# Patient Record
Sex: Male | Born: 1940 | Race: White | Hispanic: No | Marital: Married | Smoking: Former smoker
Health system: Southern US, Community
[De-identification: ages and names within clinical notes are randomized; demographics above are authoritative.]

## PROBLEM LIST (undated history)

## (undated) DIAGNOSIS — N289 Disorder of kidney and ureter, unspecified: Secondary | ICD-10-CM

## (undated) DIAGNOSIS — K644 Residual hemorrhoidal skin tags: Secondary | ICD-10-CM

## (undated) DIAGNOSIS — M109 Gout, unspecified: Secondary | ICD-10-CM

## (undated) DIAGNOSIS — L509 Urticaria, unspecified: Secondary | ICD-10-CM

## (undated) DIAGNOSIS — M25569 Pain in unspecified knee: Secondary | ICD-10-CM

## (undated) DIAGNOSIS — R0609 Other forms of dyspnea: Secondary | ICD-10-CM

## (undated) DIAGNOSIS — M1711 Unilateral primary osteoarthritis, right knee: Secondary | ICD-10-CM

## (undated) DIAGNOSIS — Z9981 Dependence on supplemental oxygen: Secondary | ICD-10-CM

## (undated) DIAGNOSIS — N183 Chronic kidney disease, stage 3 unspecified: Secondary | ICD-10-CM

## (undated) DIAGNOSIS — E119 Type 2 diabetes mellitus without complications: Secondary | ICD-10-CM

## (undated) DIAGNOSIS — N400 Enlarged prostate without lower urinary tract symptoms: Secondary | ICD-10-CM

## (undated) DIAGNOSIS — M79609 Pain in unspecified limb: Secondary | ICD-10-CM

## (undated) DIAGNOSIS — R7309 Other abnormal glucose: Secondary | ICD-10-CM

## (undated) DIAGNOSIS — E785 Hyperlipidemia, unspecified: Secondary | ICD-10-CM

## (undated) DIAGNOSIS — Z96652 Presence of left artificial knee joint: Secondary | ICD-10-CM

## (undated) DIAGNOSIS — H538 Other visual disturbances: Secondary | ICD-10-CM

## (undated) DIAGNOSIS — L2089 Other atopic dermatitis: Secondary | ICD-10-CM

## (undated) DIAGNOSIS — R21 Rash and other nonspecific skin eruption: Secondary | ICD-10-CM

## (undated) DIAGNOSIS — I10 Essential (primary) hypertension: Secondary | ICD-10-CM

## (undated) DIAGNOSIS — G4730 Sleep apnea, unspecified: Secondary | ICD-10-CM

## (undated) DIAGNOSIS — J9611 Chronic respiratory failure with hypoxia: Secondary | ICD-10-CM

## (undated) DIAGNOSIS — G4733 Obstructive sleep apnea (adult) (pediatric): Secondary | ICD-10-CM

## (undated) DIAGNOSIS — J849 Interstitial pulmonary disease, unspecified: Secondary | ICD-10-CM

## (undated) DIAGNOSIS — E669 Obesity, unspecified: Secondary | ICD-10-CM

## (undated) DIAGNOSIS — T7840XA Allergy, unspecified, initial encounter: Secondary | ICD-10-CM

## (undated) DIAGNOSIS — M1990 Unspecified osteoarthritis, unspecified site: Secondary | ICD-10-CM

## (undated) HISTORY — DX: Pain in unspecified knee: M25.569

## (undated) HISTORY — DX: Essential (primary) hypertension: I10

## (undated) HISTORY — DX: Obstructive sleep apnea (adult) (pediatric): G47.33

## (undated) HISTORY — DX: Type 2 diabetes mellitus without complications: E11.9

## (undated) HISTORY — DX: Other abnormal glucose: R73.09

## (undated) HISTORY — DX: Other atopic dermatitis: L20.89

## (undated) HISTORY — DX: Rash and other nonspecific skin eruption: R21

## (undated) HISTORY — DX: Benign prostatic hyperplasia without lower urinary tract symptoms: N40.0

## (undated) HISTORY — DX: Urticaria, unspecified: L50.9

## (undated) HISTORY — DX: Other visual disturbances: H53.8

## (undated) HISTORY — DX: Unspecified osteoarthritis, unspecified site: M19.90

## (undated) HISTORY — DX: Presence of left artificial knee joint: Z96.652

## (undated) HISTORY — DX: Residual hemorrhoidal skin tags: K64.4

## (undated) HISTORY — DX: Allergy, unspecified, initial encounter: T78.40XA

## (undated) HISTORY — DX: Sleep apnea, unspecified: G47.30

## (undated) HISTORY — DX: Chronic kidney disease, stage 3 unspecified: N18.30

## (undated) HISTORY — DX: Chronic respiratory failure with hypoxia: J96.11

## (undated) HISTORY — PX: CATARACT EXTRACTION: SUR2

## (undated) HISTORY — DX: Pain in unspecified limb: M79.609

## (undated) HISTORY — DX: Obesity, unspecified: E66.9

## (undated) HISTORY — DX: Interstitial pulmonary disease, unspecified: J84.9

## (undated) HISTORY — DX: Gout, unspecified: M10.9

## (undated) HISTORY — DX: Chronic kidney disease, stage 3 (moderate): N18.3

## (undated) HISTORY — DX: Hyperlipidemia, unspecified: E78.5

## (undated) HISTORY — DX: Other forms of dyspnea: R06.09

## (undated) HISTORY — DX: Unilateral primary osteoarthritis, right knee: M17.11

## (undated) HISTORY — DX: Disorder of kidney and ureter, unspecified: N28.9

## (undated) HISTORY — PX: JOINT REPLACEMENT: SHX530

---

## 2005-12-16 ENCOUNTER — Ambulatory Visit (HOSPITAL_BASED_OUTPATIENT_CLINIC_OR_DEPARTMENT_OTHER): Admission: RE | Admit: 2005-12-16 | Discharge: 2005-12-16 | Payer: Self-pay | Admitting: Emergency Medicine

## 2005-12-22 ENCOUNTER — Ambulatory Visit: Payer: Self-pay | Admitting: Internal Medicine

## 2007-06-21 ENCOUNTER — Emergency Department (HOSPITAL_COMMUNITY): Admission: EM | Admit: 2007-06-21 | Discharge: 2007-06-21 | Payer: Self-pay | Admitting: Emergency Medicine

## 2007-06-23 ENCOUNTER — Emergency Department (HOSPITAL_COMMUNITY): Admission: EM | Admit: 2007-06-23 | Discharge: 2007-06-23 | Payer: Self-pay | Admitting: Emergency Medicine

## 2008-06-03 ENCOUNTER — Encounter: Admission: RE | Admit: 2008-06-03 | Discharge: 2008-06-03 | Payer: Self-pay | Admitting: *Deleted

## 2008-07-14 ENCOUNTER — Encounter: Admission: RE | Admit: 2008-07-14 | Discharge: 2008-07-14 | Payer: Self-pay | Admitting: Internal Medicine

## 2008-09-10 ENCOUNTER — Encounter: Admission: RE | Admit: 2008-09-10 | Discharge: 2008-09-10 | Payer: Self-pay | Admitting: Internal Medicine

## 2010-09-15 NOTE — Procedures (Signed)
NAMEWEBB, WEED               ACCOUNT NO.:  [id number]   MEDICAL RECORD NO.:  [id number]          PATIENT TYPE:  OUT   LOCATION:  SLEEP CENTER                 FACILITY:  [facility]   PHYSICIAN:  Clinton D. Maple Hudson, MD, FCCP, FACPDATE OF BIRTH:  01-23-1940   DATE OF STUDY:  12/16/2005                              NOCTURNAL POLYSOMNOGRAM   REFERRING PHYSICIAN:  Dr. Leslee Home   INDICATION FOR STUDY:  Hypersomnia with sleep apnea.   EPWORTH SLEEPINESS SCORE:  13/24, BMI 38.  Weight 195 pounds.   HOME MEDICATION:  1. Detrol.  2. Syntest h.s.  3. Diclofenac.  4. Claritin.  5. Vitamins.  6. Caltrate.   SLEEP ARCHITECTURE:  Total sleep time 327 minutes with sleep efficiency 75%.  Stage I was 10%, stage II 68%, stages III and IV were absent.  REM 22% of  total sleep time.  Sleep latency 3 minutes, REM latency 71 minutes, awake  after sleep onset 93 minutes.  Arousal index increased at 45.8.  No bedtime  medication was reported.   RESPIRATORY DATA:  Split study protocol.  Apnea/hypopnea index (AHI, RDI)  103 obstructive events per hour indicating severe obstructive sleep apnea  before CPAP.  This included 219 obstructive apneas and 7 hypopneas before  CPAP.  Events were significantly more common while supine, but also present  in lateral sleep positions.  REM AHI 25.3.  CPAP was titrated to 14 CWP, AHI  4.1 per hour.  A medium full-face Mirage Quattro mask was used with a heated  humidifier.   OXYGEN DATA:  Moderately loud snoring with oxygen desaturation to a nadir of  59% before CPAP.  After CPAP control saturation held 95-96% on room air.   CARDIAC DATA:  Sinus bradycardia with heart rate 43-55 beats per minute.  No  significant ectopics.   MOVEMENTS/PARASOMNIA:  Occasional limb jerk with insignificant effect on  sleep.   IMPRESSION/RECOMMENDATIONS:  1. Severe obstructive sleep apnea/hypopnea syndrome, AHI 103 per hour with      events more common while supine but also noted  in lateral sleep      positions.  Moderately loud snoring with oxygen desaturation to a nadir      of 59%.  2. Successful CPAP titration to 14 CWP, AHI 4.1 per hour.  A medium full-      face Mirage Quattro mask was used with a heated humidifier.      Clinton D. Maple Hudson, MD, [facility], FACP  Diplomate, Biomedical engineer of Sleep Medicine  Electronically Signed     CDY/MEDQ  D:  12/22/2005 14:14:57  T:  12/23/2005 23:26:17  Job:  [id number]

## 2010-09-28 ENCOUNTER — Ambulatory Visit
Admission: RE | Admit: 2010-09-28 | Discharge: 2010-09-28 | Disposition: A | Discharge status: Home / Self Care | Payer: Medicare Other | Source: Ambulatory Visit | Attending: Internal Medicine | Admitting: Internal Medicine

## 2010-09-28 ENCOUNTER — Other Ambulatory Visit: Payer: Self-pay | Admitting: Internal Medicine

## 2010-09-28 DIAGNOSIS — M25562 Pain in left knee: Secondary | ICD-10-CM

## 2010-11-07 ENCOUNTER — Encounter: Payer: Medicare Other | Admitting: Oncology

## 2011-08-27 ENCOUNTER — Other Ambulatory Visit (HOSPITAL_BASED_OUTPATIENT_CLINIC_OR_DEPARTMENT_OTHER): Payer: Self-pay | Admitting: Internal Medicine

## 2011-08-27 DIAGNOSIS — N1330 Unspecified hydronephrosis: Secondary | ICD-10-CM

## 2011-08-29 ENCOUNTER — Ambulatory Visit
Admission: RE | Admit: 2011-08-29 | Discharge: 2011-08-29 | Disposition: A | Discharge status: Home / Self Care | Payer: Medicare Other | Source: Ambulatory Visit | Attending: Internal Medicine | Admitting: Internal Medicine

## 2011-08-29 DIAGNOSIS — N1330 Unspecified hydronephrosis: Secondary | ICD-10-CM

## 2012-10-02 ENCOUNTER — Ambulatory Visit (INDEPENDENT_AMBULATORY_CARE_PROVIDER_SITE_OTHER): Payer: Medicare Other | Admitting: Internal Medicine

## 2012-10-02 ENCOUNTER — Encounter: Payer: Self-pay | Admitting: Internal Medicine

## 2012-10-02 VITALS — BP 140/80 | HR 72 | Temp 98.2°F | Resp 18 | Ht 65.0 in | Wt 176.0 lb

## 2012-10-02 DIAGNOSIS — R21 Rash and other nonspecific skin eruption: Secondary | ICD-10-CM | POA: Insufficient documentation

## 2012-10-02 HISTORY — DX: Rash and other nonspecific skin eruption: R21

## 2012-10-02 MED ORDER — PREDNISONE (PAK) 10 MG PO TABS: 10.0000 mg | ORAL_TABLET | Freq: Every day | ORAL | Status: DC

## 2012-10-02 NOTE — Progress Notes (Signed)
Patient ID: Stanley Sullivan, male   DOB: 1940/09/05, 72 y.o.   MRN: [id number]  No Known Allergies  Chief Complaint  Patient presents with  . Acute Visit    rash all over for one week - since cutting grass    HPI: Patient is a 72 y.o.  seen in the office today for an acute visit due to a rash. No problems in [place] with dry dusty environment Several years ago (3-4)  had allergy testing when got rash all over with severe itching and swelling of eyes No findings  Put grass with hands into bag after mowing This time allergy medicine--altrec otc did not work this time  Review of Systems:  Review of Systems  Constitutional: Negative for fever, chills and malaise/fatigue.  HENT: Negative for sore throat.        Hoarse, had swelling of tongue  Eyes: Negative for blurred vision.       Puffiness of eyes  Respiratory: Negative for cough, shortness of breath and wheezing.   Cardiovascular: Negative for chest pain and palpitations.  Gastrointestinal: Negative for heartburn.  Skin: Positive for itching and rash.  Neurological: Negative for loss of consciousness and headaches.  Endo/Heme/Allergies: Positive for environmental allergies.     Past Medical History  Diagnosis Date  . Osteoarthrosis, unspecified whether generalized or localized, unspecified site   . Pain in joint, lower leg   . Chronic kidney disease, stage III (moderate)   . Other specified visual disturbances   . Unspecified disorder of kidney and ureter   . Other abnormal glucose   . Obesity, unspecified   . Urticaria, unspecified   . Allergy, unspecified not elsewhere classified   . Pain in limb   . Gout, unspecified   . Other and unspecified hyperlipidemia   . External hemorrhoids without mention of complication   . Organic sleep apnea, unspecified   . Unspecified essential hypertension   . Hypertrophy of prostate without urinary obstruction and other lower urinary tract symptoms (LUTS)   . Other atopic dermatitis  and related conditions    No past surgical history on file. Social History:   reports that he has never smoked. He does not have any smokeless tobacco history on file. His alcohol and drug histories are not on file.  History reviewed. No pertinent family history.  Medications: Patient's Medications  New Prescriptions   No medications on file  Previous Medications   ACETAMINOPHEN (TYLENOL) 500 MG TABLET    Take 500 mg by mouth every 6 (six) hours as needed for pain.   ALLOPURINOL (ZYLOPRIM) 100 MG TABLET    Take 100 mg by mouth 2 (two) times daily.   ATENOLOL (TENORMIN) 100 MG TABLET       CLOBETASOL OINTMENT (TEMOVATE) 0.05 %    Apply 1 application topically 2 (two) times daily.   COLCHICINE 0.6 MG TABLET    Take 0.6 mg by mouth daily. Take one tablet twice daily for gout.   FLUTICASONE (FLONASE) 50 MCG/ACT NASAL SPRAY    Place 1 spray into the nose 2 (two) times daily.   FUROSEMIDE (LASIX) 20 MG TABLET       LIDOCAINE (LIDODERM) 5 %    Place 1 patch onto the skin daily. Remove & Discard patch within 12 hours or as directed by MD   SIMVASTATIN (ZOCOR) 40 MG TABLET    Take 40 mg by mouth every evening.  Modified Medications   No medications on file  Discontinued Medications   No  medications on file   Physical Exam:  Filed Vitals:   10/02/12 0835  BP: 140/80  Pulse: 72  Temp: 98.2 F (36.8 C)  TempSrc: Oral  Resp: 18  Height: 5\' 5"  (1.651 m)  Weight: 176 lb (79.833 kg)  SpO2: 90%   Physical Exam  Constitutional: He is oriented to person, place, and time. He appears well-developed and well-nourished. No distress.  HENT:  Head: Normocephalic and atraumatic.  Eyes: Pupils are equal, round, and reactive to light.  Cardiovascular: Normal rate, regular rhythm, normal heart sounds and intact distal pulses.   Pulmonary/Chest: Effort normal and breath sounds normal. He has no wheezes.  Abdominal: Bowel sounds are normal.  Musculoskeletal: Normal range of motion.   Lymphadenopathy:    He has no cervical adenopathy.  Neurological: He is alert and oriented to person, place, and time.  Skin: Skin is warm and dry. Rash noted.  Maculopapular rash of lower aspect of back, abdomen, extremities, neck, swelling of lips has dissipated, periocular edema, no adenopathy, no oral lesions, evidence of excoriation in areas he can reach  Psychiatric: He has a normal mood and affect. His behavior is normal. Judgment and thought content normal.   Assessment/Plan 1. Maculopapular rash, generalized - Ambulatory referral to Allergy - predniSONE (STERAPRED UNI-PAK) 10 MG tablet; Take 1 tablet (10 mg total) by mouth daily. Use as directed on package  Dispense: 21 tablet; Refill: 0

## 2012-10-02 NOTE — Patient Instructions (Signed)
We have arranged an appointment for you with [facility], but they could not see you today and wanted you to be off antihistamines for at least 3 days beforehand.  I will treat you with a prednisone taper which we faxed to your pharmacy.

## 2012-10-02 NOTE — Assessment & Plan Note (Signed)
Had previous allergy testing 3-4 years ago after a similar episode.  Has been using otc antihistamine without benefit this time.  Was arranged for visit with [facility].  Will be off antihistamines for at least 3 days prior.  I will begin him on a prednisone taper with 10mg  tablets due to the severity of this reaction in the meantime.

## 2012-11-12 ENCOUNTER — Other Ambulatory Visit: Payer: Self-pay | Admitting: Internal Medicine

## 2012-12-10 ENCOUNTER — Other Ambulatory Visit: Payer: Medicare Other

## 2012-12-10 ENCOUNTER — Other Ambulatory Visit: Payer: Self-pay | Admitting: *Deleted

## 2012-12-10 DIAGNOSIS — I10 Essential (primary) hypertension: Secondary | ICD-10-CM

## 2012-12-10 DIAGNOSIS — N400 Enlarged prostate without lower urinary tract symptoms: Secondary | ICD-10-CM

## 2012-12-10 DIAGNOSIS — E785 Hyperlipidemia, unspecified: Secondary | ICD-10-CM

## 2012-12-11 ENCOUNTER — Encounter: Payer: Self-pay | Admitting: *Deleted

## 2012-12-11 LAB — CBC WITH DIFFERENTIAL/PLATELET
Basophils Absolute: 0 10*3/uL (ref 0.0–0.2)
Basos: 0 % (ref 0–3)
Eos: 4 % (ref 0–5)
Eosinophils Absolute: 0.4 10*3/uL (ref 0.0–0.4)
HCT: 43.5 % (ref 37.5–51.0)
Hemoglobin: 13.9 g/dL (ref 12.6–17.7)
Immature Grans (Abs): 0 10*3/uL (ref 0.0–0.1)
Immature Granulocytes: 0 % (ref 0–2)
Lymphocytes Absolute: 2.2 10*3/uL (ref 0.7–3.1)
Lymphs: 26 % (ref 14–46)
MCH: 23.3 pg — ABNORMAL LOW (ref 26.6–33.0)
MCHC: 32 g/dL (ref 31.5–35.7)
MCV: 73 fL — ABNORMAL LOW (ref 79–97)
Monocytes Absolute: 0.7 10*3/uL (ref 0.1–0.9)
Monocytes: 9 % (ref 4–12)
Neutrophils Absolute: 5.1 10*3/uL (ref 1.4–7.0)
Neutrophils Relative %: 61 % (ref 40–74)
RBC: 5.97 x10E6/uL — ABNORMAL HIGH (ref 4.14–5.80)
RDW: 16 % — ABNORMAL HIGH (ref 12.3–15.4)
WBC: 8.4 10*3/uL (ref 3.4–10.8)

## 2012-12-11 LAB — COMPREHENSIVE METABOLIC PANEL
ALT: 6 IU/L (ref 0–44)
AST: 14 IU/L (ref 0–40)
Albumin/Globulin Ratio: 1.5 (ref 1.1–2.5)
Albumin: 3.7 g/dL (ref 3.5–4.8)
Alkaline Phosphatase: 58 IU/L (ref 39–117)
BUN/Creatinine Ratio: 12 (ref 10–22)
BUN: 21 mg/dL (ref 8–27)
CO2: 23 mmol/L (ref 18–29)
Calcium: 9 mg/dL (ref 8.6–10.2)
Chloride: 104 mmol/L (ref 97–108)
Creatinine, Ser: 1.74 mg/dL — ABNORMAL HIGH (ref 0.76–1.27)
GFR calc Af Amer: 44 mL/min/{1.73_m2} — ABNORMAL LOW (ref >59)
GFR calc non Af Amer: 38 mL/min/{1.73_m2} — ABNORMAL LOW (ref >59)
Globulin, Total: 2.5 g/dL (ref 1.5–4.5)
Glucose: 94 mg/dL (ref 65–99)
Potassium: 5 mmol/L (ref 3.5–5.2)
Sodium: 140 mmol/L (ref 134–144)
Total Bilirubin: 0.4 mg/dL (ref 0.0–1.2)
Total Protein: 6.2 g/dL (ref 6.0–8.5)

## 2012-12-11 LAB — LIPID PANEL
Chol/HDL Ratio: 3.8 ratio units (ref 0.0–5.0)
Cholesterol, Total: 165 mg/dL (ref 100–199)
HDL: 44 mg/dL (ref >39)
LDL Calculated: 109 mg/dL — ABNORMAL HIGH (ref 0–99)
Triglycerides: 61 mg/dL (ref 0–149)
VLDL Cholesterol Cal: 12 mg/dL (ref 5–40)

## 2012-12-11 LAB — PSA: PSA: 2.8 ng/mL (ref 0.0–4.0)

## 2012-12-12 ENCOUNTER — Encounter: Payer: Self-pay | Admitting: Internal Medicine

## 2012-12-12 ENCOUNTER — Encounter: Payer: Self-pay | Admitting: *Deleted

## 2012-12-12 ENCOUNTER — Ambulatory Visit (INDEPENDENT_AMBULATORY_CARE_PROVIDER_SITE_OTHER): Payer: Medicare Other | Admitting: Internal Medicine

## 2012-12-12 VITALS — BP 140/68 | HR 58 | Temp 97.4°F | Resp 18 | Ht 67.0 in | Wt 175.6 lb

## 2012-12-12 DIAGNOSIS — I10 Essential (primary) hypertension: Secondary | ICD-10-CM | POA: Insufficient documentation

## 2012-12-12 DIAGNOSIS — M1710 Unilateral primary osteoarthritis, unspecified knee: Secondary | ICD-10-CM

## 2012-12-12 DIAGNOSIS — IMO0002 Reserved for concepts with insufficient information to code with codable children: Secondary | ICD-10-CM

## 2012-12-12 DIAGNOSIS — M1990 Unspecified osteoarthritis, unspecified site: Secondary | ICD-10-CM

## 2012-12-12 DIAGNOSIS — M109 Gout, unspecified: Secondary | ICD-10-CM

## 2012-12-12 DIAGNOSIS — E785 Hyperlipidemia, unspecified: Secondary | ICD-10-CM | POA: Insufficient documentation

## 2012-12-12 DIAGNOSIS — N183 Chronic kidney disease, stage 3 unspecified: Secondary | ICD-10-CM

## 2012-12-12 DIAGNOSIS — E669 Obesity, unspecified: Secondary | ICD-10-CM

## 2012-12-12 DIAGNOSIS — M1712 Unilateral primary osteoarthritis, left knee: Secondary | ICD-10-CM

## 2012-12-12 DIAGNOSIS — N400 Enlarged prostate without lower urinary tract symptoms: Secondary | ICD-10-CM

## 2012-12-12 NOTE — Progress Notes (Signed)
Patient ID: Stanley Sullivan, male   DOB: October 24, 1940, 72 y.o.   MRN: [id number] Location:  [facility]  No Known Allergies  Chief Complaint  Patient presents with  . Annual Exam    pains in both knees    HPI: Patient is a 72 y.o. [place] male with a h/o htn, hyperlipidemia, low back pain, osteoarthritis, CKD and BPH was seen in the office today for his annual physical.  He is accompanied by his daughter who interprets some of what I say to him.  He does speak some [place].    An EKG was performed that showed sinus bradycardia at 48.  His pulse was 58 at check-in.  Doing ok.  Has been to [facility].  Had diffuse maculopapular rash--was treated with prednisone taper and had more allergy testing.  No clear allergy identified.  Maybe grass allergy.  He continues to mow grass.  Takes allergy tablet if starts to react.    Left knee and ankle are hurting.  Hurts all of the time.  Cannot walk due to bad pain.  Cannot sit on the floor.  Sometimes worse than others.  Worse if more active.  Sometimes knee will be swollen.  Sometimes tylenol, advil, aleve, etc as needed.  Does get increased pain in lower back bilaterally.  Is worse after up walking for a long time.  Standing long term hurts, also.   Sometimes pain will radiate down his left buttock and external thigh from his back.  Is not taking allopurinol or colchicine.    He is only taking bp (atenolol) and cholesterol pills (simvastatin 40) and flomax because he has trouble starting his stream.  Starts and stops and has to stand for a long time to go.  No pain.  Feels he has to go but cannot go.  Only using the flomax a couple times a week.  Has been to urology a long time ago.  Review of Systems:  Review of Systems  Constitutional: Negative for fever, chills and malaise/fatigue.  Eyes: Negative for blurred vision.  Respiratory: Negative for cough and shortness of breath.   Cardiovascular: Negative for chest  pain, palpitations and leg swelling.  Gastrointestinal: Negative for heartburn, constipation, blood in stool and melena.  Genitourinary: Negative for dysuria.  Musculoskeletal: Positive for back pain and joint pain. Negative for myalgias and falls.  Skin: Negative for rash.  Neurological: Negative for dizziness, focal weakness and headaches.  Endo/Heme/Allergies: Positive for environmental allergies. Does not bruise/bleed easily.  Psychiatric/Behavioral: Negative for depression and memory loss.     Past Medical History  Diagnosis Date  . Osteoarthrosis, unspecified whether generalized or localized, unspecified site   . Pain in joint, lower leg   . Chronic kidney disease, stage III (moderate)   . Other specified visual disturbances   . Unspecified disorder of kidney and ureter   . Other abnormal glucose   . Obesity, unspecified   . Urticaria, unspecified   . Allergy, unspecified not elsewhere classified   . Pain in limb   . Gout, unspecified   . Other and unspecified hyperlipidemia   . External hemorrhoids without mention of complication   . Organic sleep apnea, unspecified   . Unspecified essential hypertension   . Hypertrophy of prostate without urinary obstruction and other lower urinary tract symptoms (LUTS)   . Other atopic dermatitis and related conditions     No past surgical history on file.  Social History:   reports that  he has never smoked. He does not have any smokeless tobacco history on file. His alcohol and drug histories are not on file.  No family history on file.  Medications: Patient's Medications  New Prescriptions   No medications on file  Previous Medications   ACETAMINOPHEN (TYLENOL) 500 MG TABLET    Take 500 mg by mouth every 6 (six) hours as needed for pain. Take 1-2 tablets by mouth every 6-8 hours as needed for pain.   ALLOPURINOL (ZYLOPRIM) 100 MG TABLET    Take 100 mg by mouth 2 (two) times daily. Take 2 tablets twice daily for gout.    AMLODIPINE (NORVASC) 5 MG TABLET    Take 5 mg by mouth daily. Take 1 tablet daily for blood pressure.   ATENOLOL (TENORMIN) 100 MG TABLET    Take 100 mg by mouth daily. Take 1 tablet once daily for BP.   CETIRIZINE (ZYRTEC) 10 MG TABLET    Take 10 mg by mouth daily. Take 1 tablet as needed for allergies.   CLOBETASOL OINTMENT (TEMOVATE) 0.05 %    Apply 1 application topically 2 (two) times daily. Apply twice a day on right leg rash.   COLCHICINE 0.6 MG TABLET    Take 0.6 mg by mouth daily. Take one tablet twice daily for gout.   DEXTROMETHORPHAN-GUAIFENESIN (MUCINEX DM MAXIMUM STRENGTH) 60-1200 MG TB12    Take 1 tablet by mouth every 12 (twelve) hours. Take 1 tablet every 12 hours.   DOCUSATE SODIUM (COLACE) 100 MG CAPSULE    Take 100 mg by mouth 2 (two) times daily. Take 1 capsule by mouth twice daily.   FLUTICASONE (FLONASE) 50 MCG/ACT NASAL SPRAY    Place 1 spray into the nose 2 (two) times daily. One spray in each nostril twice daily.   FUROSEMIDE (LASIX) 20 MG TABLET    Take 20 mg by mouth daily. Take 1 pill by mouth once a day in the morning.   GLUCOSE BLOOD TEST STRIP    1 each by Other route as needed for other. Use as instructed   LANCETS (FREESTYLE) LANCETS    1 each by Other route as needed for other. Use as instructed   LIDOCAINE (LIDODERM) 5 %    Place 1 patch onto the skin daily. Remove & Discard patch within 12 hours or as directed by MD   PREDNISONE (STERAPRED UNI-PAK) 10 MG TABLET    Take 1 tablet (10 mg total) by mouth daily. Use as directed on package   PSEUDOEPHEDRINE-GUAIFENESIN [id number] MG TB12    Take by mouth. Take 1 tablet twice a day.   SENNOSIDES-DOCUSATE SODIUM (SENNA) 8.6-50 MG TABS    Take 1 tablet by mouth 2 (two) times daily. Take 1 tablet twice a daily.   SIMVASTATIN (ZOCOR) 40 MG TABLET    Take 40 mg by mouth every evening. Take 1 tablet daily for cholesterol.   TAMSULOSIN (FLOMAX) 0.4 MG CAPS    TAKE 1 CAPSULE BY MOUTH ONCE A DAY  Modified Medications   No  medications on file  Discontinued Medications   No medications on file     Physical Exam: Filed Vitals:   12/12/12 0854  BP: 140/68  Pulse: 58  Temp: 97.4 F (36.3 C)  TempSrc: Oral  Resp: 18  Height: 5\' 7"  (1.702 m)  Weight: 175 lb 9.6 oz (79.652 kg)  SpO2: 95%  Physical Exam  Constitutional: He is oriented to person, place, and time. He appears well-developed and well-nourished. No distress.  HENT:  Head: Normocephalic and atraumatic.  Right Ear: External ear normal.  Left Ear: External ear normal.  Nose: Nose normal.  Mouth/Throat: Oropharynx is clear and moist. No oropharyngeal exudate.  Eyes: Conjunctivae and EOM are normal. Pupils are equal, round, and reactive to light. No scleral icterus.  Neck: Normal range of motion. Neck supple. No JVD present. No tracheal deviation present. No thyromegaly present.  Cardiovascular: Normal rate, regular rhythm, normal heart sounds and intact distal pulses.   No murmur heard. Pulmonary/Chest: Effort normal and breath sounds normal. No respiratory distress.  Abdominal: Soft. Bowel sounds are normal. He exhibits no distension and no mass. There is no tenderness. There is no rebound and no guarding.  Musculoskeletal: Normal range of motion. He exhibits no edema and no tenderness.  Tenderness of left medial knee and medial ankle, tenderness over sacroiliac joints bilaterally   Lymphadenopathy:    He has no cervical adenopathy.  Neurological: He is alert and oriented to person, place, and time. He has normal reflexes. No cranial nerve deficit. Coordination normal.  Skin: Skin is warm and dry.  Psychiatric: He has a normal mood and affect. His behavior is normal. Judgment and thought content normal.     Labs reviewed: Basic Metabolic Panel:  Recent Labs  09/81/19 0858  NA 140  K 5.0  CL 104  CO2 23  GLUCOSE 94  BUN 21  CREATININE 1.74*  CALCIUM 9.0   Liver Function Tests:  Recent Labs  12/10/12 0858  AST 14  ALT 6   ALKPHOS 58  BILITOT 0.4  PROT 6.2  CBC:  Recent Labs  12/10/12 0858  WBC 8.4  NEUTROABS 5.1  HGB 13.9  HCT 43.5  MCV 73*   Lipid Panel:  Recent Labs  12/10/12 0858  HDL 44  LDLCALC 109*  TRIG 61  CHOLHDL 3.8   Past Procedures: Has had prior knee imaging, but several years ago and I could not actually view the images  Assessment/Plan 1. Chronic kidney disease, stage III (moderate) -stable -avoid nsaids other than baby asa -use tylenol instead for arthritis pain  2. Hypertrophy of prostate without urinary obstruction and other lower urinary tract symptoms (LUTS) - Ambulatory referral to Urology -BPH worsening, very gradual increase in PSA over the past 4 years, but not alarmingly high -has not been adherent with flomax--emphasized importance of daily use to decrease symptoms  3. Unspecified essential hypertension At goal with atenolol alone--may actually do better with a different agent due to bradycardia, but he is asymptomatic from this so continued  4. Other and unspecified hyperlipidemia Lipids just above goal-encouraged to take medication each night  5. Osteoarthritis of left knee -  Worse recently--imaging ordered, may need steroid injection - DG Knee Complete 4 Views Left; Future - DG Ankle Complete Left; Future  6. Gout - has stopped his gout medicines so I will f/u levels - DG Ankle Complete Left; Future - Uric Acid; Future - Sedimentation Rate; Future  Labs/tests ordered:  Uric acid, ESR before next visit or day of is ok Next appt:  1 month

## 2012-12-12 NOTE — Patient Instructions (Addendum)
Please get xrays of left ankle and knee.    This is at [facility] at [address] [facility] [address]. I have also done referral for your difficulty peeing--to urology.

## 2013-01-07 ENCOUNTER — Other Ambulatory Visit: Payer: Self-pay | Admitting: Internal Medicine

## 2013-01-07 ENCOUNTER — Telehealth: Payer: Self-pay

## 2013-01-07 DIAGNOSIS — M1711 Unilateral primary osteoarthritis, right knee: Secondary | ICD-10-CM

## 2013-01-07 DIAGNOSIS — N189 Chronic kidney disease, unspecified: Secondary | ICD-10-CM

## 2013-01-07 NOTE — Telephone Encounter (Signed)
Pt was referred to urology.  There was no [place] urologist at [facility], however.  Also, xrays were ordered of the left knee and ankle b/c that was what he complained of that day.  I thought that his daughter was notified several weeks ago of the urology appt.

## 2013-01-07 NOTE — Telephone Encounter (Signed)
Left message on voicemail for patient/wife with Dr.Reeds comments, patient/wife instructed to call if questions or concerns

## 2013-01-07 NOTE — Telephone Encounter (Signed)
Patient's wife walked into the office to question why patient was not referred to [facility], patient would like a referral at this time. Patient's wife also questions why xray were not ordered for both Knee's/Ankles, patient c/o of Bilateral pain (see ov noted 12/12/12). Patient would like orders placed for the imaging center on [facility]   Dr.Reed please advise

## 2013-01-08 ENCOUNTER — Ambulatory Visit
Admission: RE | Admit: 2013-01-08 | Discharge: 2013-01-08 | Disposition: A | Discharge status: Home / Self Care | Payer: Medicare Other | Source: Ambulatory Visit | Attending: Internal Medicine | Admitting: Internal Medicine

## 2013-01-08 DIAGNOSIS — M1712 Unilateral primary osteoarthritis, left knee: Secondary | ICD-10-CM

## 2013-01-08 DIAGNOSIS — M109 Gout, unspecified: Secondary | ICD-10-CM

## 2013-01-08 DIAGNOSIS — M1711 Unilateral primary osteoarthritis, right knee: Secondary | ICD-10-CM

## 2013-03-04 ENCOUNTER — Encounter: Payer: Self-pay | Admitting: Internal Medicine

## 2013-03-04 ENCOUNTER — Ambulatory Visit (INDEPENDENT_AMBULATORY_CARE_PROVIDER_SITE_OTHER): Payer: Medicare Other | Admitting: Internal Medicine

## 2013-03-04 VITALS — BP 160/76 | HR 65 | Temp 97.1°F | Wt 178.4 lb

## 2013-03-04 DIAGNOSIS — R21 Rash and other nonspecific skin eruption: Secondary | ICD-10-CM

## 2013-03-04 MED ORDER — PREDNISONE 50 MG PO TABS: 50.0000 mg | ORAL_TABLET | Freq: Every day | ORAL | Status: DC

## 2013-03-04 NOTE — Progress Notes (Signed)
Patient ID: Stanley Sullivan, male   DOB: 09-26-1940, 72 y.o.   MRN: [id number]  Chief Complaint  Patient presents with  . Acute Visit    rash all over body x 4-5 days, no changes in body soaps or washing powders    HPI:  72 y.o. male patient is seen in the office today for an acute visit due to a rash. He noticed generalized rash in his arm and trunk 4 days back. He was doing yard work and picking up dry leaves. He had similar rash in June this year and had seen allergy specialist and is taking zyrtec on daily basis He has intense pruritis. The rash has spread to his legs and scalp. No face or mucosal involvement No drainage reported No difficulty breathing or swallowing  Review of Systems:  Constitutional: Negative for fever, chills and malaise/fatigue.  HENT: Negative for sore throat.   Eyes: Negative for blurred vision.   Respiratory: Negative for cough, shortness of breath and wheezing.   Cardiovascular: Negative for chest pain and palpitations.  Gastrointestinal: Negative for heartburn.  Skin: Positive for itching  Neurological: Negative for loss of consciousness and headaches.  Endo/Heme/Allergies: Positive for environmental allergies.   Past Medical History  Diagnosis Date  . Osteoarthrosis, unspecified whether generalized or localized, unspecified site   . Pain in joint, lower leg   . Chronic kidney disease, stage III (moderate)   . Other specified visual disturbances   . Unspecified disorder of kidney and ureter   . Other abnormal glucose   . Obesity, unspecified   . Urticaria, unspecified   . Allergy, unspecified not elsewhere classified   . Pain in limb   . Gout, unspecified   . Other and unspecified hyperlipidemia   . External hemorrhoids without mention of complication   . Organic sleep apnea, unspecified   . Unspecified essential hypertension   . Hypertrophy of prostate without urinary obstruction and other lower urinary tract symptoms (LUTS)   . Other atopic  dermatitis and related conditions    Medication reviewed. See MAR   Physical Exam   BP 160/76  Pulse 65  Temp(Src) 97.1 F (36.2 C) (Oral)  Wt 178 lb 6.4 oz (80.922 kg)  SpO2 96%  Constitutional: He is oriented to person, place, and time. He appears well-developed and well-nourished. No distress.  HENT:   Head: Normocephalic and atraumatic.  Mouth: normal oropharynx, no tongue swelling, no enlarged lymph node Eyes: Pupils are equal, round, and reactive to light.  Cardiovascular: Normal rate, regular rhythm, normal heart sounds and intact distal pulses.   Pulmonary/Chest: Effort normal and breath sounds normal. He has no wheezes, rhonchi Abdominal: Bowel sounds are normal.  Musculoskeletal: Normal range of motion.  Lymphadenopathy:    He has no cervical adenopathy.  Neurological: He is alert and oriented to person, place, and time.  Skin: Skin is warm and dry.  Maculopapular rash on arms, legs, trunk, chest, scalp. No lip swelling. No periorbital and genital area rash. No oral lesions. No evidence of excoriation in areas he can reach  Psychiatric: He has a normal mood and affect. His behavior is normal. Judgment and thought content normal.   Assessment/Plan  Maculopapular rash, generalized Likely from contact dermatitis or poison ivy given development after being outdoor gardening. Will have him on a week course of prednisone 50 mg daily and continue levocetirizine. Reassess if no improvement. Avoid outdoor activities- esp gardening for now

## 2013-03-09 ENCOUNTER — Ambulatory Visit (INDEPENDENT_AMBULATORY_CARE_PROVIDER_SITE_OTHER): Payer: Medicare Other | Admitting: Internal Medicine

## 2013-03-09 ENCOUNTER — Ambulatory Visit: Payer: Medicare Other | Admitting: Internal Medicine

## 2013-03-09 ENCOUNTER — Encounter: Payer: Self-pay | Admitting: Internal Medicine

## 2013-03-09 VITALS — BP 148/86 | HR 53 | Temp 98.1°F | Wt 180.0 lb

## 2013-03-09 DIAGNOSIS — E119 Type 2 diabetes mellitus without complications: Secondary | ICD-10-CM

## 2013-03-09 DIAGNOSIS — R0609 Other forms of dyspnea: Secondary | ICD-10-CM

## 2013-03-09 DIAGNOSIS — J309 Allergic rhinitis, unspecified: Secondary | ICD-10-CM

## 2013-03-09 DIAGNOSIS — L247 Irritant contact dermatitis due to plants, except food: Secondary | ICD-10-CM

## 2013-03-09 DIAGNOSIS — R0600 Dyspnea, unspecified: Secondary | ICD-10-CM

## 2013-03-09 DIAGNOSIS — L255 Unspecified contact dermatitis due to plants, except food: Secondary | ICD-10-CM

## 2013-03-09 MED ORDER — GLUCOSE BLOOD VI STRP: ORAL_STRIP | Status: DC

## 2013-03-09 NOTE — Progress Notes (Signed)
Patient ID: Stanley Sullivan, male   DOB: [birth year], 72 y.o.   MRN: [id number] Location:  [facility]   No Known Allergies  Chief Complaint  Patient presents with  . Allergies    patient c/o of allergies     HPI: Patient is a 72 y.o. male seen in the office today for c/o of allergies. Came in 11/5 for a rash and saw Dr. Glade Lloyd. It was thought that it was contact dermatitis due to poison ivy. Prednisone 50mg  for 7 days was prescribed. He is only taking 3 tablets per day.  They are back today because he voice is raspy/hoarse. Was concerned that it was the new medication. Daughter states that's that this hoarsness has happened before and he had a full workup and they did not find the reason. She believes it might be due to allergies. He does not have the rash today that he did on 11/5--has resolved.   Review of Systems:  Review of Systems  Constitutional: Negative for fever, chills and malaise/fatigue.  HENT: Negative for congestion and sore throat.        Denies difficulty swallowing; hoarse  Respiratory: Positive for shortness of breath. Negative for wheezing.        Denies dyspnea  Cardiovascular: Negative for chest pain and leg swelling.  Gastrointestinal: Negative for abdominal pain.  Genitourinary: Positive for frequency.  Musculoskeletal: Negative for falls.  Skin: Negative for itching and rash.  Psychiatric/Behavioral: Negative for depression.     Past Medical History  Diagnosis Date  . Osteoarthrosis, unspecified whether generalized or localized, unspecified site   . Pain in joint, lower leg   . Chronic kidney disease, stage III (moderate)   . Other specified visual disturbances   . Unspecified disorder of kidney and ureter   . Other abnormal glucose   . Obesity, unspecified   . Urticaria, unspecified   . Allergy, unspecified not elsewhere classified   . Pain in limb   . Gout, unspecified   . Other and unspecified hyperlipidemia    . External hemorrhoids without mention of complication   . Organic sleep apnea, unspecified   . Unspecified essential hypertension   . Hypertrophy of prostate without urinary obstruction and other lower urinary tract symptoms (LUTS)   . Other atopic dermatitis and related conditions     History reviewed. No pertinent past surgical history.  Social History:   reports that he has never smoked. He does not have any smokeless tobacco history on file. His alcohol and drug histories are not on file.  History reviewed. No pertinent family history.  Medications: Patient's Medications  New Prescriptions   No medications on file  Previous Medications   ACETAMINOPHEN (TYLENOL) 500 MG TABLET    Take 500 mg by mouth every 6 (six) hours as needed for pain. Take 1-2 tablets by mouth every 6-8 hours as needed for pain.   ATENOLOL (TENORMIN) 100 MG TABLET    Take 100 mg by mouth daily. Take 1 tablet once daily for BP.   CETIRIZINE (ZYRTEC) 10 MG TABLET    Take 10 mg by mouth daily. Take 1 tablet as needed for allergies.   CLOBETASOL OINTMENT (TEMOVATE) 0.05 %    Apply 1 application topically 2 (two) times daily. Apply twice a day on right leg rash.   DEXTROMETHORPHAN-GUAIFENESIN (MUCINEX DM MAXIMUM STRENGTH) 60-1200 MG TB12    Take 1 tablet by mouth every 12 (twelve) hours. Take 1 tablet every 12 hours.  DOCUSATE SODIUM (COLACE) 100 MG CAPSULE    Take 100 mg by mouth 2 (two) times daily. Take 1 capsule by mouth twice daily.   FLUTICASONE (FLONASE) 50 MCG/ACT NASAL SPRAY    Place 1 spray into the nose 2 (two) times daily. One spray in each nostril twice daily.   GLUCOSE BLOOD TEST STRIP    1 each by Other route as needed for other. Use as instructed   LANCETS (FREESTYLE) LANCETS    1 each by Other route as needed for other. Use as instructed   LEVOCETIRIZINE (XYZAL) 5 MG TABLET    Take 5 mg by mouth every evening.   MOMETASONE (ELOCON) 0.1 % CREAM    Apply 1 application topically daily.   PREDNISONE  (DELTASONE) 50 MG TABLET    Take 1 tablet (50 mg total) by mouth daily with breakfast.   SENNOSIDES-DOCUSATE SODIUM (SENNA) 8.6-50 MG TABS    Take 1 tablet by mouth 2 (two) times daily. Take 1 tablet twice a daily.   SIMVASTATIN (ZOCOR) 40 MG TABLET    Take 40 mg by mouth every evening. Take 1 tablet daily for cholesterol.   TAMSULOSIN (FLOMAX) 0.4 MG CAPS    TAKE 1 CAPSULE BY MOUTH ONCE A DAY  Modified Medications   No medications on file  Discontinued Medications   No medications on file     Physical Exam: Filed Vitals:   03/09/13 1529  BP: 148/86  Pulse: 53  Temp: 98.1 F (36.7 C)  TempSrc: Oral  Weight: 180 lb (81.647 kg)  SpO2: 88%   Physical Exam  Constitutional: He appears well-developed and well-nourished.  HENT:  Head: Normocephalic and atraumatic.  Mouth/Throat: Uvula is midline, oropharynx is clear and moist and mucous membranes are normal.  Voice is hoarse; no pharyngeal erythema, exudate  Eyes: Conjunctivae are normal. Right eye exhibits no discharge. Left eye exhibits no discharge.  Cardiovascular: Normal rate, regular rhythm, normal heart sounds and intact distal pulses.   Pulmonary/Chest: Effort normal and breath sounds normal. No stridor. No respiratory distress.  Musculoskeletal: Normal range of motion.  Lymphadenopathy:    He has no cervical adenopathy.  Neurological: He is alert.  Skin: Skin is warm and dry. No rash noted.  Psychiatric: He has a normal mood and affect.     Labs reviewed: Basic Metabolic Panel:  Recent Labs  45/40/98 0858  NA 140  K 5.0  CL 104  CO2 23  GLUCOSE 94  BUN 21  CREATININE 1.74*  CALCIUM 9.0   Liver Function Tests:  Recent Labs  12/10/12 0858  AST 14  ALT 6  ALKPHOS 58  BILITOT 0.4  PROT 6.2  CBC:  Recent Labs  12/10/12 0858  WBC 8.4  NEUTROABS 5.1  HGB 13.9  HCT 43.5  MCV 73*   Lipid Panel:  Recent Labs  12/10/12 0858  HDL 44  LDLCALC 109*  TRIG 61  CHOLHDL 3.8   Assessment/Plan 1.  Type II or unspecified type diabetes mellitus without mention of complication, not stated as uncontrolled -checks cbgs daily to a couple times a week depending on needs at the time -check hba1c at next regular visit in 3mos - glucose blood test strip; Test glucose daily  Dispense: 100 each; Refill: 3  2. Contact dermatitis and eczema due to plant Resolved with prednisone use 30mg  since last appt, but will now stop to prevent hyperglycemia and due to resolution  3. Allergic rhinitis Advised to stop otc cetirizine as he is on  similar agent through [facility]  4. Dyspnea on exertion -walking up stairs or exerting himself much per daughter, no associated chest pain, but they request to have him looked at by Dr. Jacinto Halim  - Ambulatory referral to Cardiology for a stress test  Labs/tests ordered:  Will do labs next time Next appt:  3 mos

## 2013-03-23 ENCOUNTER — Telehealth: Payer: Self-pay | Admitting: *Deleted

## 2013-03-23 NOTE — Telephone Encounter (Signed)
Spoke to pt's wife, Theresia Majors, she will bring him into the office one day next week for labs as well as schedule a f/u appt.

## 2013-03-23 NOTE — Telephone Encounter (Signed)
Message copied by Waymond Cera on Mon Mar 23, 2013  4:19 PM ------      Message from: [place], [place]      Created: Sun Mar 22, 2013  6:33 PM       Pt did not come if for labs that were ordered when he was seen in May. Please ask his daughter to bring him in for these.  She also did not make his routine follow up appointment.  It seems they only come when there is a problem and then there are numerous things to address.              ----- Message -----         From: SYSTEM         Sent: 03/19/2013  12:09 AM           To: Kermit Balo, DO                   ------

## 2013-03-31 ENCOUNTER — Other Ambulatory Visit: Payer: Medicare Other

## 2013-03-31 DIAGNOSIS — M109 Gout, unspecified: Secondary | ICD-10-CM

## 2013-04-01 LAB — URIC ACID: Uric Acid: 9.5 mg/dL — ABNORMAL HIGH (ref 3.7–8.6)

## 2013-04-01 LAB — SEDIMENTATION RATE: Sed Rate: 10 mm/hr (ref 0–30)

## 2013-04-03 ENCOUNTER — Other Ambulatory Visit: Payer: Self-pay | Admitting: Cardiology

## 2013-04-03 ENCOUNTER — Ambulatory Visit
Admission: RE | Admit: 2013-04-03 | Discharge: 2013-04-03 | Disposition: A | Discharge status: Home / Self Care | Payer: Medicare Other | Source: Ambulatory Visit | Attending: Cardiology | Admitting: Cardiology

## 2013-04-03 ENCOUNTER — Telehealth: Payer: Self-pay

## 2013-04-03 DIAGNOSIS — J8410 Pulmonary fibrosis, unspecified: Secondary | ICD-10-CM

## 2013-04-03 MED ORDER — ALLOPURINOL 100 MG PO TABS: 100.0000 mg | ORAL_TABLET | Freq: Every day | ORAL | Status: DC

## 2013-04-03 NOTE — Telephone Encounter (Signed)
Message copied by Maurice Small on Fri Apr 03, 2013  9:35 AM ------      Message from: [place], [place] L      Created: Thu Apr 02, 2013  4:21 PM       Uric acid level is elevated.  He is at high risk for gout recurrence.  I recommend he take allopurinol 100mg  daily to prevent gouty flares ------

## 2013-04-03 NOTE — Telephone Encounter (Signed)
Spoke with patient's wife, Mrs.Wauneka verbalized understanding of results. RX to be sent to [facility]

## 2013-04-10 ENCOUNTER — Encounter: Payer: Self-pay | Admitting: Internal Medicine

## 2013-04-10 ENCOUNTER — Ambulatory Visit (INDEPENDENT_AMBULATORY_CARE_PROVIDER_SITE_OTHER): Payer: Medicare Other | Admitting: Internal Medicine

## 2013-04-10 VITALS — BP 130/78 | HR 62 | Wt 178.0 lb

## 2013-04-10 DIAGNOSIS — K5900 Constipation, unspecified: Secondary | ICD-10-CM

## 2013-04-10 DIAGNOSIS — K5904 Chronic idiopathic constipation: Secondary | ICD-10-CM

## 2013-04-10 DIAGNOSIS — R0600 Dyspnea, unspecified: Secondary | ICD-10-CM

## 2013-04-10 DIAGNOSIS — M109 Gout, unspecified: Secondary | ICD-10-CM

## 2013-04-10 DIAGNOSIS — R0609 Other forms of dyspnea: Secondary | ICD-10-CM

## 2013-04-10 HISTORY — DX: Dyspnea, unspecified: R06.00

## 2013-04-10 HISTORY — DX: Other forms of dyspnea: R06.09

## 2013-04-10 MED ORDER — LINACLOTIDE 145 MCG PO CAPS: 145.0000 ug | ORAL_CAPSULE | Freq: Every day | ORAL | Status: DC

## 2013-04-10 NOTE — Progress Notes (Signed)
Patient ID: Stanley Sullivan, male   DOB: [birth year], 72 y.o.   MRN: [id number]   Location:  [facility]   No Known Allergies  Chief Complaint  Patient presents with  . Medical Managment of Chronic Issues    Follow-up to discuss labs (copy printed)  . Raised area    Patient c/o raised area near buttocks x 2-3 weeks     HPI: Patient is a 72 y.o. [place] male seen in the office today for f/u of chronic conditions to review labs--had very high uric acid and was not taking allopurinol or colcrys as I recommended. He had too much to drink over thanksgiving and that was trigger last time for gout.    Now has raised area near his buttocks for 2-3 wks.   Discussed creams for hemorrhoids.  Has tried colace, metamucil, miralax, senokot s.  Has tried it all and remains constipated.  Wants to try linzess   Review of Systems:  ROS   Past Medical History  Diagnosis Date  . Osteoarthrosis, unspecified whether generalized or localized, unspecified site   . Pain in joint, lower leg   . Chronic kidney disease, stage III (moderate)   . Other specified visual disturbances   . Unspecified disorder of kidney and ureter   . Other abnormal glucose   . Obesity, unspecified   . Urticaria, unspecified   . Allergy, unspecified not elsewhere classified   . Pain in limb   . Gout, unspecified   . Other and unspecified hyperlipidemia   . External hemorrhoids without mention of complication   . Organic sleep apnea, unspecified   . Unspecified essential hypertension   . Hypertrophy of prostate without urinary obstruction and other lower urinary tract symptoms (LUTS)   . Other atopic dermatitis and related conditions     History reviewed. No pertinent past surgical history.  Social History:   reports that he has never smoked. He does not have any smokeless tobacco history on file. His alcohol and drug histories are not on file.  History reviewed. No pertinent  family history.  Medications: Patient's Medications  New Prescriptions   No medications on file  Previous Medications   ACETAMINOPHEN (TYLENOL) 500 MG TABLET    Take 500 mg by mouth every 6 (six) hours as needed for pain. Take 1-2 tablets by mouth every 6-8 hours as needed for pain.   ALLOPURINOL (ZYLOPRIM) 100 MG TABLET    Take 1 tablet (100 mg total) by mouth daily.   ATENOLOL (TENORMIN) 100 MG TABLET    Take 100 mg by mouth daily. Take 1 tablet once daily for BP.   CETIRIZINE (ZYRTEC) 10 MG TABLET    Take 10 mg by mouth daily. Take 1 tablet as needed for allergies.   CLOBETASOL OINTMENT (TEMOVATE) 0.05 %    Apply 1 application topically 2 (two) times daily. Apply twice a day on right leg rash.   DEXTROMETHORPHAN-GUAIFENESIN (MUCINEX DM MAXIMUM STRENGTH) 60-1200 MG TB12    Take 1 tablet by mouth every 12 (twelve) hours. Take 1 tablet every 12 hours.   DOCUSATE SODIUM (COLACE) 100 MG CAPSULE    Take 100 mg by mouth 2 (two) times daily. Take 1 capsule by mouth twice daily.   FLUTICASONE (FLONASE) 50 MCG/ACT NASAL SPRAY    Place 1 spray into the nose 2 (two) times daily. One spray in each nostril twice daily.   GLUCOSE BLOOD TEST STRIP    Test glucose daily  LANCETS (FREESTYLE) LANCETS    1 each by Other route as needed for other. Use as instructed   LEVOCETIRIZINE (XYZAL) 5 MG TABLET    Take 5 mg by mouth every evening.   MOMETASONE (ELOCON) 0.1 % CREAM    Apply 1 application topically daily.   PREDNISONE (DELTASONE) 50 MG TABLET    Take 1 tablet (50 mg total) by mouth daily with breakfast.   SENNOSIDES-DOCUSATE SODIUM (SENNA) 8.6-50 MG TABS    Take 1 tablet by mouth 2 (two) times daily. Take 1 tablet twice a daily.   SIMVASTATIN (ZOCOR) 40 MG TABLET    Take 40 mg by mouth every evening. Take 1 tablet daily for cholesterol.   TAMSULOSIN (FLOMAX) 0.4 MG CAPS    TAKE 1 CAPSULE BY MOUTH ONCE A DAY  Modified Medications   No medications on file  Discontinued Medications   No medications on  file     Physical Exam: Filed Vitals:   04/10/13 0924  BP: 130/78  Pulse: 62  Weight: 178 lb (80.74 kg)  SpO2: 95%  Physical Exam  Labs reviewed: Basic Metabolic Panel:  Recent Labs  40/98/11 0858  NA 140  K 5.0  CL 104  CO2 23  GLUCOSE 94  BUN 21  CREATININE 1.74*  CALCIUM 9.0   Liver Function Tests:  Recent Labs  12/10/12 0858  AST 14  ALT 6  ALKPHOS 58  BILITOT 0.4  PROT 6.2  CBC:  Recent Labs  12/10/12 0858  WBC 8.4  NEUTROABS 5.1  HGB 13.9  HCT 43.5  MCV 73*   Lipid Panel:  Recent Labs  12/10/12 0858  HDL 44  LDLCALC 109*  TRIG 61  CHOLHDL 3.8   Lab Results  Component Value Date   URICACID 9.5* 03/31/2013   Lab Results  Component Value Date   ESRSEDRATE 10 03/31/2013   Assessment/Plan 1. Gout -advised when labs returned to begin colcrys and allopurinol -colcrys will be only for 6 mos and allopurinol indefinitely -needs to take them daily  2. Chronic idiopathic constipation -explained he must stop other constipation pills and take only the linzess on a daily basis--only stop it if he gets diarrhea -he has failed all other typical treatments - Linaclotide (LINZESS) 145 MCG CAPS capsule; Take 1 capsule (145 mcg total) by mouth daily.  Dispense: 30 capsule; Refill: 3  3. Dyspnea on exertion -has seen Dr. Jacinto Halim and had ekg, echo and now scheduled for stress test--await his notes on this  Next appt:  3 mos

## 2013-04-10 NOTE — Patient Instructions (Signed)
You may use anusol cream for hemorrhoids if they itch or become painful.  Please take the allopurinol, colcrys AND the new linzess medicine DAILY.  They will not work if you only take them as needed.

## 2013-04-13 ENCOUNTER — Ambulatory Visit: Payer: Medicare Other | Admitting: Internal Medicine

## 2013-04-24 ENCOUNTER — Encounter: Payer: Self-pay | Admitting: Internal Medicine

## 2013-05-12 ENCOUNTER — Other Ambulatory Visit: Payer: Self-pay | Admitting: *Deleted

## 2013-05-12 DIAGNOSIS — M25569 Pain in unspecified knee: Secondary | ICD-10-CM

## 2013-05-27 ENCOUNTER — Institutional Professional Consult (permissible substitution): Payer: Medicare Other | Admitting: Internal Medicine

## 2013-05-29 ENCOUNTER — Encounter: Payer: Self-pay | Admitting: Internal Medicine

## 2013-07-01 ENCOUNTER — Telehealth: Payer: Self-pay | Admitting: *Deleted

## 2013-07-01 NOTE — Telephone Encounter (Signed)
LMTCBx1 to reschedule appt that pt cancelled on 05-27-13. Carron CurieJennifer Castillo, CMA

## 2013-07-02 NOTE — Telephone Encounter (Signed)
LMTCBx2. Jennifer Castillo, CMA  

## 2013-07-14 NOTE — Telephone Encounter (Signed)
LMTCBx3. Jayana Kotula, CMA  

## 2013-08-01 ENCOUNTER — Other Ambulatory Visit: Payer: Self-pay | Admitting: Nurse Practitioner

## 2013-08-04 ENCOUNTER — Other Ambulatory Visit: Payer: Self-pay

## 2013-08-04 MED ORDER — ATENOLOL 100 MG PO TABS
ORAL_TABLET | ORAL | Status: DC
Start: 2013-08-04 — End: 2013-08-24

## 2013-08-04 MED ORDER — SIMVASTATIN 40 MG PO TABS: ORAL_TABLET | ORAL | Status: DC

## 2013-08-24 ENCOUNTER — Encounter: Payer: Self-pay | Admitting: Internal Medicine

## 2013-08-24 ENCOUNTER — Ambulatory Visit (INDEPENDENT_AMBULATORY_CARE_PROVIDER_SITE_OTHER): Payer: Medicare Other | Admitting: Internal Medicine

## 2013-08-24 VITALS — BP 150/82 | HR 60 | Temp 97.7°F | Wt 176.8 lb

## 2013-08-24 DIAGNOSIS — K5904 Chronic idiopathic constipation: Secondary | ICD-10-CM

## 2013-08-24 DIAGNOSIS — N400 Enlarged prostate without lower urinary tract symptoms: Secondary | ICD-10-CM | POA: Insufficient documentation

## 2013-08-24 DIAGNOSIS — M1712 Unilateral primary osteoarthritis, left knee: Secondary | ICD-10-CM | POA: Insufficient documentation

## 2013-08-24 DIAGNOSIS — E119 Type 2 diabetes mellitus without complications: Secondary | ICD-10-CM | POA: Insufficient documentation

## 2013-08-24 DIAGNOSIS — R0609 Other forms of dyspnea: Secondary | ICD-10-CM

## 2013-08-24 DIAGNOSIS — Z23 Encounter for immunization: Secondary | ICD-10-CM

## 2013-08-24 DIAGNOSIS — IMO0002 Reserved for concepts with insufficient information to code with codable children: Secondary | ICD-10-CM

## 2013-08-24 DIAGNOSIS — K5900 Constipation, unspecified: Secondary | ICD-10-CM

## 2013-08-24 DIAGNOSIS — R0989 Other specified symptoms and signs involving the circulatory and respiratory systems: Secondary | ICD-10-CM

## 2013-08-24 DIAGNOSIS — I10 Essential (primary) hypertension: Secondary | ICD-10-CM | POA: Insufficient documentation

## 2013-08-24 DIAGNOSIS — R0600 Dyspnea, unspecified: Secondary | ICD-10-CM

## 2013-08-24 DIAGNOSIS — M1710 Unilateral primary osteoarthritis, unspecified knee: Secondary | ICD-10-CM

## 2013-08-24 DIAGNOSIS — M109 Gout, unspecified: Secondary | ICD-10-CM | POA: Insufficient documentation

## 2013-08-24 HISTORY — DX: Benign prostatic hyperplasia without lower urinary tract symptoms: N40.0

## 2013-08-24 HISTORY — DX: Type 2 diabetes mellitus without complications: E11.9

## 2013-08-24 MED ORDER — GLUCOSE BLOOD VI STRP: ORAL_STRIP | Status: DC

## 2013-08-24 MED ORDER — LISINOPRIL 5 MG PO TABS: 5.0000 mg | ORAL_TABLET | Freq: Every day | ORAL | Status: DC

## 2013-08-24 MED ORDER — ATENOLOL 100 MG PO TABS: ORAL_TABLET | ORAL | Status: DC

## 2013-08-24 MED ORDER — ZOSTER VACCINE LIVE 19400 UNT/0.65ML ~~LOC~~ SOLR: 0.6500 mL | Freq: Once | SUBCUTANEOUS | Status: DC

## 2013-08-24 MED ORDER — ALLOPURINOL 100 MG PO TABS: 100.0000 mg | ORAL_TABLET | Freq: Every day | ORAL | Status: DC

## 2013-08-24 MED ORDER — LINACLOTIDE 145 MCG PO CAPS: 145.0000 ug | ORAL_CAPSULE | Freq: Every day | ORAL | Status: DC

## 2013-08-24 MED ORDER — FREESTYLE LANCETS MISC: Status: DC

## 2013-08-24 MED ORDER — SIMVASTATIN 40 MG PO TABS: ORAL_TABLET | ORAL | Status: DC

## 2013-08-24 MED ORDER — TAMSULOSIN HCL 0.4 MG PO CAPS: ORAL_CAPSULE | ORAL | Status: DC

## 2013-08-24 NOTE — Progress Notes (Signed)
Patient ID: Stanley Sullivan, male   DOB: May 28, 1940, 73 y.o.   MRN: [id number]   [facility]  [facility]  Code Status: need to discuss at next routine visit  No Known Allergies  Chief Complaint  Patient presents with  . Medical Management of Chronic Issues    medication management f/u with no recent labs.  Marland Kitchen. other    LT knee pain x 4 months  . Immunizations    print shingles vaccine    HPI: Patient is a 73 y.o. [place] male seen in the office today for medical mgt of chronic illnesses.    Got cortisone shot and did not help him.  Not walking well due to pain.  [facility], [facility].    Still getting the shortness of breath.  Tests were normal with Stanley Sullivan.  Stanley Sullivan is concerned about pulmonary fibrosis so he recommended he see Stanley Sullivan for PFTs.    Allergies have been ok right now.    Has been taking his pills for his BPH for many years.  Problem has been stable.  Is passing urine ok when takes his medication, but does not take regularly though we have discussed that it is meant to be a daily regimen.  Review of Systems:  Review of Systems  Constitutional: Negative for fever, chills and weight loss.  HENT: Positive for congestion.   Eyes: Negative for blurred vision.  Respiratory: Positive for shortness of breath. Negative for cough.   Cardiovascular: Negative for chest pain, palpitations and leg swelling.  Gastrointestinal: Positive for constipation. Negative for abdominal pain.  Genitourinary: Negative for dysuria, urgency and frequency.       Difficulty with stream if misses flomax  Musculoskeletal: Positive for joint pain. Negative for falls and myalgias.  Skin: Negative for rash.  Neurological: Negative for dizziness, loss of consciousness, weakness and headaches.  Endo/Heme/Allergies: Does not bruise/bleed easily.  Psychiatric/Behavioral: Negative for depression and memory loss.     Past Medical  History  Diagnosis Date  . Osteoarthrosis, unspecified whether generalized or localized, unspecified site   . Pain in joint, lower leg   . Chronic kidney disease, stage III (moderate)   . Other specified visual disturbances   . Unspecified disorder of kidney and ureter   . Other abnormal glucose   . Obesity, unspecified   . Urticaria, unspecified   . Allergy, unspecified not elsewhere classified   . Pain in limb   . Gout, unspecified   . Other and unspecified hyperlipidemia   . External hemorrhoids without mention of complication   . Organic sleep apnea, unspecified   . Unspecified essential hypertension   . Hypertrophy of prostate without urinary obstruction and other lower urinary tract symptoms (LUTS)   . Other atopic dermatitis and related conditions     History reviewed. No pertinent past surgical history.  Social History:   reports that he has never smoked. He does not have any smokeless tobacco history on file. His alcohol and drug histories are not on file.  History reviewed. No pertinent family history.  Medications: Patient's Medications  New Prescriptions   No medications on file  Previous Medications   ACETAMINOPHEN (TYLENOL) 500 MG TABLET    Take 500 mg by mouth every 6 (six) hours as needed for pain. Take 1-2 tablets by mouth every 6-8 hours as needed for pain.   ALLOPURINOL (ZYLOPRIM) 100 MG TABLET    Take 1 tablet (100 mg total)  by mouth daily.   ATENOLOL (TENORMIN) 100 MG TABLET    TAKE 1 TABLET BY MOUTH ONCE DAILY FOR BLOOD PRESSURE   CLOBETASOL OINTMENT (TEMOVATE) 0.05 %    Apply 1 application topically 2 (two) times daily. Apply twice a day on right leg rash.   DEXTROMETHORPHAN-GUAIFENESIN (MUCINEX DM MAXIMUM STRENGTH) 60-1200 MG TB12    Take 1 tablet by mouth every 12 (twelve) hours. Take 1 tablet every 12 hours.   FLUTICASONE (FLONASE) 50 MCG/ACT NASAL SPRAY    Place 1 spray into the nose 2 (two) times daily. One spray in each nostril twice daily.    GLUCOSE BLOOD TEST STRIP    Test glucose daily   LANCETS (FREESTYLE) LANCETS    1 each by Other route as needed for other. Use as instructed   LINACLOTIDE (LINZESS) 145 MCG CAPS CAPSULE    Take 1 capsule (145 mcg total) by mouth daily.   MOMETASONE (ELOCON) 0.1 % CREAM    Apply 1 application topically daily.   SIMVASTATIN (ZOCOR) 40 MG TABLET    TAKE 1 TABLET BY MOUTH DAILY FOR CHOLESTEROL   TAMSULOSIN (FLOMAX) 0.4 MG CAPS    TAKE 1 CAPSULE BY MOUTH ONCE A DAY   ZOSTER VACCINE LIVE, PF, (ZOSTAVAX) [id number] UNT/0.65ML INJECTION    Inject 0.65 mLs into the skin once.  Modified Medications   No medications on file  Discontinued Medications   No medications on file     Physical Exam: Filed Vitals:   08/24/13 1014  BP: 150/82  Pulse: 60  Temp: 97.7 F (36.5 C)  TempSrc: Oral  Weight: 176 lb 12.8 oz (80.196 kg)  SpO2: 97%  Physical Exam  Constitutional: He is oriented to person, place, and time. He appears well-developed and well-nourished.  Cardiovascular: Normal rate, regular rhythm, normal heart sounds and intact distal pulses.   Pulmonary/Chest: Effort normal.  Coarse breath sounds throughout  Abdominal: Soft. Bowel sounds are normal. He exhibits no distension and no mass. There is no tenderness.  Musculoskeletal: Normal range of motion. He exhibits tenderness.  Of left knee, small effusion, increased warmth left knee vs. right  Neurological: He is alert and oriented to person, place, and time. He exhibits normal muscle tone.  Skin: Skin is warm and dry.  Psychiatric: He has a normal mood and affect.     Labs reviewed: Basic Metabolic Panel:  Recent Labs  60/45/4008/13/14 0858  NA 140  K 5.0  CL 104  CO2 23  GLUCOSE 94  BUN 21  CREATININE 1.74*  CALCIUM 9.0   Liver Function Tests:  Recent Labs  12/10/12 0858  AST 14  ALT 6  ALKPHOS 58  BILITOT 0.4  PROT 6.2   CBC:  Recent Labs  12/10/12 0858  WBC 8.4  NEUTROABS 5.1  HGB 13.9  HCT 43.5  MCV 73*   Lipid  Panel:  Recent Labs  12/10/12 0858  HDL 44  LDLCALC 109*  TRIG 61  CHOLHDL 3.8    Assessment/Plan 1. Type II or unspecified type diabetes mellitus without mention of complication, not stated as uncontrolled - need to f/u labs as these have not been done since august 2014 (does not usually come for regular visits only when needed--today was first regular visit) - simvastatin (ZOCOR) 40 MG tablet; TAKE 1 TABLET BY MOUTH DAILY FOR CHOLESTEROL  Dispense: 30 tablet; Refill: 5 - glucose blood test strip; Test glucose daily  Dispense: 100 each; Refill: 3 - Lancets (FREESTYLE) lancets; Use as instructed  Dispense: 100 each; Refill: 1 - Hemoglobin A1c - Lipid panel - CBC With differential/Platelet - Comprehensive metabolic panel - lisinopril (PRINIVIL,ZESTRIL) 5 MG tablet; Take 1 tablet (5 mg total) by mouth daily. For blood pressure and diabetes  Dispense: 30 tablet; Refill: 3  2. Dyspnea on exertion - has seen Dr. Jacinto Halim and had normal echo and stress test, but xray with concerns for pulmonary fibrosis -refer to Dr. Arrie Aran missed original appt made by Dr. Verl Dicker office due to trip to [place] - Ambulatory referral to Pulmonology for eval and PFTs  3. Osteoarthritis of left knee -requests to see ortho now--has seen [facility] ortho rheumatologist, Dr. Link Snuffer before - AMB referral to orthopedics due to unresponsiveness to conservative treatments here in the office including cortisone injections at this point--may need cartilage injections at this point -is limiting his ability to exercise and lose weight  4. Chronic idiopathic constipation -persists, eats high fiber diet and still needs linzess - Linaclotide (LINZESS) 145 MCG CAPS capsule; Take 1 capsule (145 mcg total) by mouth daily.  Dispense: 30 capsule; Refill: 3  5. Benign prostatic hypertrophy -conts with flomax, but uses prn despite recommendations for daily use -sees [facility]--can return there if he desires  surgery - tamsulosin (FLOMAX) 0.4 MG CAPS capsule; TAKE 1 CAPSULE BY MOUTH ONCE A DAY  Dispense: 30 capsule; Refill: 6  6. Essential hypertension, benign -bp remains elevated today and his daughter notes that it is as high as 180 systolic at home  -cont tenormin, but add low dose lisinopril if ok with Dr. Zetta Bills who he sees this afternoon--will send my note and left note on pt's AVS and advised daughter to ask if he is ok with this - atenolol (TENORMIN) 100 MG tablet; TAKE 1 TABLET BY MOUTH ONCE DAILY FOR BLOOD PRESSURE  Dispense: 30 tablet; Refill: 5 - lisinopril (PRINIVIL,ZESTRIL) 5 MG tablet; Take 1 tablet (5 mg total) by mouth daily. For blood pressure and diabetes  Dispense: 30 tablet; Refill: 3  7. Gout -no recent flares, cont daily allopurinol - allopurinol (ZYLOPRIM) 100 MG tablet; Take 1 tablet (100 mg total) by mouth daily.  Dispense: 30 tablet; Refill: 5  8. Need for prophylactic vaccination and inoculation against other viral diseases(V04.89) -script provided today to get this at [facility] - zoster vaccine live, PF, (ZOSTAVAX) 16109 UNT/0.65ML injection; Inject 19,400 Units into the skin once.  Dispense: 1 each; Refill: 0  Labs/tests ordered:   Orders Placed This Encounter  Procedures  . Hemoglobin A1c  . Lipid panel    Order Specific Question:  Has the patient fasted?    Answer:  Yes  . CBC With differential/Platelet  . Comprehensive metabolic panel    Order Specific Question:  Has the patient fasted?    Answer:  Yes  . AMB referral to orthopedics    Referral Priority:  Routine    Referral Type:  Consultation    Number of Visits Requested:  1  . Ambulatory referral to Pulmonology    Referral Priority:  Routine    Referral Type:  Consultation    Referral Reason:  Specialty Services Required    Requested Specialty:  Pulmonary Disease    Number of Visits Requested:  1    Next appt:  3 mos to f/u on DMII, HTN, knee

## 2013-08-24 NOTE — Patient Instructions (Signed)
Please check with Dr. Allena KatzPatel about the addition of lisinopril 5mg  to his routine for his blood pressure in the context of diabetes.

## 2013-08-25 ENCOUNTER — Telehealth: Payer: Self-pay | Admitting: *Deleted

## 2013-08-25 LAB — COMPREHENSIVE METABOLIC PANEL
ALT: 6 IU/L (ref 0–44)
AST: 15 IU/L (ref 0–40)
Albumin/Globulin Ratio: 1.6 (ref 1.1–2.5)
Albumin: 3.6 g/dL (ref 3.5–4.8)
Alkaline Phosphatase: 49 IU/L (ref 39–117)
BUN/Creatinine Ratio: 12 (ref 10–22)
BUN: 17 mg/dL (ref 8–27)
CO2: 23 mmol/L (ref 18–29)
Calcium: 8.7 mg/dL (ref 8.6–10.2)
Chloride: 107 mmol/L (ref 97–108)
Creatinine, Ser: 1.43 mg/dL — ABNORMAL HIGH (ref 0.76–1.27)
GFR calc Af Amer: 55 mL/min/{1.73_m2} — ABNORMAL LOW (ref >59)
GFR calc non Af Amer: 48 mL/min/{1.73_m2} — ABNORMAL LOW (ref >59)
Globulin, Total: 2.3 g/dL (ref 1.5–4.5)
Glucose: 97 mg/dL (ref 65–99)
Potassium: 5.4 mmol/L — ABNORMAL HIGH (ref 3.5–5.2)
Sodium: 143 mmol/L (ref 134–144)
Total Bilirubin: 0.8 mg/dL (ref 0.0–1.2)
Total Protein: 5.9 g/dL — ABNORMAL LOW (ref 6.0–8.5)

## 2013-08-25 LAB — LIPID PANEL
Chol/HDL Ratio: 4.4 ratio units (ref 0.0–5.0)
Cholesterol, Total: 201 mg/dL — ABNORMAL HIGH (ref 100–199)
HDL: 46 mg/dL (ref >39)
LDL Calculated: 137 mg/dL — ABNORMAL HIGH (ref 0–99)
Triglycerides: 92 mg/dL (ref 0–149)
VLDL Cholesterol Cal: 18 mg/dL (ref 5–40)

## 2013-08-25 LAB — CBC WITH DIFFERENTIAL
Basophils Absolute: 0 10*3/uL (ref 0.0–0.2)
Basos: 0 %
Eos: 4 %
Eosinophils Absolute: 0.3 10*3/uL (ref 0.0–0.4)
HCT: 43.3 % (ref 37.5–51.0)
Hemoglobin: 14.3 g/dL (ref 12.6–17.7)
Immature Grans (Abs): 0.1 10*3/uL (ref 0.0–0.1)
Immature Granulocytes: 1 %
Lymphocytes Absolute: 1.4 10*3/uL (ref 0.7–3.1)
Lymphs: 18 %
MCH: 24 pg — ABNORMAL LOW (ref 26.6–33.0)
MCHC: 33 g/dL (ref 31.5–35.7)
MCV: 73 fL — ABNORMAL LOW (ref 79–97)
Monocytes Absolute: 0.9 10*3/uL (ref 0.1–0.9)
Monocytes: 12 %
Neutrophils Absolute: 4.9 10*3/uL (ref 1.4–7.0)
Neutrophils Relative %: 65 %
Platelets: 228 10*3/uL (ref 150–379)
RBC: 5.97 x10E6/uL — ABNORMAL HIGH (ref 4.14–5.80)
RDW: 17.3 % — ABNORMAL HIGH (ref 12.3–15.4)
WBC: 7.7 10*3/uL (ref 3.4–10.8)

## 2013-08-25 LAB — HEMOGLOBIN A1C
Est. average glucose Bld gHb Est-mCnc: 151 mg/dL
Hgb A1c MFr Bld: 6.9 % — ABNORMAL HIGH (ref 4.8–5.6)

## 2013-08-25 NOTE — Telephone Encounter (Signed)
Arna Mediciora with [facility] called and requested labs that was drawn yesterday so they don't have to duplicate. Faxed labs to her for patient's appointment. Fax: [phone]

## 2013-09-03 ENCOUNTER — Other Ambulatory Visit: Payer: Self-pay | Admitting: *Deleted

## 2013-09-03 ENCOUNTER — Encounter: Payer: Self-pay | Admitting: *Deleted

## 2013-09-03 MED ORDER — LINAGLIPTIN 5 MG PO TABS: 5.0000 mg | ORAL_TABLET | Freq: Every day | ORAL | Status: DC

## 2013-09-03 NOTE — Telephone Encounter (Signed)
Pt notified VIA phone and rx sent to the pharmacy. 

## 2013-09-28 ENCOUNTER — Emergency Department (HOSPITAL_COMMUNITY)
Admission: EM | Admit: 2013-09-28 | Discharge: 2013-09-28 | Disposition: A | Discharge status: Home / Self Care | Payer: Medicare Other | Source: Home / Self Care | Attending: Emergency Medicine | Admitting: Emergency Medicine

## 2013-09-28 ENCOUNTER — Encounter (HOSPITAL_COMMUNITY): Payer: Self-pay | Admitting: Emergency Medicine

## 2013-09-28 DIAGNOSIS — M549 Dorsalgia, unspecified: Secondary | ICD-10-CM

## 2013-09-28 LAB — POCT URINALYSIS DIP (DEVICE)
Bilirubin Urine: NEGATIVE
Glucose, UA: NEGATIVE mg/dL
HGB URINE DIPSTICK: NEGATIVE
KETONES UR: NEGATIVE mg/dL
Leukocytes, UA: NEGATIVE
Nitrite: NEGATIVE
PH: 6 (ref 5.0–8.0)
PROTEIN: NEGATIVE mg/dL
SPECIFIC GRAVITY, URINE: 1.015 (ref 1.005–1.030)
UROBILINOGEN UA: 0.2 mg/dL (ref 0.0–1.0)

## 2013-09-28 MED ORDER — MELOXICAM 7.5 MG PO TABS: 7.5000 mg | ORAL_TABLET | Freq: Every day | ORAL | Status: DC

## 2013-09-28 NOTE — ED Notes (Addendum)
Pt c/o lower back pain onset 4-5 days Reports pain increases w/activity and when having bowel movements Hx of arthritis and Chronic Kidney disease Denies inj/trauma, urinary problems Alert w/no signs of acute distress; ambulated well to exam room w/NAD

## 2013-09-28 NOTE — ED Provider Notes (Signed)
CSN: [id number]     Arrival date & time 09/28/13  0944 History   First MD Initiated Contact with Patient 09/28/13 1040     Chief Complaint  Patient presents with  . Back Pain   (Consider location/radiation/quality/duration/timing/severity/associated sxs/prior Treatment) HPI Comments: 73 year old male presents for evaluation of back pain. He has had back pain in his lower back for about 4 days. The pain is constant. It is midline and slightly out to the right. It is increased with walking, also he notices a slightly increased pain with having a bowel movement or urinating. He also suffers from chronic constipation as well as BPH. he denies any worsening of these conditions. Denies testicle pain, abdominal pain. Denies any injury. He has never had this before that he can recall. No loss of bowel or bladder control. The pain does not radiate into the legs. He has no numbness or weakness  Patient is a 73 y.o. male presenting with back pain.  Back Pain   Past Medical History  Diagnosis Date  . Osteoarthrosis, unspecified whether generalized or localized, unspecified site   . Pain in joint, lower leg   . Chronic kidney disease, stage III (moderate)   . Other specified visual disturbances   . Unspecified disorder of kidney and ureter   . Other abnormal glucose   . Obesity, unspecified   . Urticaria, unspecified   . Allergy, unspecified not elsewhere classified   . Pain in limb   . Gout, unspecified   . Other and unspecified hyperlipidemia   . External hemorrhoids without mention of complication   . Organic sleep apnea, unspecified   . Unspecified essential hypertension   . Hypertrophy of prostate without urinary obstruction and other lower urinary tract symptoms (LUTS)   . Other atopic dermatitis and related conditions    History reviewed. No pertinent past surgical history. No family history on file. History  Substance Use Topics  . Smoking status: Never Smoker   . Smokeless tobacco:  Not on file  . Alcohol Use: Not on file    Review of Systems  Gastrointestinal: Positive for constipation (Chronic).  Genitourinary:       BPH  Musculoskeletal: Positive for back pain.  All other systems reviewed and are negative.   Allergies  Review of patient's allergies indicates no known allergies.  Home Medications   Prior to Admission medications   Medication Sig Start Date End Date Taking? Authorizing Provider  atenolol (TENORMIN) 100 MG tablet TAKE 1 TABLET BY MOUTH ONCE DAILY FOR BLOOD PRESSURE 08/24/13  Yes Tiffany L Reed, DO  lisinopril (PRINIVIL,ZESTRIL) 5 MG tablet Take 1 tablet (5 mg total) by mouth daily. For blood pressure and diabetes 08/24/13  Yes Tiffany L Reed, DO  tamsulosin (FLOMAX) 0.4 MG CAPS capsule TAKE 1 CAPSULE BY MOUTH ONCE A DAY 08/24/13  Yes Tiffany L Reed, DO  acetaminophen (TYLENOL) 500 MG tablet Take 500 mg by mouth every 6 (six) hours as needed for pain. Take 1-2 tablets by mouth every 6-8 hours as needed for pain.    Historical Provider, MD  allopurinol (ZYLOPRIM) 100 MG tablet Take 1 tablet (100 mg total) by mouth daily. 08/24/13   Tiffany L Reed, DO  clobetasol ointment (TEMOVATE) 0.05 % Apply 1 application topically 2 (two) times daily. Apply twice a day on right leg rash.    Historical Provider, MD  Dextromethorphan-Guaifenesin (MUCINEX DM MAXIMUM STRENGTH) 60-1200 MG TB12 Take 1 tablet by mouth every 12 (twelve) hours. Take 1 tablet every 12  hours.    Historical Provider, MD  fluticasone (FLONASE) 50 MCG/ACT nasal spray Place 1 spray into the nose 2 (two) times daily. One spray in each nostril twice daily.    Historical Provider, MD  glucose blood test strip Test glucose daily 08/24/13   Tiffany L Reed, DO  Lancets (FREESTYLE) lancets Use as instructed 08/24/13   Tiffany L Reed, DO  Linaclotide (LINZESS) 145 MCG CAPS capsule Take 1 capsule (145 mcg total) by mouth daily. 08/24/13   Tiffany L Reed, DO  linagliptin (TRADJENTA) 5 MG TABS tablet Take 1  tablet (5 mg total) by mouth daily. For diabetes 09/03/13   Tiffany L Reed, DO  meloxicam (MOBIC) 7.5 MG tablet Take 1 tablet (7.5 mg total) by mouth daily. 09/28/13   Adrian BlackwaterZachary H Jazminn Pomales, PA-C  mometasone (ELOCON) 0.1 % cream Apply 1 application topically daily.    Historical Provider, MD  simvastatin (ZOCOR) 40 MG tablet TAKE 1 TABLET BY MOUTH DAILY FOR CHOLESTEROL 08/24/13   Tiffany L Reed, DO  zoster vaccine live, PF, (ZOSTAVAX) [id number] UNT/0.65ML injection Inject 19,400 Units into the skin once. 08/24/13   Tiffany L Reed, DO   BP 172/82  Pulse 52  Temp(Src) 97.6 F (36.4 C) (Oral)  Resp 16  SpO2 93% Physical Exam  Nursing note and vitals reviewed. Constitutional: He is oriented to person, place, and time. He appears well-developed and well-nourished. No distress.  HENT:  Head: Normocephalic and atraumatic.  Cardiovascular: Normal rate, regular rhythm and normal heart sounds.   Pulmonary/Chest: Effort normal and breath sounds normal. No respiratory distress.  Abdominal: Soft. Bowel sounds are normal. He exhibits no distension and no mass. There is no tenderness. There is no rebound and no guarding.  Genitourinary: Rectum normal. Prostate is enlarged (mild, symmetric enlargement).  Neurological: He is alert and oriented to person, place, and time. Coordination normal.  Skin: Skin is warm and dry. No rash noted. He is not diaphoretic.  Psychiatric: He has a normal mood and affect. Judgment normal.    ED Course  Procedures (including critical care time) Labs Review Labs Reviewed  POCT URINALYSIS DIP (DEVICE)    Imaging Review No results found.   MDM   1. Back pain    Simple back pain, it is likely exacerbated by urinating or having a bowel movement because he always has to strain when doing this because of BPH and chronic constipation. We'll treat with daily meloxicam. Followup with primary care.  O2 rechecked and was 95% on RA    Meds ordered this encounter  Medications  .  meloxicam (MOBIC) 7.5 MG tablet    Sig: Take 1 tablet (7.5 mg total) by mouth daily.    Dispense:  30 tablet    Refill:  0    Order Specific Question:  Supervising Provider    Answer:  Lorenz CoasterKELLER, DAVID C [[id number]]     Graylon GoodZachary H Trameka Dorough, PA-C 09/28/13 1124

## 2013-09-28 NOTE — Discharge Instructions (Signed)
Back Pain, Adult Low back pain is very common. About 1 in 5 people have back pain.The cause of low back pain is rarely dangerous. The pain often gets better over time.About half of people with a sudden onset of back pain feel better in just 2 weeks. About 8 in 10 people feel better by 6 weeks.  CAUSES Some common causes of back pain include:  Strain of the muscles or ligaments supporting the spine.  Wear and tear (degeneration) of the spinal discs.  Arthritis.  Direct injury to the back. DIAGNOSIS Most of the time, the direct cause of low back pain is not known.However, back pain can be treated effectively even when the exact cause of the pain is unknown.Answering your caregiver's questions about your overall health and symptoms is one of the most accurate ways to make sure the cause of your pain is not dangerous. If your caregiver needs more information, he or she may order lab work or imaging tests (X-rays or MRIs).However, even if imaging tests show changes in your back, this usually does not require surgery. HOME CARE INSTRUCTIONS For many people, back pain returns.Since low back pain is rarely dangerous, it is often a condition that people can learn to manageon their own.   Remain active. It is stressful on the back to sit or stand in one place. Do not sit, drive, or stand in one place for more than 30 minutes at a time. Take short walks on level surfaces as soon as pain allows.Try to increase the length of time you walk each day.  Do not stay in bed.Resting more than 1 or 2 days can delay your recovery.  Do not avoid exercise or work.Your body is made to move.It is not dangerous to be active, even though your back may hurt.Your back will likely heal faster if you return to being active before your pain is gone.  Pay attention to your body when you bend and lift. Many people have less discomfortwhen lifting if they bend their knees, keep the load close to their bodies,and  avoid twisting. Often, the most comfortable positions are those that put less stress on your recovering back.  Find a comfortable position to sleep. Use a firm mattress and lie on your side with your knees slightly bent. If you lie on your back, put a pillow under your knees.  Only take over-the-counter or prescription medicines as directed by your caregiver. Over-the-counter medicines to reduce pain and inflammation are often the most helpful.Your caregiver may prescribe muscle relaxant drugs.These medicines help dull your pain so you can more quickly return to your normal activities and healthy exercise.  Put ice on the injured area.  Put ice in a plastic bag.  Place a towel between your skin and the bag.  Leave the ice on for 15-20 minutes, 03-04 times a day for the first 2 to 3 days. After that, ice and heat may be alternated to reduce pain and spasms.  Ask your caregiver about trying back exercises and gentle massage. This may be of some benefit.  Avoid feeling anxious or stressed.Stress increases muscle tension and can worsen back pain.It is important to recognize when you are anxious or stressed and learn ways to manage it.Exercise is a great option. SEEK MEDICAL CARE IF:  You have pain that is not relieved with rest or medicine.  You have pain that does not improve in 1 week.  You have new symptoms.  You are generally not feeling well. SEEK   IMMEDIATE MEDICAL CARE IF:   You have pain that radiates from your back into your legs.  You develop new bowel or bladder control problems.  You have unusual weakness or numbness in your arms or legs.  You develop nausea or vomiting.  You develop abdominal pain.  You feel faint. Document Released: 04/16/2005 Document Revised: 10/16/2011 Document Reviewed: 09/04/2010 Exit[employer] Patient Information 2014 [employer].  

## 2013-09-29 NOTE — ED Provider Notes (Signed)
Medical screening examination/treatment/procedure(s) were performed by non-physician practitioner and as supervising physician I was immediately available for consultation/collaboration.  Leslee Home, M.D.  Reuben Likes, MD 09/29/13 2220

## 2013-09-30 ENCOUNTER — Telehealth: Payer: Self-pay

## 2013-09-30 NOTE — Telephone Encounter (Signed)
Stanley Sullivan went to the ED/urgent care with low back pain. I had just stopped his nsaids due to his kidney function. They put him back on mobic. Please notify his daughter that he should not take this for more than a week and must hydrate very well. ----- Message ----- From: SYSTEM Sent: 09/28/2013 11:36 AM To: Kermit Balo, DO   Left message on voicemail for patient to return call when available

## 2013-10-01 NOTE — Telephone Encounter (Signed)
Left message on voicemail for patient to return call when available   

## 2013-10-02 NOTE — Telephone Encounter (Signed)
Spoke with Stanley Sullivan, patient only took mobic x 3 days and stopped. Stanley Sullivan read the information on mobic and realized that is was not good for him to take over a long period of time due to kidney function.

## 2013-10-06 ENCOUNTER — Other Ambulatory Visit: Payer: Self-pay | Admitting: *Deleted

## 2013-10-07 ENCOUNTER — Encounter (INDEPENDENT_AMBULATORY_CARE_PROVIDER_SITE_OTHER): Payer: Self-pay

## 2013-10-07 ENCOUNTER — Other Ambulatory Visit (INDEPENDENT_AMBULATORY_CARE_PROVIDER_SITE_OTHER): Payer: Medicare Other

## 2013-10-07 ENCOUNTER — Encounter: Payer: Self-pay | Admitting: Internal Medicine

## 2013-10-07 ENCOUNTER — Ambulatory Visit (INDEPENDENT_AMBULATORY_CARE_PROVIDER_SITE_OTHER): Payer: Medicare Other | Admitting: Internal Medicine

## 2013-10-07 ENCOUNTER — Institutional Professional Consult (permissible substitution): Payer: Medicare Other | Admitting: Internal Medicine

## 2013-10-07 VITALS — BP 130/78 | HR 60 | Ht 65.5 in | Wt 177.2 lb

## 2013-10-07 DIAGNOSIS — J8410 Pulmonary fibrosis, unspecified: Secondary | ICD-10-CM

## 2013-10-07 DIAGNOSIS — R0600 Dyspnea, unspecified: Secondary | ICD-10-CM

## 2013-10-07 DIAGNOSIS — J849 Interstitial pulmonary disease, unspecified: Secondary | ICD-10-CM

## 2013-10-07 DIAGNOSIS — R05 Cough: Secondary | ICD-10-CM

## 2013-10-07 DIAGNOSIS — R0989 Other specified symptoms and signs involving the circulatory and respiratory systems: Secondary | ICD-10-CM

## 2013-10-07 DIAGNOSIS — R0609 Other forms of dyspnea: Secondary | ICD-10-CM

## 2013-10-07 DIAGNOSIS — G4733 Obstructive sleep apnea (adult) (pediatric): Secondary | ICD-10-CM

## 2013-10-07 DIAGNOSIS — R059 Cough, unspecified: Secondary | ICD-10-CM

## 2013-10-07 LAB — SEDIMENTATION RATE: Sed Rate: 13 mm/hr (ref 0–22)

## 2013-10-07 NOTE — Patient Instructions (Addendum)
#  Shortness of breath and cough  - I am concerned you might have Interstitial Lung Disease (ILD) atleast of moderate severity based on exertional desaturation  -  There are > 100 varieties of this -  Set up 2L o2 with exertion - To narrow down possibilities and assess severity please do the following tests  - do full PFT breathing test (choose a location depending on schedule and convenience)  -  do High Resolution CT chest wo contrast (only Dr Rosario Jacks or Dr Weber Cooks to read)  - do autoimmune panel: Serum: ESR, ANA, DS-DNA, RF, anti-CCP, ssA, ssB, scl-70, ANCA, MPO and PR-3 antibodies, Total CK,  Aldolase,  Hypersensitivity Pneumonitis Panel  #Cough   - likely due to high pre test prob of ILD  - ACE inhibitor could be making it worse - will address ACE inhibitor issue at followup   #Rt knee issue  - see Dr Hollace Kinnier, DO   #FOllowup  - Before July 15th, 2015

## 2013-10-07 NOTE — Progress Notes (Signed)
Subjective:    Patient ID: Stanley Sullivan, male    DOB: [birth year], 73 y.o.   MRN: [id number] PCP REED, TIFFANY, DO  HPI  IOV 10/07/2013  Chief Complaint  Patient presents with  . Pulmonary Consult    Referred by Dr. Mariea Clonts for dyspnea on exertion. Denies CP. C/o mild productive cough.    73 year old [place] to the [place]. Living in [place]  since the 1970s and 1980s. 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 . In the 22s and 1970s lived in [place] and worked as a Education officer, environmental and also Surveyor, minerals. He is accompanied by his son who gives a better history due to language issues   for the last few years has had insidious onset of dyspnea associated with cough. With symptoms of progressive especially in the last 6 months particularly dyspnea. This visit it is severe. Relieved by rest. Business noticeable class II to class III levels of activity such as climbing a flight of stairs or walking from the car to the office. There is no associated chest pain. The cough is dry and cousin both a day and night. Cough is associated with ACE inhibitor intake. Symptoms are both associated with acid reflux disease. Is no history of associated collagen vascular disease but he does have gout and he has a right knee brace. There is no obvious postnasal drainage. Son is aware that that might be c concern of interstitial lung disease    Most recently his right infrapatellar area is tender and this has not been reported primary care   Dyspne and ILD Relevant history -  reports that he quit smoking about 20 years ago. His smoking use included Cigarettes. He has a 7.5 pack-year smoking history. He has quit using smokeless tobacco. His smokeless tobacco use included Chew. 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 Denies mold exposure - Chest x-ray December 2014:  Radiologist's concern about the possibility of interstitial lung disease and have suggested CT scan - Creatinine 1.5 mg percent with a GFR of 40 08/05/2013 - No evidence of anemia with a hemoglobin of 14.3 g percent apical doesn't 15 - Nuclear medicine cardiac stress test January 2015 at [facility] cardiovascular: EKG with poor R-wave progression but stress test showed ejection fraction of 57% but no evidence of ischemia or scar at rest and with exertion   Walking desat test 185 feet x 3 laps on RA: Walked very slow and half lap - desat to 84% on RA     Past Medical History  Diagnosis Date  . Osteoarthrosis, unspecified whether generalized or localized, unspecified site   . Pain in joint, lower leg   . Chronic kidney disease, stage III (moderate)   . Other specified visual disturbances   . Unspecified disorder of kidney and ureter   . Other abnormal glucose   . Obesity, unspecified   . Urticaria, unspecified   . Allergy, unspecified not elsewhere classified   . Pain in limb   . Gout, unspecified   . Other and unspecified hyperlipidemia   . External hemorrhoids without mention of complication   . Organic sleep apnea, unspecified   . Unspecified essential hypertension   . Hypertrophy of prostate without urinary obstruction and other lower urinary tract symptoms (LUTS)   . Other atopic dermatitis and related conditions  No family history on file.   History   Social History  . Marital Status: Married    Spouse Name: N/A    Number of Children: N/A  . Years of Education: N/A   Occupational History  . retired    Social History Main Topics  . Smoking status: Former Smoker -- 0.50 packs/day for 15 years    Types: Cigarettes    Quit date: 04/30/1993  . Smokeless tobacco: Former Systems developer    Types: Chew  . Alcohol Use: Yes     Comment: occassional  . Drug Use: No  . Sexual Activity: Not on file   Other Topics Concern  . Not on file   Social History Narrative  . No  narrative on file     No Known Allergies   Outpatient Prescriptions Prior to Visit  Medication Sig Dispense Refill  . acetaminophen (TYLENOL) 500 MG tablet Take 500 mg by mouth every 6 (six) hours as needed for pain. Take 1-2 tablets by mouth every 6-8 hours as needed for pain.      Marland Kitchen allopurinol (ZYLOPRIM) 100 MG tablet Take 1 tablet (100 mg total) by mouth daily.  30 tablet  5  . atenolol (TENORMIN) 100 MG tablet TAKE 1 TABLET BY MOUTH ONCE DAILY FOR BLOOD PRESSURE  30 tablet  5  . Dextromethorphan-Guaifenesin (MUCINEX DM MAXIMUM STRENGTH) 60-1200 MG TB12 Take 1 tablet by mouth every 12 (twelve) hours. Take 1 tablet every 12 hours.      Marland Kitchen glucose blood test strip Test glucose daily  100 each  3  . Lancets (FREESTYLE) lancets Use as instructed  100 each  1  . simvastatin (ZOCOR) 40 MG tablet TAKE 1 TABLET BY MOUTH DAILY FOR CHOLESTEROL  30 tablet  5  . tamsulosin (FLOMAX) 0.4 MG CAPS capsule TAKE 1 CAPSULE BY MOUTH ONCE A DAY  30 capsule  6  . clobetasol ointment (TEMOVATE) 6.23 % Apply 1 application topically 2 (two) times daily. Apply twice a day on right leg rash.      . fluticasone (FLONASE) 50 MCG/ACT nasal spray Place 1 spray into the nose 2 (two) times daily. One spray in each nostril twice daily.      . Linaclotide (LINZESS) 145 MCG CAPS capsule Take 1 capsule (145 mcg total) by mouth daily.  30 capsule  3  . linagliptin (TRADJENTA) 5 MG TABS tablet Take 1 tablet (5 mg total) by mouth daily. For diabetes  30 tablet  3  . lisinopril (PRINIVIL,ZESTRIL) 5 MG tablet Take 1 tablet (5 mg total) by mouth daily. For blood pressure and diabetes  30 tablet  3  . meloxicam (MOBIC) 7.5 MG tablet Take 1 tablet (7.5 mg total) by mouth daily.  30 tablet  0  . mometasone (ELOCON) 0.1 % cream Apply 1 application topically daily.      Marland Kitchen zoster vaccine live, PF, (ZOSTAVAX) 76283 UNT/0.65ML injection Inject 19,400 Units into the skin once.  1 each  0   No facility-administered medications prior to  visit.       Review of Systems  Constitutional: Negative for fever and unexpected weight change.  HENT: Negative for congestion, dental problem, ear pain, nosebleeds, postnasal drip, rhinorrhea, sinus pressure, sneezing, sore throat and trouble swallowing.   Eyes: Negative for redness and itching.  Respiratory: Positive for cough and shortness of breath. Negative for chest tightness and wheezing.   Cardiovascular: Positive for leg swelling. Negative for palpitations.  Gastrointestinal: Negative for nausea and vomiting.  Genitourinary: Negative for dysuria.  Musculoskeletal: Negative for joint swelling.  Skin: Negative for rash.  Neurological: Negative for headaches.  Hematological: Does not bruise/bleed easily.  Psychiatric/Behavioral: Negative for dysphoric mood. The patient is not nervous/anxious.        Objective:   Physical Exam  Nursing note and vitals reviewed. Constitutional: He is oriented to person, place, and time. He appears well-developed and well-nourished. No distress.  Son doing translation  HENT:  Head: Normocephalic and atraumatic.  Right Ear: External ear normal.  Left Ear: External ear normal.  Mouth/Throat: Oropharynx is clear and moist. No oropharyngeal exudate.  Eyes: Conjunctivae and EOM are normal. Pupils are equal, round, and reactive to light. Right eye exhibits no discharge. Left eye exhibits no discharge. No scleral icterus.  Neck: Normal range of motion. Neck supple. No JVD present. No tracheal deviation present. No thyromegaly present.  Cardiovascular: Normal rate, regular rhythm and intact distal pulses.  Exam reveals no gallop and no friction rub.   No murmur heard. Pulmonary/Chest: Effort normal. No respiratory distress. He has no wheezes. He has rales. He exhibits no tenderness.  bibasal crackles +  Abdominal: Soft. Bowel sounds are normal. He exhibits no distension and no mass. There is no tenderness. There is no rebound and no guarding.    Musculoskeletal: Normal range of motion. He exhibits no edema and no tenderness.  Rt knee in brace and below patella is prominence that is tender  Lymphadenopathy:    He has no cervical adenopathy.  Neurological: He is alert and oriented to person, place, and time. He has normal reflexes. No cranial nerve deficit. Coordination normal.  Skin: Skin is warm and dry. No rash noted. He is not diaphoretic. No erythema. No pallor.  Psychiatric: He has a normal mood and affect. His behavior is normal. Judgment and thought content normal.    Filed Vitals:   10/07/13 1343  BP: 130/78  Pulse: 60  Height: 5' 5.5" (1.664 m)  Weight: 177 lb 3.2 oz (80.377 kg)  SpO2: 91%          Assessment & Plan:  Shortness of breath and cough  - I am concerned you might have Interstitial Lung Disease (ILD)  -  There are > 100 varieties of this -  Set up 2L o2 with exertion - To narrow down possibilities and assess severity please do the following tests  - do full PFT breathing test (choose a location depending on schedule and convenience)  -  do High Resolution CT chest wo contrast (only Dr Rosario Jacks or Dr Weber Cooks to read)  - do autoimmune panel: Serum: ESR, ANA, DS-DNA, RF, anti-CCP, ssA, ssB, scl-70, ANCA, MPO and PR-3 antibodies, Total CK,  Aldolase,  Hypersensitivity Pneumonitis Panel  #Cough   - likely due to high pre test prob of ILD  - ACE inhibitor could be making it worse - will address ACE inhibitor issue at followup   #Rt knee issue  - see Dr  Hollace Kinnier, DO   #FOllowup  - Before July 15th, 2015

## 2013-10-08 ENCOUNTER — Encounter (INDEPENDENT_AMBULATORY_CARE_PROVIDER_SITE_OTHER): Payer: Self-pay

## 2013-10-08 ENCOUNTER — Ambulatory Visit (HOSPITAL_COMMUNITY)
Admission: RE | Admit: 2013-10-08 | Discharge: 2013-10-08 | Disposition: A | Discharge status: Home / Self Care | Payer: Medicare Other | Source: Ambulatory Visit | Attending: Internal Medicine | Admitting: Internal Medicine

## 2013-10-08 DIAGNOSIS — Z87891 Personal history of nicotine dependence: Secondary | ICD-10-CM | POA: Insufficient documentation

## 2013-10-08 DIAGNOSIS — R0600 Dyspnea, unspecified: Secondary | ICD-10-CM

## 2013-10-08 DIAGNOSIS — R0609 Other forms of dyspnea: Secondary | ICD-10-CM | POA: Insufficient documentation

## 2013-10-08 DIAGNOSIS — R05 Cough: Secondary | ICD-10-CM

## 2013-10-08 DIAGNOSIS — R059 Cough, unspecified: Secondary | ICD-10-CM

## 2013-10-08 DIAGNOSIS — R0989 Other specified symptoms and signs involving the circulatory and respiratory systems: Principal | ICD-10-CM | POA: Insufficient documentation

## 2013-10-08 LAB — CYCLIC CITRUL PEPTIDE ANTIBODY, IGG: Cyclic Citrullin Peptide Ab: <2 U/mL (ref 0.0–5.0)

## 2013-10-08 LAB — ALDOLASE: ALDOLASE: 5.7 U/L (ref <=8.1)

## 2013-10-08 LAB — CK TOTAL AND CKMB (NOT AT ARMC)
CK, MB: 1.1 ng/mL (ref 0.0–5.0)
Total CK: 45 U/L (ref 7–232)

## 2013-10-08 LAB — RHEUMATOID FACTOR

## 2013-10-08 LAB — ANA: Anti Nuclear Antibody(ANA): NEGATIVE

## 2013-10-08 MED ORDER — ALBUTEROL SULFATE (2.5 MG/3ML) 0.083% IN NEBU
2.5000 mg | INHALATION_SOLUTION | Freq: Once | RESPIRATORY_TRACT | Status: AC
  Administered 2013-10-08: 2.5 mg via RESPIRATORY_TRACT

## 2013-10-09 DIAGNOSIS — J849 Interstitial pulmonary disease, unspecified: Secondary | ICD-10-CM | POA: Insufficient documentation

## 2013-10-09 DIAGNOSIS — G4733 Obstructive sleep apnea (adult) (pediatric): Secondary | ICD-10-CM | POA: Insufficient documentation

## 2013-10-09 HISTORY — DX: Obstructive sleep apnea (adult) (pediatric): G47.33

## 2013-10-09 LAB — MPO/PR-3 (ANCA) ANTIBODIES: Serine Protease 3: <1

## 2013-10-09 LAB — ANTI-DNA ANTIBODY, DOUBLE-STRANDED: DS DNA AB: 1 [IU]/mL

## 2013-10-09 LAB — SJOGRENS SYNDROME-B EXTRACTABLE NUCLEAR ANTIBODY: SSB (LA) (ENA) ANTIBODY, IGG: NEGATIVE

## 2013-10-09 LAB — SJOGRENS SYNDROME-A EXTRACTABLE NUCLEAR ANTIBODY: SSA (RO) (ENA) ANTIBODY, IGG: NEGATIVE

## 2013-10-09 LAB — ANTI-SCLERODERMA ANTIBODY: Scleroderma (Scl-70) (ENA) Antibody, IgG: <1

## 2013-10-09 NOTE — Assessment & Plan Note (Signed)
# #  Cough   - likely due to high pre test prob of ILD  - ACE inhibitor could be making it worse - will address ACE inhibitor issue at followup     #FOllowup  - Before July 15th, 2015

## 2013-10-09 NOTE — Assessment & Plan Note (Signed)
#  Shortness of breath and cough  - I am concerned you might have Interstitial Lung Disease (ILD) atleast of moderate severity based on exertional desaturation  -  There are > 100 varieties of this -  Set up 2L o2 with exertion - To narrow down possibilities and assess severity please do the following tests  - do full PFT breathing test (choose a location depending on schedule and convenience)  -  do High Resolution CT chest wo contrast (only Dr Rosario Jacks or Dr Weber Cooks to read)  - do autoimmune panel: Serum: ESR, ANA, DS-DNA, RF, anti-CCP, ssA, ssB, scl-70, ANCA, MPO and PR-3 antibodies, Total CK,  Aldolase,  Hypersensitivity Pneumonitis Panel

## 2013-10-12 ENCOUNTER — Ambulatory Visit (INDEPENDENT_AMBULATORY_CARE_PROVIDER_SITE_OTHER)
Admission: RE | Admit: 2013-10-12 | Discharge: 2013-10-12 | Disposition: A | Discharge status: Home / Self Care | Payer: Medicare Other | Source: Ambulatory Visit | Attending: Internal Medicine | Admitting: Internal Medicine

## 2013-10-12 ENCOUNTER — Telehealth: Payer: Self-pay | Admitting: Internal Medicine

## 2013-10-12 DIAGNOSIS — R059 Cough, unspecified: Secondary | ICD-10-CM

## 2013-10-12 DIAGNOSIS — R05 Cough: Secondary | ICD-10-CM

## 2013-10-12 DIAGNOSIS — R0609 Other forms of dyspnea: Secondary | ICD-10-CM

## 2013-10-12 DIAGNOSIS — R0989 Other specified symptoms and signs involving the circulatory and respiratory systems: Secondary | ICD-10-CM

## 2013-10-12 DIAGNOSIS — R0600 Dyspnea, unspecified: Secondary | ICD-10-CM

## 2013-10-12 NOTE — Telephone Encounter (Signed)
Rec'd from [facility] forward 4 pages to Dr. Marchelle Gearingamaswamy

## 2013-10-13 LAB — PULMONARY FUNCTION TEST
DL/VA % PRED: 48 %
DL/VA: 2.06 ml/min/mmHg/L
DLCO UNC % PRED: 25 %
DLCO unc: 6.44 ml/min/mmHg
FEF 25-75 POST: 2.28 L/s
FEF 25-75 Pre: 1.86 L/sec
FEF2575-%Change-Post: 22 %
FEF2575-%PRED-PRE: 102 %
FEF2575-%Pred-Post: 125 %
FEV1-%CHANGE-POST: 6 %
FEV1-%Pred-Post: 78 %
FEV1-%Pred-Pre: 73 %
FEV1-Post: 1.94 L
FEV1-Pre: 1.82 L
FEV1FVC-%Change-Post: 5 %
FEV1FVC-%Pred-Pre: 109 %
FEV6-%CHANGE-POST: 0 %
FEV6-%PRED-POST: 71 %
FEV6-%PRED-PRE: 70 %
FEV6-POST: 2.28 L
FEV6-PRE: 2.27 L
FEV6FVC-%PRED-POST: 107 %
FEV6FVC-%PRED-PRE: 107 %
FVC-%Change-Post: 0 %
FVC-%PRED-POST: 66 %
FVC-%PRED-PRE: 65 %
FVC-POST: 2.28 L
FVC-Pre: 2.27 L
Post FEV1/FVC ratio: 85 %
Post FEV6/FVC ratio: 100 %
Pre FEV1/FVC ratio: 80 %
Pre FEV6/FVC Ratio: 100 %
RV % pred: 53 %
RV: 1.21 L
TLC % pred: 60 %
TLC: 3.64 L

## 2013-10-13 LAB — HYPERSENSITIVITY PNUEMONITIS PROFILE

## 2013-11-10 ENCOUNTER — Encounter (INDEPENDENT_AMBULATORY_CARE_PROVIDER_SITE_OTHER): Payer: Self-pay

## 2013-11-10 ENCOUNTER — Telehealth: Payer: Self-pay | Admitting: Internal Medicine

## 2013-11-10 ENCOUNTER — Encounter: Payer: Self-pay | Admitting: Internal Medicine

## 2013-11-10 ENCOUNTER — Ambulatory Visit (INDEPENDENT_AMBULATORY_CARE_PROVIDER_SITE_OTHER): Payer: Medicare Other | Admitting: Internal Medicine

## 2013-11-10 VITALS — BP 132/88 | HR 56 | Ht 66.0 in | Wt 174.0 lb

## 2013-11-10 DIAGNOSIS — R0989 Other specified symptoms and signs involving the circulatory and respiratory systems: Secondary | ICD-10-CM

## 2013-11-10 DIAGNOSIS — R0600 Dyspnea, unspecified: Secondary | ICD-10-CM

## 2013-11-10 DIAGNOSIS — J849 Interstitial pulmonary disease, unspecified: Secondary | ICD-10-CM

## 2013-11-10 DIAGNOSIS — J8410 Pulmonary fibrosis, unspecified: Secondary | ICD-10-CM

## 2013-11-10 DIAGNOSIS — R0609 Other forms of dyspnea: Secondary | ICD-10-CM

## 2013-11-10 NOTE — Patient Instructions (Addendum)
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

## 2013-11-10 NOTE — Progress Notes (Signed)
Subjective:    Patient ID: Stanley Sullivan, male    DOB: 1941-04-24, 73 y.o.   MRN: [id number]  HPI PCP REED, TIFFANY, DO  HPI  IOV 10/07/2013  Chief Complaint  Patient presents with  . Pulmonary Consult    Referred by Dr. Renato Gailseed for dyspnea on exertion. Denies CP. C/o mild productive cough.    73 year old [place]  immigrant to the [place]. Living in [place]  since the 1970s and 1980s. 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 . 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 He is accompanied by his son who gives a better history due to language issues   for the last few years has had insidious onset of dyspnea associated with cough. With symptoms of progressive especially in the last 6 months particularly dyspnea. This visit it is severe. Relieved by rest. Business noticeable class II to class III levels of activity such as climbing a flight of stairs or walking from the car to the office. There is no associated chest pain. The cough is dry and cousin both a day and night. Cough is associated with ACE inhibitor intake. Symptoms are both associated with acid reflux disease. Is no history of associated collagen vascular disease but he does have gout and he has a right knee brace. There is no obvious postnasal drainage. Son is aware that that might be c concern of interstitial lung disease    Most recently his right infrapatellar area is tender and this has not been reported primary care   Dyspne and ILD Relevant history -  reports that he quit smoking about 20 years ago. His smoking use included Cigarettes. He has a 7.5 pack-year smoking history. He has quit using smokeless tobacco. His smokeless tobacco use included Chew. 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 Denies mold exposure - Chest x-ray December 2014:  Radiologist's concern about the possibility of interstitial lung disease and have suggested CT scan - Creatinine 1.5 mg percent with a GFR of 40 08/05/2013 - No evidence of anemia with a hemoglobin of 14.3 g percent apical doesn't 15 - Nuclear medicine cardiac [facility]: EKG with poor R-wave progression but stress test showed ejection fraction of 57% but no evidence of ischemia or scar at rest and with exertion   Walking desat test 185 feet x 3 laps on RA: Walked very slow and half lap - desat to 84% on RA     OV 11/10/2013  Chief Complaint  Patient presents with  . Follow-up    Pt states his breathing is doing a little better than last OV. Pt is able to do more activities without becoming as SOB. Denies SOB and CP/tightness.     Interstitial lung disease workup here to review results. Presents with his son. The patient speaks gujarati and interpretation by son  Results   High-resolution CT scan of the chest 10/12/2013: Show some reticular abnormalities. It is not consistent with UIP. Findings are so subtle that we do not even know if it is possible UIP but is not inconsistent with early UIP. Autoimmune panel 10/07/2048: Is negative suggesting IPF still in mix of differential diagnosis   PFT 10/08/2013 and pulmonary function test c/w moderate to severe severity interstitial lung disease  with an FVC of 2.3 L or 65%. Total lung capacity of 2.6 L or 60%. And DLCO 6.4/25%    Today he states that he actually feels better but objectively he still has crackles and does desaturate on exertion. He also prefers  portable oxygen.   Review of Systems  Constitutional: Negative for fever and unexpected weight change.  HENT: Negative for congestion, dental problem, ear pain, nosebleeds, postnasal drip, rhinorrhea, sinus pressure, sneezing, sore throat and trouble swallowing.   Eyes: Negative for redness and itching.  Respiratory: Positive for shortness  of breath. Negative for cough, chest tightness and wheezing.   Cardiovascular: Negative for palpitations and leg swelling.  Gastrointestinal: Negative for nausea and vomiting.  Genitourinary: Negative for dysuria.  Musculoskeletal: Negative for joint swelling.  Skin: Negative for rash.  Neurological: Negative for headaches.  Hematological: Does not bruise/bleed easily.  Psychiatric/Behavioral: Negative for dysphoric mood. The patient is not nervous/anxious.        Objective:   Physical Exam  Filed Vitals:   11/10/13 0947  BP: 132/88  Pulse: 56  Height: 5\' 6"  (1.676 m)  Weight: 174 lb (78.926 kg)  SpO2: 95%    Discussion only   CRackles still + Deaturated o nwalking 1-2 laps again +      Assessment & Plan:  #You  have Interstitial Lung Disease (ILD) atleast of moderate severity   -  There are > 100 varieties of this but I Am most concerned that you might have IPF -  Set up 2L o2 with exertion; change to portable system today 11/10/2013 - Caues still not known  -REfer Dr Tyrone Sage for surgical lung biopsy as discussed - Thanks for agreeing on research study called BRAVE; will keep you posted  (might not be eligible due to language barrier)   #Cough   - likely due to high pre test prob of ILD  - ACE inhibitor could be making it worse - will address ACE inhibitor issue at followup   #Rt knee issue  - see Dr Renato Gails, TIFFANY, DO   #FOllowup  -3 weeks after surgical lung biopsy

## 2013-11-10 NOTE — Telephone Encounter (Signed)
Hi Ed  I am sending Stanley Sullivan your way for surgical lung biopsy. I am back from vacation 11/28/13 and would like the procedure done aftrer that. His son and he have preliminary agreed for the BRAVE - BAL and transbronchial biopsy research study. So, if you can do this after 8/.1/15 would be great. WE got study through IRB Today but do not have a formal date on study activation yet. I will be in touch with you abut that but hopefully next 2 weeks  I amon email all the time and epic peridocially till I return  Thanks   Dr. Kalman ShanMurali Oshen Wlodarczyk, M.D., [facility] Pulmonary and Critical Care Medicine Staff Physician [facility] [place] [facility] Pager: [phone], If no answer or between  15:00h - 7:00h: call [phone]  11/10/2013 10:17 AM

## 2013-11-12 NOTE — Assessment & Plan Note (Addendum)
#  You  have Interstitial Lung Disease (ILD) atleast of moderate severity   -  There are > 100 varieties of this but most concerning for IPF -  Set up 2L o2 with exertion; change to portable system today 11/10/2013 - Caus still not known  -REfer Dr Tyrone SageGerhardt for surgical lung biopsy as discussed (risks, benefit, limitations discussed) - Thanks for agreeing on research study called BRAVE; will keep you posted   #Cough   - likely due to high pre test prob of ILD  - ACE inhibitor could be making it worse - will address ACE inhibitor issue at followup     #FOllowup  -3 weeks after surgical lung biopsy   > 50% of this > 25 min visit spent in face to face counseling (15 min visit converted to 25 min)

## 2013-11-13 ENCOUNTER — Telehealth: Payer: Self-pay | Admitting: Internal Medicine

## 2013-11-13 ENCOUNTER — Encounter: Payer: Self-pay | Admitting: Cardiothoracic Surgery

## 2013-11-13 ENCOUNTER — Institutional Professional Consult (permissible substitution) (INDEPENDENT_AMBULATORY_CARE_PROVIDER_SITE_OTHER): Payer: Medicare Other | Admitting: Cardiothoracic Surgery

## 2013-11-13 VITALS — BP 164/71 | HR 58 | Resp 20 | Ht 66.0 in | Wt 174.0 lb

## 2013-11-13 DIAGNOSIS — J849 Interstitial pulmonary disease, unspecified: Secondary | ICD-10-CM

## 2013-11-13 DIAGNOSIS — J8410 Pulmonary fibrosis, unspecified: Secondary | ICD-10-CM

## 2013-11-13 DIAGNOSIS — R0609 Other forms of dyspnea: Principal | ICD-10-CM

## 2013-11-13 NOTE — Telephone Encounter (Signed)
[facility] calling to clarify an order that was placed at last OV Order states "Set up 2L o2 with exertion; change to portable system"  Pt already has portable system at home.  Did Dr Marchelle Gearingamaswamy possibly mean portable oxygen concentrator? (POC?)  From notes in chart, pt was currently using O2 and was requesting to have order placed for more portable O2 Resent order to state Set up 2L o2 with exertion; change to portable concentrator Kim aware that order to be sent Nothing further needed.

## 2013-11-13 NOTE — Progress Notes (Signed)
[address]       [name] [address]             [phone]                    Stanley Sullivan [facility] Medical Record #[id number] Date of Birth: [birth year]  Referring: Stanley Shan, MD Primary Care: Stanley Spikes, DO  Chief Complaint:   Short of breath  History of Present Illness:    Stanley Sullivan 73 y.o. male is seen in the office  today for consideration of Lung Biopsy. Patient has had SOB for about a yesr, worse over the past 3-4 months.   73 year old [place] immigrant to the [place] ( son is with him and translates) . Living in [place] since the 1970s . 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 . In the 36s and 1970s lived in [place] and worked as a Engineer, mining and also TEFL teacher. for the last few years has had insidious onset of dyspnea associated with cough. There is no associated chest pain. The cough is dry and cousin both a day and night. Cough is associated with ACE inhibitor intake. Symptoms are both associated with acid reflux disease. Is no history of associated collagen vascular disease but he does have gout and he has a right knee brace. There is no obvious postnasal drainage.  reports that he quit smoking about 20 years ago. His smoking use included Cigarettes. He has a 7.5 pack-year smoking history. He has quit using smokeless tobacco. His smokeless tobacco use included Chew.  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 Denies mold exposure   Creatinine 1.5 mg percent with a GFR of 40 08/05/2013  Nuclear medicine cardiac stress test January 2015 at [facility] cardiovascular: EKG with poor R-wave progression but stress test showed ejection fraction of 57% but no evidence of ischemia or scar at rest and with exertion   Walking desat test 185 feet x 3 laps on RA: Walked very slow and half lap - desat to 84% on RA       Current Activity/ Functional Status:  Patient is independent with mobility/ambulation, transfers, ADL's, IADL's.   Zubrod Score: At the time of surgery this patient's most appropriate activity status/level should be described as: []     0    Normal activity, no symptoms []     1    Restricted in physical strenuous activity but ambulatory, able to do out light work [x]     2    Ambulatory and capable of self care, unable to do work activities, up and about               >50 % of waking hours                              []     3    Only limited self care, in bed greater than 50% of waking hours []     4    Completely disabled, no self care, confined to bed or chair []     5    Moribund   Past Medical History  Diagnosis Date  . Osteoarthrosis, unspecified whether generalized or localized, unspecified site   . Pain in joint, lower leg   .  Chronic kidney disease, stage III (moderate)   . Other specified visual disturbances   . Unspecified disorder of kidney and ureter   . Other abnormal glucose   . Obesity, unspecified   . Urticaria, unspecified   . Allergy, unspecified not elsewhere classified   . Pain in limb   . Gout, unspecified   . Other and unspecified hyperlipidemia   . External hemorrhoids without mention of complication   . Organic sleep apnea, unspecified   . Unspecified essential hypertension   . Hypertrophy of prostate without urinary obstruction and other lower urinary tract symptoms (LUTS)   . Other atopic dermatitis and related conditions     Past Surgical History  Procedure Laterality Date  . Cataract extraction      FAMILY HISTORY: MOTHER AND FATHER , UNKNOWN MEDICAL HISTORY  History   Social History  . Marital Status: Married    Spouse Name: N/A    Number of Children: N/A  . Years of Education: N/A   Occupational History  . retired    Social History Main Topics  . Smoking status: Former Smoker -- 0.50 packs/day for 15 years    Types:  Cigarettes    Quit date: 04/30/1993  . Smokeless tobacco: Former NeurosurgeonUser    Types: Chew  . Alcohol Use: Yes     Comment: occassional  . Drug Use: No  . Sexual Activity: Not on file   Other Topics Concern  . Not on file   Social History Narrative  . No narrative on file    History  Smoking status  . Former Smoker -- 0.50 packs/day for 15 years  . Types: Cigarettes  . Quit date: 04/30/1993  Smokeless tobacco  . Former NeurosurgeonUser  . Types: Chew    History  Alcohol Use  . Yes    Comment: occassional     No Known Allergies  Current Outpatient Prescriptions  Medication Sig Dispense Refill  . acetaminophen (TYLENOL) 500 MG tablet Take 500 mg by mouth every 6 (six) hours as needed for pain. Take 1-2 tablets by mouth every 6-8 hours as needed for pain.      Marland Kitchen. allopurinol (ZYLOPRIM) 100 MG tablet Take 1 tablet (100 mg total) by mouth daily.  30 tablet  5  . atenolol (TENORMIN) 100 MG tablet TAKE 1 TABLET BY MOUTH ONCE DAILY FOR BLOOD PRESSURE  30 tablet  5  . clobetasol ointment (TEMOVATE) 0.05 % Apply 1 application topically 2 (two) times daily. Apply twice a day on right leg rash.      . Dextromethorphan-Guaifenesin (MUCINEX DM MAXIMUM STRENGTH) 60-1200 MG TB12 Take 1 tablet by mouth every 12 (twelve) hours. Take 1 tablet every 12 hours.      . fluticasone (FLONASE) 50 MCG/ACT nasal spray Place 1 spray into the nose 2 (two) times daily. One spray in each nostril twice daily.      . furosemide (LASIX) 20 MG tablet Take 20 mg by mouth daily.      Marland Kitchen. glucose blood test strip Test glucose daily  100 each  3  . Lancets (FREESTYLE) lancets Use as instructed  100 each  1  . Linaclotide (LINZESS) 145 MCG CAPS capsule Take 1 capsule (145 mcg total) by mouth daily.  30 capsule  3  . linagliptin (TRADJENTA) 5 MG TABS tablet Take 1 tablet (5 mg total) by mouth daily. For diabetes  30 tablet  3  . lisinopril (PRINIVIL,ZESTRIL) 5 MG tablet Take 1 tablet (5 mg total) by mouth  daily. For blood pressure  and diabetes  30 tablet  3  . meloxicam (MOBIC) 7.5 MG tablet Take 1 tablet (7.5 mg total) by mouth daily.  30 tablet  0  . mometasone (ELOCON) 0.1 % cream Apply 1 application topically daily.      . simvastatin (ZOCOR) 40 MG tablet TAKE 1 TABLET BY MOUTH DAILY FOR CHOLESTEROL  30 tablet  5  . tamsulosin (FLOMAX) 0.4 MG CAPS capsule TAKE 1 CAPSULE BY MOUTH ONCE A DAY  30 capsule  6   No current facility-administered medications for this visit.     Review of Systems:     Cardiac Review of Systems: Y or N  Chest Pain [  n  ]  Resting SOB [ y  ] Exertional SOB  Cove.Etienne ]  Orthopnea [ n ]   Pedal Edema [ y  ]    Palpitations [ n ] Syncope  [  ]   Presyncope [ n  ]  General Review of Systems: [Y] = yes [  ]=no Constitional: recent weight change [n  ];  Wt loss over the last 3 months [   ] anorexia [  ]; fatigue [  ]; nausea [  ]; night sweats [  ]; fever [  ]; or chills [  ];          Dental: poor dentition[  ]; Last Dentist visit:   Eye : blurred vision [  ]; diplopia [   ]; vision changes [  ];  Amaurosis fugax[  ]; Resp: cough [  ];  wheezing[  ];  hemoptysis[  ]; shortness of breath[  ]; paroxysmal nocturnal dyspnea[  ]; dyspnea on exertion[  ]; or orthopnea[  ];  GI:  gallstones[  ], vomiting[  ];  dysphagia[  ]; melena[  ];  hematochezia [  ]; heartburn[  ];   Hx of  Colonoscopy[  ]; GU: kidney stones [  ]; hematuria[  ];   dysuria [  ];  nocturia[  ];  history of     obstruction [  ]; urinary frequency [  ]             Skin: rash, swelling[  ];, hair loss[  ];  peripheral edema[  ];  or itching[  ]; Musculosketetal: myalgias[  ];  joint swelling[ y right knee ];  joint erythema[  ];  joint pain[  ];  back pain[  ];  Heme/Lymph: bruising[  ];  bleeding[  ];  anemia[  ];  Neuro: TIA[  ];  headaches[  ];  stroke[  ];  vertigo[  ];  seizures[  ];   paresthesias[  ];  difficulty walking[  ];  Psych:depression[  ]; anxiety[  ];  Endocrine: diabetes[  ];  thyroid dysfunction[  ];  Immunizations:  Flu up to date [ Y ]; Pneumococcal up to date [last recorded 2008  ];  Other:sleep apneia , uses home O2  Physical Exam: BP 164/71  Pulse 58  Resp 20  Ht 5\' 6"  (1.676 m)  Wt 174 lb (78.926 kg)  BMI 28.10 kg/m2  SpO2 95%  PHYSICAL EXAMINATION:  General appearance: alert, cooperative and appears stated age Neurologic: intact Heart: regular rate and rhythm, S1, S2 normal, no murmur, click, rub or gallop Lungs: mild crackes bilaterial, no wheezing Abdomen: soft, non-tender; bowel sounds normal; no masses,  no organomegaly Extremities: extremities normal, atraumatic, no cyanosis or edema and Homans sign is negative, no sign of  DVT No carotid bruits, no cervical or axillary adenopathy   Diagnostic Studies & Laboratory data:     Recent Radiology Findings:  Trudie Reed, MD Thu Oct 22, 2013 12:05:03 PM EDT       ADDENDUM REPORT: 10/22/2013 12:02  ADDENDUM:  Because of persistent clinical concern for interstitial lung  disease, I was asked review these images again for subtle signs of  interstitial lung disease. Indeed, image 16 of series 3 and image 23  of series 9 demonstrate areas of subtle subpleural reticulation in  the periphery of the left lower lobe. Likewise, there may be some  very subtle subpleural reticulation in the periphery of the right  lower lobe on image 22 of series 9. These findings are nonspecific,  but given the strong clinical concern for interstitial lung disease,  these may certainly represent a very early manifestation of  interstitial lung disease. Accordingly, any future followup study  should be performed of high-resolution chest CT to assess for  temporal changes in the appearance of the lung parenchyma.  Electronically Signed  By: Trudie Reed M.D.  On: 10/22/2013 12:02       Study Result    CLINICAL DATA: Shortness of breath. Evaluate for interstitial lung  disease.  EXAM:  CT CHEST WITHOUT CONTRAST  TECHNIQUE:  Multidetector CT  imaging of the chest was performed following the  standard protocol without intravenous contrast. High resolution  imaging of the lungs, as well as inspiratory and expiratory imaging,  was performed.  COMPARISON: No priors.  FINDINGS:  Mediastinum: Heart size is normal. There is no significant  pericardial fluid, thickening or pericardial calcification. There is  atherosclerosis of the thoracic aorta, the great vessels of the  mediastinum and the coronary arteries, including calcified  atherosclerotic plaque in the left main, left anterior descending,  left circumflex and right coronary arteries. Calcifications of the  aortic valve. Multiple borderline and mildly enlarged mediastinal  lymph nodes measuring up to 1 cm in short axis in the prevascular  and right paratracheal stations. Esophagus is unremarkable in  appearance.  Lungs/Pleura: In the right middle lobe there is an irregular-shaped  1.3 x 1.2 cm ground-glass attenuation nodule which has a central 4  mm solid focus (images 35 of series 6 and 2 respectively). Tiny 2 mm  satellite nodule in the right middle lobe (image 34 of series 6).  Several other scattered 3-4 mm nodules on the right lung, largest of  which is in the lateral aspect of the right upper lobe measuring 4  mm (image 17 of series 6). No acute consolidative airspace disease.  No pleural effusions. High-resolution images demonstrate no other  areas of significant ground-glass attenuation, subpleural  reticulation, parenchymal banding, traction bronchiectasis or frank  honeycombing. Inspiratory and expiratory imaging is unremarkable.  Upper Abdomen: Unremarkable.  Musculoskeletal: There are no aggressive appearing lytic or blastic  lesions noted in the visualized portions of the skeleton.  IMPRESSION:  1. No findings to suggest an underlying interstitial lung disease at  this time.  2. Multiple small pulmonary nodules noted in the right lung, the  most conspicuous  of which is a mixed solid and subsolid nodule in  the right middle lobe, which has a ground-glass attenuation  component measuring 1.3 x 1.2 cm, and a tiny 4 mm central solid  component. Initial follow-up by chest CT without contrast is  recommended in 3 months to confirm persistence. This recommendation  follows the consensus statement: Recommendations for the Management  of Subsolid Pulmonary Nodules Detected at CT: A Statement from the  Fleischner Society as published in Radiology 2013; 266:304-317.  3. Atherosclerosis, including left main and 3 vessel coronary artery  disease. Assessment for potential risk factor modification, dietary  therapy or pharmacologic therapy may be warranted, if clinically  indicated.  4. Multiple borderline enlarged and minimally enlarged mediastinal  lymph nodes. These are nonspecific. Attention on the short-term  follow-up chest CT in 3 months is recommended.  Electronically Signed:  By: Trudie Reed M.D.  On: 10/12/2013 13:12       Recent Lab Findings: Lab Results  Component Value Date   WBC 7.7 08/24/2013   HGB 14.3 08/24/2013   HCT 43.3 08/24/2013   PLT 228 08/24/2013   GLUCOSE 97 08/24/2013   TRIG 92 08/24/2013   HDL 46 08/24/2013   LDLCALC 137* 08/24/2013   ALT 6 08/24/2013   AST 15 08/24/2013   NA 143 08/24/2013   K 5.4* 08/24/2013   CL 107 08/24/2013   CREATININE 1.43* 08/24/2013   BUN 17 08/24/2013   CO2 23 08/24/2013   HGBA1C 6.9* 08/24/2013   Hi Ed  I am sending Stanley Sullivan your way for surgical lung biopsy. I am back from vacation 11/28/13 and would like the procedure done aftrer that. His son and he have preliminary agreed for the BRAVE - BAL and transbronchial biopsy research study. So, if you can do this after 8/.1/15 would be great. WE got study through IRB Today but do not have a formal date on study activation yet. I will be in touch with you abut that but hopefully next 2 weeks  I amon email all the time and epic peridocially till I  return  Thanks  Dr. Kalman Sullivan, M.D., [facility].C.P    Assessment / Plan:   Possible interstitial lung disease, but CT is not impressive as to side most involved for  BX, does have multiple small pulmonary nodules that need to be followed. I have explained to the patient the VATS approach to lung biopsy, anticipated stay in the hospital.  The says he does not want to do any study, and does not want a biopsy. The patient and son will make a follow up visit with Dr Marchelle Gearing before deciding if he wants to proceed with lung Biopsy. If patient wishes to proceed He will call back     I spent 55 minutes counseling the patient face to face. The total time spent in the appointment was .  Delight Ovens MD      [address] [place] [place] [address] Office [phone]   Beeper [phone]  11/15/2013 6:53 PM

## 2013-11-17 ENCOUNTER — Other Ambulatory Visit: Payer: Self-pay | Admitting: Nurse Practitioner

## 2013-11-17 ENCOUNTER — Other Ambulatory Visit: Payer: Self-pay | Admitting: Internal Medicine

## 2013-11-30 ENCOUNTER — Telehealth: Payer: Self-pay | Admitting: Internal Medicine

## 2013-11-30 NOTE — Telephone Encounter (Addendum)
Stanley Sullivan  Please give first available for patient . The saw CVTS and refused biopsy . I spoke to wife 12/01/2013 and she says this is because patient daughter was pregnant. They are willing to come and discuss situation with me again  Thanks  M

## 2013-12-01 ENCOUNTER — Telehealth: Payer: Self-pay | Admitting: Internal Medicine

## 2013-12-01 NOTE — Telephone Encounter (Signed)
dddddd

## 2013-12-02 NOTE — Telephone Encounter (Signed)
Pt states [place] is leaving for [place] for a family emergency for 2 months and is leaving on 12/07/13. Pt states he will call when they return to schedule the OV then.   Will forward to MR as an FYI.

## 2013-12-03 NOTE — Telephone Encounter (Signed)
Please get me his son's cell number  Thanks  Dr. Kalman ShanMurali Felicia Both, M.D., [facility].C.P Pulmonary and Critical Care Medicine Staff Physician [facility] [place] [facility] Pager: [phone], If no answer or between  15:00h - 7:00h: call [phone]  12/03/2013 8:11 AM

## 2013-12-03 NOTE — Telephone Encounter (Signed)
Called and spoke with pt's wife. [name], pt's son, cell phone number: [phone]. Pt's wife stated [name] can best be reached after 1pm.

## 2013-12-09 NOTE — Telephone Encounter (Signed)
Spoke to son - asked him reasons for refusal of surgical lunmg biopsy of dad. He said he was busy and would call back in 24h.   Dr. Kalman ShanMurali Carlton Sweaney, M.D., [facility].P Pulmonary and Critical Care Medicine Staff Physician [facility] [place] [facility] Pager: [phone], If no answer or between  15:00h - 7:00h: call [phone]  12/09/2013 3:55 PM

## 2014-01-07 ENCOUNTER — Ambulatory Visit (INDEPENDENT_AMBULATORY_CARE_PROVIDER_SITE_OTHER): Payer: Medicare Other | Admitting: Nurse Practitioner

## 2014-01-07 ENCOUNTER — Encounter: Payer: Self-pay | Admitting: Nurse Practitioner

## 2014-01-07 VITALS — BP 140/92 | HR 88 | Temp 97.7°F | Resp 10 | Wt 170.0 lb

## 2014-01-07 DIAGNOSIS — M109 Gout, unspecified: Secondary | ICD-10-CM

## 2014-01-07 DIAGNOSIS — I10 Essential (primary) hypertension: Secondary | ICD-10-CM

## 2014-01-07 MED ORDER — ALLOPURINOL 100 MG PO TABS: 100.0000 mg | ORAL_TABLET | Freq: Every day | ORAL | Status: DC

## 2014-01-07 MED ORDER — TAMSULOSIN HCL 0.4 MG PO CAPS: ORAL_CAPSULE | ORAL | Status: DC

## 2014-01-07 MED ORDER — SIMVASTATIN 40 MG PO TABS: ORAL_TABLET | ORAL | Status: DC

## 2014-01-07 MED ORDER — LISINOPRIL 5 MG PO TABS: 5.0000 mg | ORAL_TABLET | Freq: Every day | ORAL | Status: DC

## 2014-01-07 MED ORDER — COLCHICINE 0.6 MG PO TABS: 0.6000 mg | ORAL_TABLET | Freq: Every day | ORAL | Status: DC

## 2014-01-07 NOTE — Patient Instructions (Addendum)
Will give new prescription for colcrys take 2 tablets when you get the prescription and then take 1 in an hour Starting tomorrow take 1 tablet twice daily and cont this for 6 months  Start allopurinol twice daily every day      Gout Gout is an inflammatory arthritis caused by a buildup of uric acid crystals in the joints. Uric acid is a chemical that is normally present in the blood. When the level of uric acid in the blood is too high it can form crystals that deposit in your joints and tissues. This causes joint redness, soreness, and swelling (inflammation). Repeat attacks are common. Over time, uric acid crystals can form into masses (tophi) near a joint, destroying bone and causing disfigurement. Gout is treatable and often preventable. CAUSES  The disease begins with elevated levels of uric acid in the blood. Uric acid is produced by your body when it breaks down a naturally found substance called purines. Certain foods you eat, such as meats and fish, contain high amounts of purines. Causes of an elevated uric acid level include:  Being passed down from parent to child (heredity).  Diseases that cause increased uric acid production (such as obesity, psoriasis, and certain cancers).  Excessive alcohol use.  Diet, especially diets rich in meat and seafood.  Medicines, including certain cancer-fighting medicines (chemotherapy), water pills (diuretics), and aspirin.  Chronic kidney disease. The kidneys are no longer able to remove uric acid well.  Problems with metabolism. Conditions strongly associated with gout include:  Obesity.  High blood pressure.  High cholesterol.  Diabetes. Not everyone with elevated uric acid levels gets gout. It is not understood why some people get gout and others do not. Surgery, joint injury, and eating too much of certain foods are some of the factors that can lead to gout attacks. SYMPTOMS   An attack of gout comes on quickly. It causes intense  pain with redness, swelling, and warmth in a joint.  Fever can occur.  Often, only one joint is involved. Certain joints are more commonly involved:  Base of the big toe.  Knee.  Ankle.  Wrist.  Finger. Without treatment, an attack usually goes away in a few days to weeks. Between attacks, you usually will not have symptoms, which is different from many other forms of arthritis. DIAGNOSIS  Your caregiver will suspect gout based on your symptoms and exam. In some cases, tests may be recommended. The tests may include:  Blood tests.  Urine tests.  X-rays.  Joint fluid exam. This exam requires a needle to remove fluid from the joint (arthrocentesis). Using a microscope, gout is confirmed when uric acid crystals are seen in the joint fluid. TREATMENT  There are two phases to gout treatment: treating the sudden onset (acute) attack and preventing attacks (prophylaxis).  Treatment of an Acute Attack.  Medicines are used. These include anti-inflammatory medicines or steroid medicines.  An injection of steroid medicine into the affected joint is sometimes necessary.  The painful joint is rested. Movement can worsen the arthritis.  You may use warm or cold treatments on painful joints, depending which works best for you.  Treatment to Prevent Attacks.  If you suffer from frequent gout attacks, your caregiver may advise preventive medicine. These medicines are started after the acute attack subsides. These medicines either help your kidneys eliminate uric acid from your body or decrease your uric acid production. You may need to stay on these medicines for a very long time.  The early phase of treatment with preventive medicine can be associated with an increase in acute gout attacks. For this reason, during the first few months of treatment, your caregiver may also advise you to take medicines usually used for acute gout treatment. Be sure you understand your caregiver's directions.  Your caregiver may make several adjustments to your medicine dose before these medicines are effective.  Discuss dietary treatment with your caregiver or dietitian. Alcohol and drinks high in sugar and fructose and foods such as meat, poultry, and seafood can increase uric acid levels. Your caregiver or dietitian can advise you on drinks and foods that should be limited. HOME CARE INSTRUCTIONS   Do not take aspirin to relieve pain. This raises uric acid levels.  Only take over-the-counter or prescription medicines for pain, discomfort, or fever as directed by your caregiver.  Rest the joint as much as possible. When in bed, keep sheets and blankets off painful areas.  Keep the affected joint raised (elevated).  Apply warm or cold treatments to painful joints. Use of warm or cold treatments depends on which works best for you.  Use crutches if the painful joint is in your leg.  Drink enough fluids to keep your urine clear or pale yellow. This helps your body get rid of uric acid. Limit alcohol, sugary drinks, and fructose drinks.  Follow your dietary instructions. Pay careful attention to the amount of protein you eat. Your daily diet should emphasize fruits, vegetables, whole grains, and fat-free or low-fat milk products. Discuss the use of coffee, vitamin C, and cherries with your caregiver or dietitian. These may be helpful in lowering uric acid levels.  Maintain a healthy body weight. SEEK MEDICAL CARE IF:   You develop diarrhea, vomiting, or any side effects from medicines.  You do not feel better in 24 hours, or you are getting worse. SEEK IMMEDIATE MEDICAL CARE IF:   Your joint becomes suddenly more tender, and you have chills or a fever. MAKE SURE YOU:   Understand these instructions.  Will watch your condition.  Will get help right away if you are not doing well or get worse. Document Released: 04/13/2000 Document Revised: 08/31/2013 Document Reviewed:  11/28/2011 [facility] Patient Information 2015 [place], [place]. This information is not intended to replace advice given to you by your health care provider. Make sure you discuss any questions you have with your health care provider.

## 2014-01-07 NOTE — Progress Notes (Signed)
Patient ID: Stanley Sullivan, male   DOB: 08/01/1940, 73 y.o.   MRN: [id number]   Location:  [facility]   No Known Allergies  Chief Complaint  Patient presents with  . Acute Visit    Gout flare up right foot x 3 days     HPI: Patient is a 73 y.o. [place] male seen in the office today for a gout flare. Patient reports swelling, pain, erythema to left ankle for 3 days now. Difficulty walking d/t pain. Denies recent injury. Pt doesn not take either Allopurinol or Colcrys daily as prescribed. Pt began taking allopurinol and colcyrs daily at onset of flare. Pt reports his last flare resolved after taking these for one day last time. Denies any known trigger for the flare, eats no meat, seafood and drinks no alcohol.  Review of Systems:  Review of Systems  Constitutional: Negative.   Respiratory: Negative.   Cardiovascular: Negative.   Musculoskeletal: Positive for joint pain (right ankle and right ITP pain).  Skin: Negative for itching and rash.     Past Medical History  Diagnosis Date  . Osteoarthrosis, unspecified whether generalized or localized, unspecified site   . Pain in joint, lower leg   . Chronic kidney disease, stage III (moderate)   . Other specified visual disturbances   . Unspecified disorder of kidney and ureter   . Other abnormal glucose   . Obesity, unspecified   . Urticaria, unspecified   . Allergy, unspecified not elsewhere classified   . Pain in limb   . Gout, unspecified   . Other and unspecified hyperlipidemia   . External hemorrhoids without mention of complication   . Organic sleep apnea, unspecified   . Unspecified essential hypertension   . Hypertrophy of prostate without urinary obstruction and other lower urinary tract symptoms (LUTS)   . Other atopic dermatitis and related conditions     Past Surgical History  Procedure Laterality Date  . Cataract extraction      Social History:   reports that he quit  smoking about 20 years ago. His smoking use included Cigarettes. He has a 7.5 pack-year smoking history. He has quit using smokeless tobacco. His smokeless tobacco use included Chew. He reports that he drinks alcohol. He reports that he does not use illicit drugs.  History reviewed. No pertinent family history.  Medications: Patient's Medications  New Prescriptions   No medications on file  Previous Medications   ACETAMINOPHEN (TYLENOL) 500 MG TABLET    Take 500 mg by mouth every 6 (six) hours as needed for pain. Take 1-2 tablets by mouth every 6-8 hours as needed for pain.   ALLOPURINOL (ZYLOPRIM) 100 MG TABLET    Take 1 tablet (100 mg total) by mouth daily.   ATENOLOL (TENORMIN) 100 MG TABLET    TAKE 1 TABLET BY MOUTH ONCE DAILY FOR BLOOD PRESSURE   CLOBETASOL OINTMENT (TEMOVATE) 0.05 %    Apply 1 application topically 2 (two) times daily. Apply twice a day on right leg rash.   DEXTROMETHORPHAN-GUAIFENESIN (MUCINEX DM MAXIMUM STRENGTH) 60-1200 MG TB12    Take 1 tablet by mouth every 12 (twelve) hours. Take 1 tablet every 12 hours.   FLUTICASONE (FLONASE) 50 MCG/ACT NASAL SPRAY    Place 1 spray into the nose 2 (two) times daily. One spray in each nostril twice daily.   FUROSEMIDE (LASIX) 20 MG TABLET    Take 20 mg by mouth daily.   GLUCOSE BLOOD TEST  STRIP    Test glucose daily   LANCETS (FREESTYLE) LANCETS    Use as instructed   LINACLOTIDE (LINZESS) 145 MCG CAPS CAPSULE    Take 1 capsule (145 mcg total) by mouth daily.   LINAGLIPTIN (TRADJENTA) 5 MG TABS TABLET    Take 1 tablet (5 mg total) by mouth daily. For diabetes   LISINOPRIL (PRINIVIL,ZESTRIL) 5 MG TABLET    Take 1 tablet (5 mg total) by mouth daily. For blood pressure and diabetes   MELOXICAM (MOBIC) 7.5 MG TABLET    Take 1 tablet (7.5 mg total) by mouth daily.   MOMETASONE (ELOCON) 0.1 % CREAM    Apply 1 application topically daily.   SIMVASTATIN (ZOCOR) 40 MG TABLET    TAKE 1 TABLET BY MOUTH DAILY FOR CHOLESTEROL   TAMSULOSIN  (FLOMAX) 0.4 MG CAPS CAPSULE    TAKE 1 CAPSULE BY MOUTH ONCE A DAY  Modified Medications   No medications on file  Discontinued Medications   No medications on file     Physical Exam: Filed Vitals:   01/07/14 1405  BP: 140/92  Pulse: 88  Temp: 97.7 F (36.5 C)  TempSrc: Oral  Resp: 10  Weight: 170 lb (77.111 kg)  SpO2: 89%  Physical Exam  Constitutional: He appears well-developed and well-nourished.  HENT:  Head: Normocephalic and atraumatic.  Eyes: Conjunctivae and EOM are normal.  Neck: Normal range of motion. Neck supple.  Cardiovascular: Normal rate, regular rhythm and normal heart sounds.  Exam reveals no gallop and no friction rub.   No murmur heard. Pulmonary/Chest: Effort normal and breath sounds normal. No respiratory distress. He has no wheezes.  Abdominal: Soft. Bowel sounds are normal.  Musculoskeletal: Normal range of motion. He exhibits edema (right ankle and foot) and tenderness (right ankle and ITP joints).  Skin: Skin is warm and dry. There is erythema (right ankle and foot).    Labs reviewed: Basic Metabolic Panel:  Recent Labs  16/10/96 1101  NA 143  K 5.4*  CL 107  CO2 23  GLUCOSE 97  BUN 17  CREATININE 1.43*  CALCIUM 8.7   Liver Function Tests:  Recent Labs  08/24/13 1101  AST 15  ALT 6  ALKPHOS 49  BILITOT 0.8  PROT 5.9*  CBC:  Recent Labs  08/24/13 1101  WBC 7.7  NEUTROABS 4.9  HGB 14.3  HCT 43.3  MCV 73*  PLT 228   Lipid Panel:  Recent Labs  08/24/13 1101  HDL 46  LDLCALC 137*  TRIG 92  CHOLHDL 4.4   Lab Results  Component Value Date   URICACID 9.5* 03/31/2013   Lab Results  Component Value Date   ESRSEDRATE 13 10/07/2013   Assessment/Plan 1. Gout - noncompliant with daily Allopurinol - educated to necessity of taking Allopurinol daily for maintenance - colcyrs ordered, loading dose of 1.2 mg today then 0.6 mg daily for 6 months and to restart allopurinol BID  -diet discussed   2. Hypertension Refill  on lisinopril provided    Routine follow up in 1 month with Dr Renato Gails

## 2014-01-29 ENCOUNTER — Ambulatory Visit (INDEPENDENT_AMBULATORY_CARE_PROVIDER_SITE_OTHER): Payer: Medicare Other | Admitting: Internal Medicine

## 2014-01-29 ENCOUNTER — Encounter: Payer: Self-pay | Admitting: Internal Medicine

## 2014-01-29 VITALS — BP 120/82 | HR 60 | Resp 20 | Ht 65.0 in | Wt 176.0 lb

## 2014-01-29 DIAGNOSIS — N183 Chronic kidney disease, stage 3 unspecified: Secondary | ICD-10-CM

## 2014-01-29 DIAGNOSIS — M10071 Idiopathic gout, right ankle and foot: Secondary | ICD-10-CM

## 2014-01-29 DIAGNOSIS — N058 Unspecified nephritic syndrome with other morphologic changes: Secondary | ICD-10-CM

## 2014-01-29 DIAGNOSIS — M109 Gout, unspecified: Secondary | ICD-10-CM

## 2014-01-29 DIAGNOSIS — E1129 Type 2 diabetes mellitus with other diabetic kidney complication: Secondary | ICD-10-CM

## 2014-01-29 DIAGNOSIS — K5900 Constipation, unspecified: Secondary | ICD-10-CM

## 2014-01-29 DIAGNOSIS — I10 Essential (primary) hypertension: Secondary | ICD-10-CM

## 2014-01-29 DIAGNOSIS — K5904 Chronic idiopathic constipation: Secondary | ICD-10-CM

## 2014-01-29 DIAGNOSIS — Z23 Encounter for immunization: Secondary | ICD-10-CM

## 2014-01-29 MED ORDER — COLCHICINE 0.6 MG PO TABS: 0.6000 mg | ORAL_TABLET | Freq: Every day | ORAL | Status: DC

## 2014-01-29 MED ORDER — FUROSEMIDE 20 MG PO TABS: 20.0000 mg | ORAL_TABLET | Freq: Every day | ORAL | Status: DC

## 2014-01-29 MED ORDER — LISINOPRIL 5 MG PO TABS: 5.0000 mg | ORAL_TABLET | Freq: Every day | ORAL | Status: DC

## 2014-01-29 MED ORDER — LINACLOTIDE 145 MCG PO CAPS: 145.0000 ug | ORAL_CAPSULE | Freq: Every day | ORAL | Status: DC

## 2014-01-29 MED ORDER — MELOXICAM 7.5 MG PO TABS: 7.5000 mg | ORAL_TABLET | Freq: Every day | ORAL | Status: DC

## 2014-01-29 MED ORDER — ALLOPURINOL 100 MG PO TABS: 100.0000 mg | ORAL_TABLET | Freq: Every day | ORAL | Status: DC

## 2014-01-29 MED ORDER — TAMSULOSIN HCL 0.4 MG PO CAPS: ORAL_CAPSULE | ORAL | Status: DC

## 2014-01-29 MED ORDER — MOMETASONE FUROATE 0.1 % EX CREA: 1.0000 "application " | TOPICAL_CREAM | Freq: Every day | CUTANEOUS | Status: DC

## 2014-01-29 MED ORDER — FLUTICASONE PROPIONATE 50 MCG/ACT NA SUSP: 1.0000 | Freq: Two times a day (BID) | NASAL | Status: DC

## 2014-01-29 NOTE — Progress Notes (Signed)
Patient ID: Stanley Sullivan, male   DOB: [birth year], 73 y.o.   MRN: [id number]   Location:  [facility] [place] [facility]  Code Status:  Discussed completing living will and hcpoa today  No Known Allergies  Chief Complaint  Patient presents with  . Medical Management of Chronic Issues    1 month follow-up, no recent labs. Patient requesting Uric Acid level check     HPI: Patient is a 73 y.o.  seen in the office today for med mgt of chronic diseases.  He was seen acutely for gout flare a month ago.  He had not been taking his prophylactic medication at that time.  Pain much better from gout.  Was alone at home for a month and his wife doesn't believe him that he took it daily.     1/25, sees Dr. Zetta Bills.    Saw Dr. Marchelle Gearing.  Had oxygen tank ordered.    Review of Systems:  Review of Systems  Constitutional: Negative for fever and chills.  HENT:       Hoarseness  Respiratory: Positive for cough and shortness of breath.   Cardiovascular: Negative for chest pain.  Gastrointestinal: Positive for constipation.  Genitourinary: Negative for dysuria and urgency.  Musculoskeletal: Negative for falls.       No further gout flares  Neurological: Negative for dizziness.  Psychiatric/Behavioral: Negative for memory loss.    Past Medical History  Diagnosis Date  . Osteoarthrosis, unspecified whether generalized or localized, unspecified site   . Pain in joint, lower leg   . Chronic kidney disease, stage III (moderate)   . Other specified visual disturbances   . Unspecified disorder of kidney and ureter   . Other abnormal glucose   . Obesity, unspecified   . Urticaria, unspecified   . Allergy, unspecified not elsewhere classified   . Pain in limb   . Gout, unspecified   . Other and unspecified hyperlipidemia   . External hemorrhoids without mention of complication   . Organic sleep apnea, unspecified   . Unspecified essential hypertension   . Hypertrophy  of prostate without urinary obstruction and other lower urinary tract symptoms (LUTS)   . Other atopic dermatitis and related conditions     Past Surgical History  Procedure Laterality Date  . Cataract extraction      Social History:   reports that he quit smoking about 20 years ago. His smoking use included Cigarettes. He has a 7.5 pack-year smoking history. He has quit using smokeless tobacco. His smokeless tobacco use included Chew. He reports that he drinks alcohol. He reports that he does not use illicit drugs.  History reviewed. No pertinent family history.  Medications: Patient's Medications  New Prescriptions   No medications on file  Previous Medications   ACETAMINOPHEN (TYLENOL) 500 MG TABLET    Take 500 mg by mouth every 6 (six) hours as needed for pain. Take 1-2 tablets by mouth every 6-8 hours as needed for pain.   ALLOPURINOL (ZYLOPRIM) 100 MG TABLET    Take 1 tablet (100 mg total) by mouth daily.   ATENOLOL (TENORMIN) 100 MG TABLET    TAKE 1 TABLET BY MOUTH ONCE DAILY FOR BLOOD PRESSURE   CLOBETASOL OINTMENT (TEMOVATE) 0.05 %    Apply 1 application topically 2 (two) times daily. Apply twice a day on right leg rash.   COLCHICINE 0.6 MG TABLET    Take 1 tablet (0.6 mg total) by mouth daily.   DEXTROMETHORPHAN-GUAIFENESIN (  MUCINEX DM MAXIMUM STRENGTH) 60-1200 MG TB12    Take 1 tablet by mouth every 12 (twelve) hours. Take 1 tablet every 12 hours.   FLUTICASONE (FLONASE) 50 MCG/ACT NASAL SPRAY    Place 1 spray into the nose 2 (two) times daily. One spray in each nostril twice daily.   FUROSEMIDE (LASIX) 20 MG TABLET    Take 20 mg by mouth daily.   GLUCOSE BLOOD TEST STRIP    Test glucose daily   LANCETS (FREESTYLE) LANCETS    Use as instructed   LINACLOTIDE (LINZESS) 145 MCG CAPS CAPSULE    Take 1 capsule (145 mcg total) by mouth daily.   LINAGLIPTIN (TRADJENTA) 5 MG TABS TABLET    Take 1 tablet (5 mg total) by mouth daily. For diabetes   LISINOPRIL (PRINIVIL,ZESTRIL) 5 MG  TABLET    Take 1 tablet (5 mg total) by mouth daily. For blood pressure and diabetes   MELOXICAM (MOBIC) 7.5 MG TABLET    Take 1 tablet (7.5 mg total) by mouth daily.   MOMETASONE (ELOCON) 0.1 % CREAM    Apply 1 application topically daily.   SIMVASTATIN (ZOCOR) 40 MG TABLET    TAKE 1 TABLET BY MOUTH DAILY FOR CHOLESTEROL   TAMSULOSIN (FLOMAX) 0.4 MG CAPS CAPSULE    TAKE 1 CAPSULE BY MOUTH ONCE A DAY  Modified Medications   No medications on file  Discontinued Medications   No medications on file   Physical Exam: Filed Vitals:   01/29/14 1056  BP: 120/82  Pulse: 60  Resp: 20  Height: 5\' 5"  (1.651 m)  Weight: 176 lb (79.833 kg)  Physical Exam  Constitutional: He is oriented to person, place, and time. He appears well-developed and well-nourished. No distress.  Cardiovascular: Normal rate, regular rhythm, normal heart sounds and intact distal pulses.   Pulmonary/Chest: Effort normal and breath sounds normal. No respiratory distress.  Abdominal: Soft. Bowel sounds are normal. He exhibits no distension and no mass. There is no tenderness.  Musculoskeletal: Normal range of motion. He exhibits no edema and no tenderness.  Neurological: He is alert and oriented to person, place, and time.  Skin: Skin is warm and dry.  Psychiatric: He has a normal mood and affect.     Labs reviewed: Basic Metabolic Panel:  Recent Labs  16/10/96 1101  NA 143  K 5.4*  CL 107  CO2 23  GLUCOSE 97  BUN 17  CREATININE 1.43*  CALCIUM 8.7   Liver Function Tests:  Recent Labs  08/24/13 1101  AST 15  ALT 6  ALKPHOS 49  BILITOT 0.8  PROT 5.9*  CBC:  Recent Labs  08/24/13 1101  WBC 7.7  NEUTROABS 4.9  HGB 14.3  HCT 43.3  MCV 73*  PLT 228   Lipid Panel:  Recent Labs  08/24/13 1101  HDL 46  LDLCALC 137*  TRIG 92  CHOLHDL 4.4   Lab Results  Component Value Date   HGBA1C 6.9* 08/24/2013   Assessment/Plan 1. Gout of multiple sites, unspecified cause, unspecified  chronicity -cont colchicine for total 6 mos and allopurinol indefinitely -check uric acid  2. Chronic idiopathic constipation refilled Linaclotide (LINZESS) 145 MCG CAPS capsule; Take 1 capsule (145 mcg total) by mouth daily. For constipation  Dispense: 30 capsule; Refill: 3  3. DM (diabetes mellitus) type II controlled with renal manifestation -cont tradjenta (he has not been taking as directed) -cont lisinopril (is not taking this either)  4. Essential hypertension, benign -cont bp regimen - lisinopril (  PRINIVIL,ZESTRIL) 5 MG tablet; Take 1 tablet (5 mg total) by mouth daily. For blood pressure and diabetes  Dispense: 90 tablet; Refill: 1  5. Chronic kidney disease, stage III (moderate) -f/u with Dr. Allena KatzPatel as planned  6. Acute gout of right ankle, unspecified cause -is to take the colchicine for 6 mos total starting last month - colchicine 0.6 MG tablet; Take 1 tablet (0.6 mg total) by mouth daily. For gout  Dispense: 60 tablet; Refill: 5 - allopurinol (ZYLOPRIM) 100 MG tablet; Take 1 tablet (100 mg total) by mouth daily. For gout  Dispense: 30 tablet; Refill: 5  7. Encounter for immunization -flu shot given  Labs/tests ordered:   Orders Placed This Encounter  Procedures  . Uric Acid  . Basic metabolic panel  . CBC With differential/Platelet  . Hemoglobin A1c  . Lipid panel    Next appt:  3 mos and set up appt for prevnar shot  Janaa Acero L. Roni Friberg, D.O. Geriatrics [facility] [address] [place], [place] [address] Cell Phone (Mon-Fri 8am-5pm):  [phone] On Call:  [phone] & follow prompts after 5pm & weekends Office Phone:  [phone] Office Fax:  [phone]

## 2014-01-29 NOTE — Patient Instructions (Signed)
Cont allopurinol forever Cont colchicine for 5 more months.

## 2014-01-30 LAB — CBC WITH DIFFERENTIAL
Basophils Absolute: 0 10*3/uL (ref 0.0–0.2)
Basos: 0 %
Eos: 6 %
Eosinophils Absolute: 0.6 10*3/uL — ABNORMAL HIGH (ref 0.0–0.4)
HCT: 43.3 % (ref 37.5–51.0)
Hemoglobin: 13.9 g/dL (ref 12.6–17.7)
Immature Grans (Abs): 0 10*3/uL (ref 0.0–0.1)
Immature Granulocytes: 0 %
Lymphocytes Absolute: 1.9 10*3/uL (ref 0.7–3.1)
Lymphs: 18 %
MCH: 23.2 pg — ABNORMAL LOW (ref 26.6–33.0)
MCHC: 32.1 g/dL (ref 31.5–35.7)
MCV: 72 fL — ABNORMAL LOW (ref 79–97)
Monocytes Absolute: 0.9 10*3/uL (ref 0.1–0.9)
Monocytes: 8 %
Neutrophils Absolute: 7.2 10*3/uL — ABNORMAL HIGH (ref 1.4–7.0)
Neutrophils Relative %: 68 %
Platelets: 225 10*3/uL (ref 150–379)
RBC: 6 x10E6/uL — ABNORMAL HIGH (ref 4.14–5.80)
RDW: 16.3 % — ABNORMAL HIGH (ref 12.3–15.4)
WBC: 10.8 10*3/uL (ref 3.4–10.8)

## 2014-01-30 LAB — BASIC METABOLIC PANEL
BUN/Creatinine Ratio: 9 — ABNORMAL LOW (ref 10–22)
BUN: 14 mg/dL (ref 8–27)
CO2: 25 mmol/L (ref 18–29)
Calcium: 9.4 mg/dL (ref 8.6–10.2)
Chloride: 100 mmol/L (ref 97–108)
Creatinine, Ser: 1.48 mg/dL — ABNORMAL HIGH (ref 0.76–1.27)
GFR calc Af Amer: 53 mL/min/{1.73_m2} — ABNORMAL LOW (ref >59)
GFR calc non Af Amer: 46 mL/min/{1.73_m2} — ABNORMAL LOW (ref >59)
Glucose: 92 mg/dL (ref 65–99)
Potassium: 4.7 mmol/L (ref 3.5–5.2)
Sodium: 139 mmol/L (ref 134–144)

## 2014-01-30 LAB — URIC ACID: Uric Acid: 6.3 mg/dL (ref 3.7–8.6)

## 2014-01-30 LAB — LIPID PANEL
Chol/HDL Ratio: 4.1 ratio units (ref 0.0–5.0)
Cholesterol, Total: 195 mg/dL (ref 100–199)
HDL: 48 mg/dL (ref >39)
LDL Calculated: 119 mg/dL — ABNORMAL HIGH (ref 0–99)
Triglycerides: 139 mg/dL (ref 0–149)
VLDL Cholesterol Cal: 28 mg/dL (ref 5–40)

## 2014-01-30 LAB — HEMOGLOBIN A1C
Est. average glucose Bld gHb Est-mCnc: 146 mg/dL
Hgb A1c MFr Bld: 6.7 % — ABNORMAL HIGH (ref 4.8–5.6)

## 2014-02-18 ENCOUNTER — Ambulatory Visit: Payer: Medicare Other | Admitting: Internal Medicine

## 2014-02-25 ENCOUNTER — Ambulatory Visit (INDEPENDENT_AMBULATORY_CARE_PROVIDER_SITE_OTHER): Payer: Medicare Other

## 2014-02-25 DIAGNOSIS — Z23 Encounter for immunization: Secondary | ICD-10-CM

## 2014-02-25 NOTE — Addendum Note (Signed)
Addended by: Maurice SmallBEATTY, Adelita Hone C on: 02/25/2014 03:43 PM   Modules accepted: Orders

## 2014-05-06 ENCOUNTER — Encounter: Payer: Self-pay | Admitting: Internal Medicine

## 2014-05-06 ENCOUNTER — Ambulatory Visit (INDEPENDENT_AMBULATORY_CARE_PROVIDER_SITE_OTHER): Payer: Medicare Other | Admitting: Internal Medicine

## 2014-05-06 VITALS — BP 132/72 | HR 72 | Temp 98.3°F | Resp 18 | Ht 65.0 in | Wt 178.4 lb

## 2014-05-06 DIAGNOSIS — N400 Enlarged prostate without lower urinary tract symptoms: Secondary | ICD-10-CM

## 2014-05-06 DIAGNOSIS — E1129 Type 2 diabetes mellitus with other diabetic kidney complication: Secondary | ICD-10-CM

## 2014-05-06 DIAGNOSIS — N183 Chronic kidney disease, stage 3 unspecified: Secondary | ICD-10-CM

## 2014-05-06 DIAGNOSIS — I10 Essential (primary) hypertension: Secondary | ICD-10-CM

## 2014-05-06 DIAGNOSIS — E785 Hyperlipidemia, unspecified: Secondary | ICD-10-CM

## 2014-05-06 DIAGNOSIS — K5904 Chronic idiopathic constipation: Secondary | ICD-10-CM

## 2014-05-06 DIAGNOSIS — N058 Unspecified nephritic syndrome with other morphologic changes: Secondary | ICD-10-CM

## 2014-05-06 DIAGNOSIS — K5900 Constipation, unspecified: Secondary | ICD-10-CM

## 2014-05-06 DIAGNOSIS — M109 Gout, unspecified: Secondary | ICD-10-CM

## 2014-05-06 DIAGNOSIS — M1711 Unilateral primary osteoarthritis, right knee: Secondary | ICD-10-CM

## 2014-05-06 DIAGNOSIS — M10071 Idiopathic gout, right ankle and foot: Secondary | ICD-10-CM

## 2014-05-06 MED ORDER — ATENOLOL 100 MG PO TABS: ORAL_TABLET | ORAL | Status: DC

## 2014-05-06 MED ORDER — SIMVASTATIN 40 MG PO TABS: ORAL_TABLET | ORAL | Status: DC

## 2014-05-06 MED ORDER — TAMSULOSIN HCL 0.4 MG PO CAPS: ORAL_CAPSULE | ORAL | Status: DC

## 2014-05-06 MED ORDER — ONETOUCH ULTRASOFT LANCETS MISC: Status: DC

## 2014-05-06 MED ORDER — ALLOPURINOL 100 MG PO TABS: 100.0000 mg | ORAL_TABLET | Freq: Every day | ORAL | Status: DC

## 2014-05-06 MED ORDER — COLCHICINE 0.6 MG PO TABS: 0.6000 mg | ORAL_TABLET | Freq: Every day | ORAL | Status: AC

## 2014-05-06 MED ORDER — LISINOPRIL 5 MG PO TABS: 5.0000 mg | ORAL_TABLET | Freq: Every day | ORAL | Status: DC

## 2014-05-06 MED ORDER — FUROSEMIDE 20 MG PO TABS: 20.0000 mg | ORAL_TABLET | Freq: Every day | ORAL | Status: DC

## 2014-05-06 MED ORDER — LINACLOTIDE 145 MCG PO CAPS: 145.0000 ug | ORAL_CAPSULE | Freq: Every day | ORAL | Status: DC

## 2014-05-06 MED ORDER — GLUCOSE BLOOD VI STRP: ORAL_STRIP | Status: DC

## 2014-05-06 NOTE — Progress Notes (Signed)
Patient ID: Stanley Sullivan, male   DOB: 08-Feb-1941, 74 y.o.   MRN: [id number]   Location:  [facility]  No Known Allergies  Chief Complaint  Patient presents with  . Medical Management of Chronic Issues    HPI: Patient is a 74 y.o.  seen in the office today for medical mgt of chronic diseases.    Pain in the knees persists.   Breathing problems--using oxygen.  Dr. Marchelle Gearing has not prescribed medications.   Allergies are good right now.  Still itches all of the time, even when he does not have overt allergy.  Discussed using the allergy medication daily, but he does not like taking meds daily.   Does not want to go back to cardiology. Taking tablet everyday and no gout flare.  With daily beer over Christmas time, had some pain, used colchicine as needed. No constipation--using metamucil with benefit. BP ok today.    Review of Systems:  Review of Systems  Constitutional: Negative for fever and chills.  Respiratory: Positive for shortness of breath.        Hypoxia  Cardiovascular: Negative for chest pain and leg swelling.  Gastrointestinal: Negative for constipation, blood in stool and melena.       Using metamucil with benefit  Genitourinary: Negative for dysuria.  Musculoskeletal: Positive for joint pain. Negative for falls.  Skin: Negative for rash.  Neurological: Negative for dizziness, loss of consciousness and headaches.  Psychiatric/Behavioral: Negative for depression and memory loss.     Past Medical History  Diagnosis Date  . Osteoarthrosis, unspecified whether generalized or localized, unspecified site   . Pain in joint, lower leg   . Chronic kidney disease, stage III (moderate)   . Other specified visual disturbances   . Unspecified disorder of kidney and ureter   . Other abnormal glucose   . Obesity, unspecified   . Urticaria, unspecified   . Allergy, unspecified not elsewhere classified   . Pain in limb   . Gout,  unspecified   . Other and unspecified hyperlipidemia   . External hemorrhoids without mention of complication   . Organic sleep apnea, unspecified   . Unspecified essential hypertension   . Hypertrophy of prostate without urinary obstruction and other lower urinary tract symptoms (LUTS)   . Other atopic dermatitis and related conditions     Past Surgical History  Procedure Laterality Date  . Cataract extraction      Social History:   reports that he quit smoking about 21 years ago. His smoking use included Cigarettes. He has a 7.5 pack-year smoking history. He has quit using smokeless tobacco. His smokeless tobacco use included Chew. He reports that he drinks alcohol. He reports that he does not use illicit drugs.  No family history on file.  Medications: Patient's Medications  New Prescriptions   No medications on file  Previous Medications   ACETAMINOPHEN (TYLENOL) 500 MG TABLET    Take 500 mg by mouth every 6 (six) hours as needed for pain. Take 1-2 tablets by mouth every 6-8 hours as needed for pain.   ALLOPURINOL (ZYLOPRIM) 100 MG TABLET    Take 1 tablet (100 mg total) by mouth daily. For gout   ATENOLOL (TENORMIN) 100 MG TABLET    TAKE 1 TABLET BY MOUTH ONCE DAILY FOR BLOOD PRESSURE   COLCHICINE 0.6 MG TABLET    Take 1 tablet (0.6 mg total) by mouth daily. For gout   FUROSEMIDE (LASIX) 20 MG  TABLET    Take 1 tablet (20 mg total) by mouth daily. For swelling of legs   GLUCOSE BLOOD TEST STRIP    Test glucose daily   LANCETS (FREESTYLE) LANCETS    Use as instructed   LINACLOTIDE (LINZESS) 145 MCG CAPS CAPSULE    Take 1 capsule (145 mcg total) by mouth daily. For constipation   LISINOPRIL (PRINIVIL,ZESTRIL) 5 MG TABLET    Take 1 tablet (5 mg total) by mouth daily. For blood pressure and diabetes   SIMVASTATIN (ZOCOR) 40 MG TABLET    TAKE 1 TABLET BY MOUTH DAILY FOR CHOLESTEROL   TAMSULOSIN (FLOMAX) 0.4 MG CAPS CAPSULE    TAKE 1 CAPSULE BY MOUTH ONCE A DAY for your prostate    Modified Medications   No medications on file  Discontinued Medications   CLOBETASOL OINTMENT (TEMOVATE) 0.05 %    Apply 1 application topically 2 (two) times daily. Apply twice a day on right leg rash.   DEXTROMETHORPHAN-GUAIFENESIN (MUCINEX DM MAXIMUM STRENGTH) 60-1200 MG TB12    Take 1 tablet by mouth every 12 (twelve) hours. Take 1 tablet every 12 hours.   FLUTICASONE (FLONASE) 50 MCG/ACT NASAL SPRAY    Place 1 spray into both nostrils 2 (two) times daily. One spray in each nostril twice daily for allergies   LINAGLIPTIN (TRADJENTA) 5 MG TABS TABLET    Take 1 tablet (5 mg total) by mouth daily. For diabetes   MELOXICAM (MOBIC) 7.5 MG TABLET    Take 1 tablet (7.5 mg total) by mouth daily. For joint pain   MOMETASONE (ELOCON) 0.1 % CREAM    Apply 1 application topically daily. For rash     Physical Exam: Filed Vitals:   05/06/14 1616  BP: 132/72  Pulse: 72  Temp: 98.3 F (36.8 C)  TempSrc: Oral  Resp: 18  Height: 5\' 5"  (1.651 m)  Weight: 178 lb 6.4 oz (80.922 kg)  SpO2: 91%  Physical Exam  Constitutional: He is oriented to person, place, and time. He appears well-developed and well-nourished. No distress.  Cardiovascular: Normal rate, regular rhythm, normal heart sounds and intact distal pulses.   Pulmonary/Chest: Effort normal and breath sounds normal. No respiratory distress. He has no wheezes.  Sats 91 at rest, but often drop into 80s on ambulation  Abdominal: Soft. Bowel sounds are normal.  Musculoskeletal: Normal range of motion. He exhibits tenderness.  Right knee  Neurological: He is alert and oriented to person, place, and time. Coordination normal.  Psychiatric: He has a normal mood and affect.     Labs reviewed: Basic Metabolic Panel:  Recent Labs  16/01/9603/27/15 1101 01/29/14 1153  NA 143 139  K 5.4* 4.7  CL 107 100  CO2 23 25  GLUCOSE 97 92  BUN 17 14  CREATININE 1.43* 1.48*  CALCIUM 8.7 9.4   Liver Function Tests:  Recent Labs  08/24/13 1101  AST  15  ALT 6  ALKPHOS 49  BILITOT 0.8  PROT 5.9*   No results for input(s): LIPASE, AMYLASE in the last 8760 hours. No results for input(s): AMMONIA in the last 8760 hours. CBC:  Recent Labs  08/24/13 1101 01/29/14 1153  WBC 7.7 10.8  NEUTROABS 4.9 7.2*  HGB 14.3 13.9  HCT 43.3 43.3  MCV 73* 72*  PLT 228 225   Lipid Panel:  Recent Labs  08/24/13 1101 01/29/14 1153  HDL 46 48  LDLCALC 137* 119*  TRIG 92 139  CHOLHDL 4.4 4.1   Lab Results  Component Value Date   HGBA1C 6.7* 01/29/2014     Assessment/Plan 1. DM (diabetes mellitus) type II controlled with renal manifestation - f/u labs in am b/c he did not have in advance and needs lipids rechecked -has been exercising, but not using his portable oxygen as ordered by Dr. Marchelle Gearing during activity, only at [facility] portable machine not working right - Adult nurse; Future - Hemoglobin A1c; Future - Lipid panel; Future  2. Chronic kidney disease, stage III (moderate) -avoid nsaids -due to diabetes -f/u bmp  3. Gout of multiple sites, unspecified cause, unspecified chronicity -is taking allopurinol -flared up with some beer over Christmas and had to take the colchicine  4. Essential hypertension, benign -bp reasonable right now--he refuses to take more meds  5. Chronic idiopathic constipation -cont metamucil and linzess  6. Primary osteoarthritis of right knee -advised to use tylenol, voltaren gel, exercise   Labs/tests ordered:  To be done fasting in the morning Orders Placed This Encounter  Procedures  . Basic metabolic panel    Standing Status: Future     Number of Occurrences:      Standing Expiration Date: 08/05/2014    Order Specific Question:  Has the patient fasted?    Answer:  Yes  . Hemoglobin A1c    Standing Status: Future     Number of Occurrences:      Standing Expiration Date: 08/05/2014  . Lipid panel    Standing Status: Future     Number of Occurrences:      Standing  Expiration Date: 08/05/2014    Order Specific Question:  Has the patient fasted?    Answer:  Yes    Next appt:  3 mos;  Fasting labs in am  Mikeyla Music L. Jozalyn Baglio, D.O. Geriatrics [facility] [address], [place] [address] Cell Phone (Mon-Fri 8am-5pm):  [phone] On Call:  [phone] & follow prompts after 5pm & weekends Office Phone:  [phone] Office Fax:  [phone]

## 2014-05-07 ENCOUNTER — Other Ambulatory Visit: Payer: Medicare Other

## 2014-05-07 DIAGNOSIS — E1129 Type 2 diabetes mellitus with other diabetic kidney complication: Secondary | ICD-10-CM

## 2014-05-08 LAB — BASIC METABOLIC PANEL
BUN/Creatinine Ratio: 9 — ABNORMAL LOW (ref 10–22)
BUN: 17 mg/dL (ref 8–27)
CO2: 21 mmol/L (ref 18–29)
Calcium: 9.2 mg/dL (ref 8.6–10.2)
Chloride: 105 mmol/L (ref 97–108)
Creatinine, Ser: 1.88 mg/dL — ABNORMAL HIGH (ref 0.76–1.27)
GFR calc Af Amer: 40 mL/min/{1.73_m2} — ABNORMAL LOW (ref >59)
GFR calc non Af Amer: 34 mL/min/{1.73_m2} — ABNORMAL LOW (ref >59)
Glucose: 105 mg/dL — ABNORMAL HIGH (ref 65–99)
Potassium: 4.8 mmol/L (ref 3.5–5.2)
Sodium: 143 mmol/L (ref 134–144)

## 2014-05-08 LAB — LIPID PANEL
Chol/HDL Ratio: 4.2 ratio units (ref 0.0–5.0)
Cholesterol, Total: 172 mg/dL (ref 100–199)
HDL: 41 mg/dL (ref >39)
LDL Calculated: 103 mg/dL — ABNORMAL HIGH (ref 0–99)
Triglycerides: 142 mg/dL (ref 0–149)
VLDL Cholesterol Cal: 28 mg/dL (ref 5–40)

## 2014-05-08 LAB — HEMOGLOBIN A1C
Est. average glucose Bld gHb Est-mCnc: 143 mg/dL
Hgb A1c MFr Bld: 6.6 % — ABNORMAL HIGH (ref 4.8–5.6)

## 2014-05-10 ENCOUNTER — Telehealth: Payer: Self-pay | Admitting: *Deleted

## 2014-05-10 NOTE — Telephone Encounter (Signed)
-----   Message from Kermit Baloiffany L Reed, DO sent at 05/09/2014 10:38 AM EST ----- Sugar average has improved slightly.  Continue to exercise, but I recommend he use his portable oxygen.   His kidney function has worsened.  He needs to be drinking enough water and avoiding any nsaids by mouth--ibuprofen, aleve, mobic, etc.  These will worsen his kidneys and he's been advised several times before not to use them.  Tylenol (acetaminophen) is ok.

## 2014-05-10 NOTE — Telephone Encounter (Signed)
Spoke with patient's wife regarding lab results, she states that she understood the results and that they would continue to exercise and increase his water. I stressed to the patient's wife that he is to avoid nsaids  Due to worsened kidney function.

## 2014-06-24 ENCOUNTER — Other Ambulatory Visit: Payer: Self-pay | Admitting: Internal Medicine

## 2014-07-02 ENCOUNTER — Ambulatory Visit (INDEPENDENT_AMBULATORY_CARE_PROVIDER_SITE_OTHER): Payer: Medicare Other | Admitting: Internal Medicine

## 2014-07-02 ENCOUNTER — Encounter: Payer: Self-pay | Admitting: Internal Medicine

## 2014-07-02 VITALS — BP 130/90 | HR 57 | Resp 18 | Ht 65.0 in | Wt 174.6 lb

## 2014-07-02 DIAGNOSIS — R001 Bradycardia, unspecified: Secondary | ICD-10-CM

## 2014-07-02 DIAGNOSIS — I10 Essential (primary) hypertension: Secondary | ICD-10-CM

## 2014-07-02 MED ORDER — ATENOLOL 100 MG PO TABS: ORAL_TABLET | ORAL | Status: DC

## 2014-07-02 MED ORDER — LISINOPRIL 5 MG PO TABS: 5.0000 mg | ORAL_TABLET | Freq: Every day | ORAL | Status: DC

## 2014-07-02 MED ORDER — FUROSEMIDE 20 MG PO TABS: 20.0000 mg | ORAL_TABLET | Freq: Every day | ORAL | Status: DC

## 2014-07-02 NOTE — Progress Notes (Signed)
Patient ID: Stanley Sullivan, male   DOB: 02-27-1941, 74 y.o.   MRN: [id number]    Facility  [facility]    Place of Service:   OFFICE   No Known Allergies  Chief Complaint  Patient presents with  . Acute Visit    BP is up and down per patient's wife; hx HTN    HPI:  74 yo male seen today for labile BP. He is taking atenolol, lasix and lisinopril. Wife reports BP fluctuating and she gives him an extra pill. This AM BP 165/80 but he has not taken med yet. After waiting a few hrs after taking med, BP still up at 160s SBP. Last night, SBP 199. Uses an automatic arm cuff for BPs. Occasioanlly he has head heaviness sensation but no HA, dizziness, CP, SOB, palpitations. He notes leg swelling "some times". Wife gives him an extra dose of atenolol when BP really high. He has not taken lisinopril in several months.  Past Medical History  Diagnosis Date  . Osteoarthrosis, unspecified whether generalized or localized, unspecified site   . Pain in joint, lower leg   . Chronic kidney disease, stage III (moderate)   . Other specified visual disturbances   . Unspecified disorder of kidney and ureter   . Other abnormal glucose   . Obesity, unspecified   . Urticaria, unspecified   . Allergy, unspecified not elsewhere classified   . Pain in limb   . Gout, unspecified   . Other and unspecified hyperlipidemia   . External hemorrhoids without mention of complication   . Organic sleep apnea, unspecified   . Unspecified essential hypertension   . Hypertrophy of prostate without urinary obstruction and other lower urinary tract symptoms (LUTS)   . Other atopic dermatitis and related conditions    History   Social History  . Marital Status: Married    Spouse Name: N/A  . Number of Children: N/A  . Years of Education: N/A   Occupational History  . retired    Social History Main Topics  . Smoking status: Former Smoker -- 0.50 packs/day for 15 years    Types: Cigarettes    Quit date: 04/30/1993  .  Smokeless tobacco: Former NeurosurgeonUser    Types: Chew  . Alcohol Use: Yes     Comment: occassional  . Drug Use: No  . Sexual Activity: Not on file   Other Topics Concern  . Not on file   Social History Narrative    Medications: Patient's Medications  New Prescriptions   No medications on file  Previous Medications   ACETAMINOPHEN (TYLENOL) 500 MG TABLET    Take 500 mg by mouth every 6 (six) hours as needed for pain. Take 1-2 tablets by mouth every 6-8 hours as needed for pain.   ALLOPURINOL (ZYLOPRIM) 100 MG TABLET    Take 1 tablet (100 mg total) by mouth daily. For gout   ATENOLOL (TENORMIN) 100 MG TABLET    TAKE 1 TABLET BY MOUTH ONCE DAILY FOR BLOOD PRESSURE   COLCHICINE 0.6 MG TABLET    Take 1 tablet (0.6 mg total) by mouth daily. For gout   FUROSEMIDE (LASIX) 20 MG TABLET    Take 1 tablet (20 mg total) by mouth daily. For swelling of legs   GLUCOSE BLOOD TEST STRIP    Use as instructed   LANCETS (FREESTYLE) LANCETS    Use as instructed   LANCETS (ONETOUCH ULTRASOFT) LANCETS    Use as instructed   LINACLOTIDE (LINZESS) 145  MCG CAPS CAPSULE    Take 1 capsule (145 mcg total) by mouth daily. For constipation   LISINOPRIL (PRINIVIL,ZESTRIL) 5 MG TABLET    Take 1 tablet (5 mg total) by mouth daily. For blood pressure and diabetes   SIMVASTATIN (ZOCOR) 40 MG TABLET    TAKE 1 TABLET BY MOUTH DAILY FOR CHOLESTEROL   TAMSULOSIN (FLOMAX) 0.4 MG CAPS CAPSULE    TAKE 1 CAPSULE BY MOUTH ONCE A DAY for your prostate  Modified Medications   No medications on file  Discontinued Medications   GLUCOSE BLOOD TEST STRIP    Test glucose daily     Review of Systems  Constitutional: Positive for fatigue. Negative for chills and activity change.  HENT: Negative for sore throat and trouble swallowing.   Eyes: Negative for visual disturbance.  Respiratory: Positive for shortness of breath (uses [place] O2 at night; worse with stair climbing). Negative for cough and chest tightness.   Cardiovascular: Positive  for leg swelling. Negative for chest pain and palpitations.  Gastrointestinal: Negative for nausea, vomiting, abdominal pain and blood in stool.  Genitourinary: Positive for frequency (due to prostate). Negative for urgency and difficulty urinating.  Musculoskeletal: Negative for arthralgias and gait problem.  Skin: Negative for rash.  Neurological: Negative for dizziness, weakness and headaches.  Psychiatric/Behavioral: Negative for confusion and sleep disturbance. The patient is not nervous/anxious.     Filed Vitals:   07/02/14 0917  BP: 130/90  Repeat BP 140/84  Pulse: 57  Resp: 18  Height:  (1.651 m)  Weight: 174 lb 9.6 oz (79.198 kg)  SpO2: 95%   Body mass index is 29.05 kg/(m^2).  Physical Exam  Constitutional: He is oriented to person, place, and time. He appears well-developed and well-nourished. No distress.  Wife present  HENT:  Mouth/Throat: Oropharynx is clear and moist.  Eyes: Pupils are equal, round, and reactive to light. No scleral icterus.  Neck: Neck supple. No thyromegaly present.  Cardiovascular: Regular rhythm and intact distal pulses.  Bradycardia present.  Exam reveals no gallop and no friction rub.   Murmur (1/6 SEM) heard. HR 52 bpm; No carotid bruit b/l; no distal LE swelling  Pulmonary/Chest: Effort normal and breath sounds normal. He has no wheezes. He has no rales. He exhibits no tenderness.  Abdominal: Soft. Bowel sounds are normal. He exhibits no distension, no abdominal bruit, no pulsatile midline mass and no mass. There is no tenderness. There is no rebound and no guarding.  Lymphadenopathy:    He has no cervical adenopathy.  Neurological: He is alert and oriented to person, place, and time. He has normal reflexes.  Skin: Skin is warm and dry. No rash noted.  Psychiatric: He has a normal mood and affect. His behavior is normal. Judgment and thought content normal.     Labs reviewed: Appointment on 05/07/2014  Component Date Value Ref  Range Status  . Glucose 05/07/2014 105* 65 - 99 mg/dL Final   Comment: Specimen received in contact with cells. No visible hemolysis present. However GLUC may be decreased and K increased. Clinical correlation indicated.   . BUN 05/07/2014 17  8 - 27 mg/dL Final  . Creatinine, Ser 05/07/2014 1.88* 0.76 - 1.27 mg/dL Final  . GFR calc non Af Amer 05/07/2014 34* >59 mL/min/1.73 Final  . GFR calc Af Amer 05/07/2014 40* >59 mL/min/1.73 Final  . BUN/Creatinine Ratio 05/07/2014 9* 10 - 22 Final  . Sodium 05/07/2014 143  134 - 144 mmol/L Final  . Potassium 05/07/2014  4.8  3.5 - 5.2 mmol/L Final  . Chloride 05/07/2014 105  97 - 108 mmol/L Final  . CO2 05/07/2014 21  18 - 29 mmol/L Final  . Calcium 05/07/2014 9.2  8.6 - 10.2 mg/dL Final  . Hgb Z6X MFr Bld 05/07/2014 6.6* 4.8 - 5.6 % Final   Comment:          Pre-diabetes: 5.7 - 6.4          Diabetes: >6.4          Glycemic control for adults with diabetes: <7.0   . Est. average glucose Bld gHb Est-m* 05/07/2014 143   Final  . Cholesterol, Total 05/07/2014 172  100 - 199 mg/dL Final  . Triglycerides 05/07/2014 142  0 - 149 mg/dL Final  . HDL 09/60/4540 41  >39 mg/dL Final   Comment: According to ATP-III Guidelines, HDL-C >59 mg/dL is considered a negative risk factor for CHD.   Marland Kitchen VLDL Cholesterol Cal 05/07/2014 28  5 - 40 mg/dL Final  . LDL Calculated 05/07/2014 981* 0 - 99 mg/dL Final  . Chol/HDL Ratio 05/07/2014 4.2  0.0 - 5.0 ratio units Final   Comment:                                   T. Chol/HDL Ratio                                             Men  Women                               1/2 Avg.Risk  3.4    3.3                                   Avg.Risk  5.0    4.4                                2X Avg.Risk  9.6    7.1                                3X Avg.Risk 23.4   11.0       Assessment/Plan   ICD-9-CM ICD-10-CM   1. Essential hypertension, benign - uncontrolled with labile BP 401.1 I10 atenolol (TENORMIN) 100 MG tablet      furosemide (LASIX) 20 MG tablet  2. 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 Resume lisinopril TODAY. Take med 1 tab BID today only then start 1 tab qPM tomorrow. HOLD atenolol today only due to bradycardia. May resume in AM. May exercise today but avoid using sauna/steam room.   --keep appt with Dr Renato Gails as scheduled next month  --med RF lisinopril, atenolol and lasix sent today  --bring BP diary and cuff to next OV to compare readings  --call if BP >150/90. Go to the ER if CP, SOB, palpitations occur.  Chloris Marcoux S. Montez Morita, D. O., F. A. C.  [facility] [address] [place], [place] [address] [phone] Office (Wednesdays and Fridays 8 AM - 5 PM) [phone] Cell (Monday-Friday 8 AM - 5 PM)

## 2014-07-02 NOTE — Patient Instructions (Signed)
DO NOT TAKE ATENOLOL TODAY due to low heart rate. May restart medicine tomorrow.  Start lisinopril today. Take 1 dose now and again this evening. Call office if blood pressure remains < 150/90.  May exercise today but avoid sauna/steam room today.  Normal heart rate 60 - 100. Need to keep blood pressure <140/80.  Take medication at the same time every day.   Bring blood pressure cuff to next office visit so that we can check to see if it is working properly.  Low salt diet.  Follow up with Dr Renato Gailseed as scheduled.

## 2014-08-09 ENCOUNTER — Encounter: Payer: Self-pay | Admitting: Internal Medicine

## 2014-08-09 ENCOUNTER — Ambulatory Visit (INDEPENDENT_AMBULATORY_CARE_PROVIDER_SITE_OTHER): Payer: Medicare Other | Admitting: Internal Medicine

## 2014-08-09 VITALS — BP 168/90 | HR 61 | Temp 97.6°F | Resp 20 | Ht 65.0 in | Wt 174.4 lb

## 2014-08-09 DIAGNOSIS — J849 Interstitial pulmonary disease, unspecified: Secondary | ICD-10-CM

## 2014-08-09 DIAGNOSIS — N058 Unspecified nephritic syndrome with other morphologic changes: Secondary | ICD-10-CM

## 2014-08-09 DIAGNOSIS — E1129 Type 2 diabetes mellitus with other diabetic kidney complication: Secondary | ICD-10-CM | POA: Diagnosis not present

## 2014-08-09 DIAGNOSIS — M109 Gout, unspecified: Secondary | ICD-10-CM | POA: Diagnosis not present

## 2014-08-09 DIAGNOSIS — N138 Other obstructive and reflux uropathy: Secondary | ICD-10-CM

## 2014-08-09 DIAGNOSIS — N401 Enlarged prostate with lower urinary tract symptoms: Secondary | ICD-10-CM | POA: Diagnosis not present

## 2014-08-09 DIAGNOSIS — I10 Essential (primary) hypertension: Secondary | ICD-10-CM | POA: Diagnosis not present

## 2014-08-09 DIAGNOSIS — N183 Chronic kidney disease, stage 3 unspecified: Secondary | ICD-10-CM

## 2014-08-09 NOTE — Progress Notes (Signed)
Patient ID: Stanley Sullivan, male   DOB: [birth year], 74 y.o.   MRN: [id number]   Location:  [facility]  Code Status: full code Goals of Care: Advanced Directive information Does patient have an advance directive?: No, Would patient like information on creating an advanced directive?: No - patient declined information   No Known Allergies  Chief Complaint  Patient presents with  . Medical Management of Chronic Issues    BP is extremely high, just taken medication    HPI: Patient is a 74 y.o. [place] male seen in the office today for med mgt of his chronic diseases.    Had homemade tortillas this am with lots of sodium.  Also had three drinks last night at party--also salty food at party.  Had his regular bp medication this am.   BP was very high, did come down after sitting.     He was also notably hypoxic to 84% after walking in here, but improved to 92% with rest.  Will not use oxygen except at home at night.  Denies shortness of breath.  If he's walking in yard--gets very hoarse, but his wife has given up on getting him to take the allergy pill daily.    Some bilateral groin pain.  Is sometimes swollen.  Possibly correlated with constipation--advised to observe.  Difficulty starting stream--even with medicine.  No urgency or incontinence.  If drinks a lot of water, has to get up several times, but on average, is 2x.  Previously saw urology.  Knee pain ok these days with tylenol sometimes.    Is faithfully taking cholesterol and bp meds.    Review of Systems:  Review of Systems  Constitutional: Negative for fever, chills and malaise/fatigue.  HENT: Negative for hearing loss.   Eyes: Negative for blurred vision.  Respiratory: Positive for cough and shortness of breath.   Cardiovascular: Negative for chest pain and leg swelling.  Gastrointestinal: Negative for abdominal pain, constipation, blood in stool and melena.  Genitourinary:  Positive for frequency. Negative for dysuria and urgency.       Difficulty starting stream  Musculoskeletal: Positive for joint pain. Negative for myalgias and falls.  Skin: Negative for itching and rash.  Neurological: Negative for dizziness, loss of consciousness, weakness and headaches.  Endo/Heme/Allergies: Does not bruise/bleed easily.  Psychiatric/Behavioral: Negative for depression and memory loss.     Past Medical History  Diagnosis Date  . Osteoarthrosis, unspecified whether generalized or localized, unspecified site   . Pain in joint, lower leg   . Chronic kidney disease, stage III (moderate)   . Other specified visual disturbances   . Unspecified disorder of kidney and ureter   . Other abnormal glucose   . Obesity, unspecified   . Urticaria, unspecified   . Allergy, unspecified not elsewhere classified   . Pain in limb   . Gout, unspecified   . Other and unspecified hyperlipidemia   . External hemorrhoids without mention of complication   . Organic sleep apnea, unspecified   . Unspecified essential hypertension   . Hypertrophy of prostate without urinary obstruction and other lower urinary tract symptoms (LUTS)   . Other atopic dermatitis and related conditions     Past Surgical History  Procedure Laterality Date  . Cataract extraction      Social History:   reports that he quit smoking about 21 years ago. His smoking use included Cigarettes. He has a 7.5 pack-year smoking history. He has quit  using smokeless tobacco. His smokeless tobacco use included Chew. He reports that he drinks alcohol. He reports that he does not use illicit drugs.  No family history on file.  Medications: Patient's Medications  New Prescriptions   No medications on file  Previous Medications   ACETAMINOPHEN (TYLENOL) 500 MG TABLET    Take 500 mg by mouth every 6 (six) hours as needed for pain. Take 1-2 tablets by mouth every 6-8 hours as needed for pain.   ALLOPURINOL (ZYLOPRIM) 100 MG  TABLET    Take 1 tablet (100 mg total) by mouth daily. For gout   ATENOLOL (TENORMIN) 100 MG TABLET    TAKE 1 TABLET BY MOUTH ONCE DAILY FOR BLOOD PRESSURE   COLCHICINE 0.6 MG TABLET    Take 1 tablet (0.6 mg total) by mouth daily. For gout   FUROSEMIDE (LASIX) 20 MG TABLET    Take 1 tablet (20 mg total) by mouth daily. For swelling of legs   GLUCOSE BLOOD TEST STRIP    Use as instructed   LANCETS (FREESTYLE) LANCETS    Use as instructed   LANCETS (ONETOUCH ULTRASOFT) LANCETS    Use as instructed   LINACLOTIDE (LINZESS) 145 MCG CAPS CAPSULE    Take 1 capsule (145 mcg total) by mouth daily. For constipation   LISINOPRIL (PRINIVIL,ZESTRIL) 5 MG TABLET    Take 1 tablet (5 mg total) by mouth daily. For blood pressure and diabetes   SIMVASTATIN (ZOCOR) 40 MG TABLET    TAKE 1 TABLET BY MOUTH DAILY FOR CHOLESTEROL   TAMSULOSIN (FLOMAX) 0.4 MG CAPS CAPSULE    TAKE 1 CAPSULE BY MOUTH ONCE A DAY for your prostate  Modified Medications   No medications on file  Discontinued Medications   No medications on file     Physical Exam: Filed Vitals:   08/09/14 1049  BP: 190/110  Pulse: 61  Temp: 97.6 F (36.4 C)  TempSrc: Oral  Resp: 20  Height: 5\' 5"  (1.651 m)  Weight: 174 lb 6.4 oz (79.107 kg)  SpO2: 84%  Physical Exam  Constitutional: He is oriented to person, place, and time. He appears well-developed and well-nourished. No distress.  Neck: Neck supple.  Left greater than right  Cardiovascular: Normal rate, regular rhythm, normal heart sounds and intact distal pulses.   Pulmonary/Chest: Effort normal and breath sounds normal.  Abdominal: Soft. Bowel sounds are normal. He exhibits no distension and no mass. There is no tenderness.  Musculoskeletal: Normal range of motion. He exhibits tenderness. He exhibits no edema.  Bilateral knees  Lymphadenopathy:    He has cervical adenopathy.  Neurological: He is alert and oriented to person, place, and time.  Skin: Skin is warm and dry.    Psychiatric: He has a normal mood and affect.    Labs reviewed: Basic Metabolic Panel:  Recent Labs  16/01/9603/27/15 1101 01/29/14 1153 05/07/14 1029  NA 143 139 143  K 5.4* 4.7 4.8  CL 107 100 105  CO2 23 25 21   GLUCOSE 97 92 105*  BUN 17 14 17   CREATININE 1.43* 1.48* 1.88*  CALCIUM 8.7 9.4 9.2   Liver Function Tests:  Recent Labs  08/24/13 1101  AST 15  ALT 6  ALKPHOS 49  BILITOT 0.8  PROT 5.9*   No results for input(s): LIPASE, AMYLASE in the last 8760 hours. No results for input(s): AMMONIA in the last 8760 hours. CBC:  Recent Labs  08/24/13 1101 01/29/14 1153  WBC 7.7 10.8  NEUTROABS 4.9 7.2*  HGB 14.3 13.9  HCT 43.3 43.3  MCV 73* 72*  PLT 228 225   Lipid Panel:  Recent Labs  08/24/13 1101 01/29/14 1153 05/07/14 1029  CHOL 201* 195 172  HDL 46 48 41  LDLCALC 137* 119* 103*  TRIG 92 139 142  CHOLHDL 4.4 4.1 4.2   Lab Results  Component Value Date   HGBA1C 6.6* 05/07/2014   Assessment/Plan 1. Chronic kidney disease, stage III (moderate) - cont to avoid nsaids, other nephrotoxic agents -needs to follow low sodium low carb diet and exercise, but is noncompliant - CBC with Differential/Platelet - Comprehensive metabolic panel  2. Essential hypertension, benign -bp very high, still elevated after recheck, but did come down from urgency level - Comprehensive metabolic panel  3. DM (diabetes mellitus) type II controlled with renal manifestation -f/u labs -noncompliant with diet and exercise - TSH - CBC with Differential/Platelet - Hemoglobin A1c - Lipid panel  4. Gout of multiple sites, unspecified cause, unspecified chronicity -does not faithfully take gout medications despite advice  5. Interstitial lung disease F/u with Dr. Marchelle Gearing to discuss biopsy--he does not want--?change management of his ILD if we had result -he is afraid -is not using oxygen when he should be -came in here with pox 84% after ambulating and very  hypertensive -explained importance of oxygen again   6. BPH with obstruction/lower urinary tract symptoms - PSA -If psa higher, will plan to refer back to urologist due to difficulty starting stream even with regular flomax use  Labs/tests ordered: Orders Placed This Encounter  Procedures  . PSA  . TSH  . CBC with Differential/Platelet  . Comprehensive metabolic panel    Order Specific Question:  Has the patient fasted?    Answer:  Yes  . Hemoglobin A1c  . Lipid panel    Order Specific Question:  Has the patient fasted?    Answer:  Yes   Next appt:  3 mos  Shantell Belongia L. Camaryn Lumbert, D.O. Geriatrics [facility] [address], [place] [address] Cell Phone (Mon-Fri 8am-5pm):  [phone] On Call:  [phone] & follow prompts after 5pm & weekends Office Phone:  [phone] Office Fax:  [phone]

## 2014-08-10 LAB — COMPREHENSIVE METABOLIC PANEL
ALT: 7 IU/L (ref 0–44)
AST: 17 IU/L (ref 0–40)
Albumin/Globulin Ratio: 1.5 (ref 1.1–2.5)
Albumin: 3.8 g/dL (ref 3.5–4.8)
Alkaline Phosphatase: 65 IU/L (ref 39–117)
BUN/Creatinine Ratio: 13 (ref 10–22)
BUN: 21 mg/dL (ref 8–27)
Bilirubin Total: 0.7 mg/dL (ref 0.0–1.2)
CO2: 23 mmol/L (ref 18–29)
Calcium: 9 mg/dL (ref 8.6–10.2)
Chloride: 106 mmol/L (ref 97–108)
Creatinine, Ser: 1.67 mg/dL — ABNORMAL HIGH (ref 0.76–1.27)
GFR calc Af Amer: 46 mL/min/{1.73_m2} — ABNORMAL LOW (ref >59)
GFR calc non Af Amer: 39 mL/min/{1.73_m2} — ABNORMAL LOW (ref >59)
Globulin, Total: 2.5 g/dL (ref 1.5–4.5)
Glucose: 79 mg/dL (ref 65–99)
Potassium: 4.7 mmol/L (ref 3.5–5.2)
Sodium: 143 mmol/L (ref 134–144)
Total Protein: 6.3 g/dL (ref 6.0–8.5)

## 2014-08-10 LAB — LIPID PANEL
Chol/HDL Ratio: 4 ratio units (ref 0.0–5.0)
Cholesterol, Total: 172 mg/dL (ref 100–199)
HDL: 43 mg/dL (ref >39)
LDL Calculated: 107 mg/dL — ABNORMAL HIGH (ref 0–99)
Triglycerides: 110 mg/dL (ref 0–149)
VLDL Cholesterol Cal: 22 mg/dL (ref 5–40)

## 2014-08-10 LAB — CBC WITH DIFFERENTIAL/PLATELET
Basophils Absolute: 0 10*3/uL (ref 0.0–0.2)
Basos: 0 %
Eos: 6 %
Eosinophils Absolute: 0.5 10*3/uL — ABNORMAL HIGH (ref 0.0–0.4)
HCT: 47 % (ref 37.5–51.0)
Hemoglobin: 15.1 g/dL (ref 12.6–17.7)
Immature Grans (Abs): 0 10*3/uL (ref 0.0–0.1)
Immature Granulocytes: 0 %
Lymphocytes Absolute: 1.9 10*3/uL (ref 0.7–3.1)
Lymphs: 23 %
MCH: 23.7 pg — ABNORMAL LOW (ref 26.6–33.0)
MCHC: 32.1 g/dL (ref 31.5–35.7)
MCV: 74 fL — ABNORMAL LOW (ref 79–97)
Monocytes Absolute: 1 10*3/uL — ABNORMAL HIGH (ref 0.1–0.9)
Monocytes: 11 %
Neutrophils Absolute: 5 10*3/uL (ref 1.4–7.0)
Neutrophils Relative %: 60 %
Platelets: 195 10*3/uL (ref 150–379)
RBC: 6.38 x10E6/uL — ABNORMAL HIGH (ref 4.14–5.80)
RDW: 16.2 % — ABNORMAL HIGH (ref 12.3–15.4)
WBC: 8.5 10*3/uL (ref 3.4–10.8)

## 2014-08-10 LAB — PSA: PSA: 3.2 ng/mL (ref 0.0–4.0)

## 2014-08-10 LAB — HEMOGLOBIN A1C
Est. average glucose Bld gHb Est-mCnc: 157 mg/dL
Hgb A1c MFr Bld: 7.1 % — ABNORMAL HIGH (ref 4.8–5.6)

## 2014-08-10 LAB — TSH: TSH: 2.18 u[IU]/mL (ref 0.450–4.500)

## 2014-08-19 ENCOUNTER — Other Ambulatory Visit: Payer: Self-pay | Admitting: *Deleted

## 2014-08-19 MED ORDER — SIMVASTATIN 80 MG PO TABS: 80.0000 mg | ORAL_TABLET | Freq: Every day | ORAL | Status: DC

## 2014-08-19 MED ORDER — LINAGLIPTIN 5 MG PO TABS: ORAL_TABLET | ORAL | Status: DC

## 2014-09-14 LAB — HM DIABETES EYE EXAM

## 2014-09-15 ENCOUNTER — Other Ambulatory Visit: Payer: Self-pay

## 2014-09-15 DIAGNOSIS — N400 Enlarged prostate without lower urinary tract symptoms: Secondary | ICD-10-CM

## 2014-09-15 MED ORDER — TAMSULOSIN HCL 0.4 MG PO CAPS: ORAL_CAPSULE | ORAL | Status: DC

## 2014-10-04 ENCOUNTER — Other Ambulatory Visit: Payer: Self-pay | Admitting: *Deleted

## 2014-10-04 DIAGNOSIS — I10 Essential (primary) hypertension: Secondary | ICD-10-CM

## 2014-10-04 MED ORDER — FUROSEMIDE 20 MG PO TABS: 20.0000 mg | ORAL_TABLET | Freq: Every day | ORAL | Status: DC

## 2014-10-04 NOTE — Telephone Encounter (Signed)
Patient requested to be faxed to [facility]. Faxed.

## 2014-10-06 ENCOUNTER — Other Ambulatory Visit: Payer: Self-pay | Admitting: *Deleted

## 2014-10-06 DIAGNOSIS — I10 Essential (primary) hypertension: Secondary | ICD-10-CM

## 2014-10-06 MED ORDER — LISINOPRIL 5 MG PO TABS: 5.0000 mg | ORAL_TABLET | Freq: Every day | ORAL | Status: DC

## 2014-10-08 DIAGNOSIS — J8410 Pulmonary fibrosis, unspecified: Secondary | ICD-10-CM | POA: Diagnosis not present

## 2014-10-21 ENCOUNTER — Ambulatory Visit: Payer: Medicare Other | Admitting: Internal Medicine

## 2014-10-29 ENCOUNTER — Encounter: Payer: Self-pay | Admitting: Internal Medicine

## 2014-10-29 ENCOUNTER — Ambulatory Visit (INDEPENDENT_AMBULATORY_CARE_PROVIDER_SITE_OTHER): Payer: Medicare Other | Admitting: Internal Medicine

## 2014-10-29 VITALS — BP 140/82 | HR 52 | Temp 97.4°F | Resp 18 | Ht 65.0 in | Wt 174.0 lb

## 2014-10-29 DIAGNOSIS — E1129 Type 2 diabetes mellitus with other diabetic kidney complication: Secondary | ICD-10-CM | POA: Insufficient documentation

## 2014-10-29 DIAGNOSIS — K5904 Chronic idiopathic constipation: Secondary | ICD-10-CM

## 2014-10-29 DIAGNOSIS — K5900 Constipation, unspecified: Secondary | ICD-10-CM | POA: Diagnosis not present

## 2014-10-29 DIAGNOSIS — M1711 Unilateral primary osteoarthritis, right knee: Secondary | ICD-10-CM

## 2014-10-29 DIAGNOSIS — M109 Gout, unspecified: Secondary | ICD-10-CM

## 2014-10-29 DIAGNOSIS — J849 Interstitial pulmonary disease, unspecified: Secondary | ICD-10-CM | POA: Diagnosis not present

## 2014-10-29 DIAGNOSIS — N058 Unspecified nephritic syndrome with other morphologic changes: Secondary | ICD-10-CM

## 2014-10-29 DIAGNOSIS — I10 Essential (primary) hypertension: Secondary | ICD-10-CM | POA: Diagnosis not present

## 2014-10-29 HISTORY — DX: Interstitial pulmonary disease, unspecified: J84.9

## 2014-10-29 HISTORY — DX: Unilateral primary osteoarthritis, right knee: M17.11

## 2014-10-29 NOTE — Patient Instructions (Signed)
Please use tylenol 500mg  at least once per day before walking Avoid using advil due to bad effects on kidneys Please wear your oxygen and follow up with Dr. Marchelle Gearingamaswamy about your lung disease and to get humidity added to your oxygen

## 2014-10-29 NOTE — Progress Notes (Signed)
Patient ID: Stanley Sullivan, male   DOB: [birth year], 74 y.o.   MRN: [id number]   Location:  [facility]  Code Status: full code Goals of Care: Advanced Directive information Does patient have an advance directive?: No, Would patient like information on creating an advanced directive?: Yes - Educational materials given   Chief Complaint  Patient presents with  . Acute Visit    dry cough  x 15-20 days, bilateral knee pain but more in the right when he walks    HPI: Patient is a 74 y.o. [place] male seen in the office today for a couple of acute concerns and we wound up reviewing his medical conditions.    Has dry cough all of the time--has interstitial lung disease and is supposed to be regularly following with pulmonary.  It is worse in the past 15-20 days.  The same all of the time.  Sometimes tears up from coughing.  Is using his oxygen ("forgot" this am).  Does not have humidity on his oxygen he says and his mouth, nose and throat stay dry.    Knees have been getting worse.  Has been walking some, but b/c of pain, he doesn't much.  He is taking the allopurinol medication.  Did have a gout flare up in foot.  He has not been taking tylenol --they did not realize it was otc.  Reviewed again not to take advil due to kidneys.  Not taking the tradjenta.  Too expensive.  Glucose in am is 102-103.  Always under 105 so his wife thinks he does not have diabetes.  Again educated them that his glucose must be higher throughout the day b/c of what his sugar averages have been over the past few years.  He loves sweets.  Has no diabetes in family and he is proud of that.  Not walking much due to knee pain.  Review of Systems:  Review of Systems  Constitutional: Negative for fever and chills.  HENT: Negative for congestion.   Respiratory: Positive for cough and shortness of breath.        Chronic, dry  Cardiovascular: Negative for chest pain, palpitations  and leg swelling.  Gastrointestinal: Negative for abdominal pain, constipation, blood in stool and melena.       Controlled with metamucil  Genitourinary: Negative for dysuria and urgency.  Musculoskeletal: Positive for joint pain. Negative for falls.  Skin: Negative for rash.  Neurological: Negative for dizziness and loss of consciousness.  Psychiatric/Behavioral: Negative for memory loss.    Past Medical History  Diagnosis Date  . Osteoarthrosis, unspecified whether generalized or localized, unspecified site   . Pain in joint, lower leg   . Chronic kidney disease, stage III (moderate)   . Other specified visual disturbances   . Unspecified disorder of kidney and ureter   . Other abnormal glucose   . Obesity, unspecified   . Urticaria, unspecified   . Allergy, unspecified not elsewhere classified   . Pain in limb   . Gout, unspecified   . Other and unspecified hyperlipidemia   . External hemorrhoids without mention of complication   . Organic sleep apnea, unspecified   . Unspecified essential hypertension   . Hypertrophy of prostate without urinary obstruction and other lower urinary tract symptoms (LUTS)   . Other atopic dermatitis and related conditions     Past Surgical History  Procedure Laterality Date  . Cataract extraction      No Known Allergies  Medications: Patient's Medications  New Prescriptions   No medications on file  Previous Medications   ACETAMINOPHEN (TYLENOL) 500 MG TABLET    Take 500 mg by mouth every 6 (six) hours as needed for pain. Take 1-2 tablets by mouth every 6-8 hours as needed for pain.   ALLOPURINOL (ZYLOPRIM) 100 MG TABLET    Take 1 tablet (100 mg total) by mouth daily. For gout   ATENOLOL (TENORMIN) 100 MG TABLET    TAKE 1 TABLET BY MOUTH ONCE DAILY FOR BLOOD PRESSURE   COLCHICINE 0.6 MG TABLET    Take 1 tablet (0.6 mg total) by mouth daily. For gout   FUROSEMIDE (LASIX) 20 MG TABLET    Take 1 tablet (20 mg total) by mouth daily. For  swelling of legs   GLUCOSE BLOOD TEST STRIP    Use as instructed   LANCETS (ONETOUCH ULTRASOFT) LANCETS    Use as instructed   LISINOPRIL (PRINIVIL,ZESTRIL) 5 MG TABLET    Take 1 tablet (5 mg total) by mouth daily. For blood pressure and diabetes   SIMVASTATIN (ZOCOR) 80 MG TABLET    Take 1 tablet (80 mg total) by mouth daily.   TAMSULOSIN (FLOMAX) 0.4 MG CAPS CAPSULE    TAKE 1 CAPSULE BY MOUTH ONCE A DAY for your prostate  Modified Medications   No medications on file  Discontinued Medications   LANCETS (FREESTYLE) LANCETS    Use as instructed   LINACLOTIDE (LINZESS) 145 MCG CAPS CAPSULE    Take 1 capsule (145 mcg total) by mouth daily. For constipation   LINAGLIPTIN (TRADJENTA) 5 MG TABS TABLET    Take tradjenta 5mg  daily with his largest meal to lower his sugar.    Physical Exam: Filed Vitals:   10/29/14 0822 10/29/14 0903  BP: 140/82 140/82  Pulse: 52   Temp: 97.4 F (36.3 C)   TempSrc: Oral   Resp: 18   Height: 5\' 5"  (1.651 m)   Weight: 174 lb (78.926 kg)   SpO2: 94%    Physical Exam  Constitutional: He appears well-developed and well-nourished. No distress.  Cardiovascular: Normal rate, regular rhythm, normal heart sounds and intact distal pulses.   Pulmonary/Chest: Effort normal.  Dry coarse expiratory wheezes; dry cough  Abdominal: Bowel sounds are normal.  Musculoskeletal: Normal range of motion. He exhibits tenderness.  Left medial knee and up into quadriceps muscles medially; crepitus of bilateral knees  Neurological: He is alert.  Skin: Skin is warm and dry.    Labs reviewed: Basic Metabolic Panel:  Recent Labs  16/10/96 1153 05/07/14 1029 08/09/14 1139  NA 139 143 143  K 4.7 4.8 4.7  CL 100 105 106  CO2 25 21 23   GLUCOSE 92 105* 79  BUN 14 17 21   CREATININE 1.48* 1.88* 1.67*  CALCIUM 9.4 9.2 9.0  TSH  --   --  2.180   Liver Function Tests:  Recent Labs  08/09/14 1139  AST 17  ALT 7  ALKPHOS 65  BILITOT 0.7  PROT 6.3   No results for  input(s): LIPASE, AMYLASE in the last 8760 hours. No results for input(s): AMMONIA in the last 8760 hours. CBC:  Recent Labs  01/29/14 1153 08/09/14 1139  WBC 10.8 8.5  NEUTROABS 7.2* 5.0  HGB 13.9 15.1  HCT 43.3 47.0  MCV 72* 74*  PLT 225 195   Lipid Panel:  Recent Labs  01/29/14 1153 05/07/14 1029 08/09/14 1139  CHOL 195 172 172  HDL 48 41 43  LDLCALC 119* 103* 107*  TRIG 139 142 110  CHOLHDL 4.1 4.2 4.0   Lab Results  Component Value Date   HGBA1C 7.1* 08/09/2014    Procedures since last visit: 01/08/13:  xrays right knee:  Mild degenerative change for age w/o progression.  Minimal bony spurring.  No effusion. xrays left knee:  Minimal degenerative change and possible small joint effusion, minimal degenerative spurring at patellofemoral articulation.  10/12/13:  CT chest high resolution:  1. No findings to suggest an underlying interstitial lung disease at this time. 2. Multiple small pulmonary nodules noted in the right lung, the most conspicuous of which is a mixed solid and subsolid nodule in the right middle lobe, which has a ground-glass attenuation component measuring 1.3 x 1.2 cm, and a tiny 4 mm central solid component. Initial follow-up by chest CT without contrast is recommended in 3 months to confirm persistence. This recommendation follows the consensus statement: Recommendations for the Managementof Subsolid Pulmonary Nodules Detected at CT: A Statement from the Fleischner Society as published in Radiology 2013; 266:304-317. 3. 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 Assessment for potential risk factor modification, dietarytherapy or pharmacologic therapy may be warranted, if clinically indicated. 4. Multiple borderline enlarged and minimally enlarged mediastinal lymph nodes. These are nonspecific. Attention on the short-term follow-up chest CT in 3 months is recommended.  Assessment/Plan 1. Interstitial lung  disease - needs a new referral due to insurance to Dr. Philmore Paliamaswamy--has not followed up as directed -cont oxygen which has been recommended all of the time, but he does not wear it faithfully -dry cough is worsening - Ambulatory referral to Pulmonology  2. Primary osteoarthritis of right knee - says he wants his right knee replaced, but is not even taking pain medication regularly -encouraged use of tylenol on a schedule at least 500mg  daily max 3000mg  daily -also informed that I could give him a knee injection of steroid, but they want to see orthopedics now -also encouraged regular exercise - AMB referral to orthopedics  3. Gout of multiple sites, unspecified cause, unspecified chronicity - said to be adherent with allopurinol but still had flare of gout per his wife in his foot - f/u Uric Acid--probably needs uloric, but I don't know if they'll be willing to pay for it   4. DM (diabetes mellitus) type II controlled with renal manifestation -I want him to take tradjenta, but his wife says it was $100 so he's not on it--he is also resistant to the idea that he's diabetic, but clearly is - Basic metabolic panel - Hemoglobin A1c  5. Essential hypertension, benign -bp elevated this am--had tea, is dyspneic when he walks in and again is not wearing his oxygen--sat only came up to 94% after he sat for a few mins -eats salty food, as well despite recommendations - Basic metabolic panel  6. Chronic idiopathic constipation -controlled with daily metamucil  Labs/tests ordered:   Orders Placed This Encounter  Procedures  . Uric Acid  . Basic metabolic panel  . Hemoglobin A1c  . AMB referral to orthopedics    Referral Priority:  Routine    Referral Type:  Consultation    Number of Visits Requested:  1  . Ambulatory referral to Pulmonology    Referral Priority:  Routine    Referral Type:  Consultation    Referral Reason:  Specialty Services Required    Requested Specialty:  Pulmonary  Disease    Number of Visits Requested:  1  Next appt:  3 mos.  Marigny Borre L. Danyael Alipio, D.O. Geriatrics [facility] [address], [place] [address] Cell Phone (Mon-Fri 8am-5pm):  [phone] On Call:  [phone] & follow prompts after 5pm & weekends Office Phone:  [phone] Office Fax:  [phone]

## 2014-10-30 LAB — HEMOGLOBIN A1C
Est. average glucose Bld gHb Est-mCnc: 157 mg/dL
Hgb A1c MFr Bld: 7.1 % — ABNORMAL HIGH (ref 4.8–5.6)

## 2014-10-30 LAB — BASIC METABOLIC PANEL
BUN/Creatinine Ratio: 13 (ref 10–22)
BUN: 20 mg/dL (ref 8–27)
CO2: 22 mmol/L (ref 18–29)
Calcium: 9.2 mg/dL (ref 8.6–10.2)
Chloride: 103 mmol/L (ref 97–108)
Creatinine, Ser: 1.55 mg/dL — ABNORMAL HIGH (ref 0.76–1.27)
GFR calc Af Amer: 50 mL/min/{1.73_m2} — ABNORMAL LOW (ref >59)
GFR calc non Af Amer: 43 mL/min/{1.73_m2} — ABNORMAL LOW (ref >59)
Glucose: 106 mg/dL — ABNORMAL HIGH (ref 65–99)
Potassium: 5.1 mmol/L (ref 3.5–5.2)
Sodium: 141 mmol/L (ref 134–144)

## 2014-10-30 LAB — URIC ACID: Uric Acid: 7.3 mg/dL (ref 3.7–8.6)

## 2014-11-05 ENCOUNTER — Ambulatory Visit: Payer: Self-pay | Admitting: Internal Medicine

## 2014-11-07 DIAGNOSIS — J8410 Pulmonary fibrosis, unspecified: Secondary | ICD-10-CM | POA: Diagnosis not present

## 2014-11-08 ENCOUNTER — Ambulatory Visit: Payer: Medicare Other | Admitting: Internal Medicine

## 2014-11-15 ENCOUNTER — Other Ambulatory Visit: Payer: Self-pay | Admitting: Nurse Practitioner

## 2014-11-15 DIAGNOSIS — M25561 Pain in right knee: Secondary | ICD-10-CM | POA: Diagnosis not present

## 2014-11-15 DIAGNOSIS — M1711 Unilateral primary osteoarthritis, right knee: Secondary | ICD-10-CM | POA: Diagnosis not present

## 2014-11-15 DIAGNOSIS — M1712 Unilateral primary osteoarthritis, left knee: Secondary | ICD-10-CM | POA: Diagnosis not present

## 2014-11-29 ENCOUNTER — Ambulatory Visit (INDEPENDENT_AMBULATORY_CARE_PROVIDER_SITE_OTHER): Payer: Medicare Other | Admitting: Internal Medicine

## 2014-11-29 ENCOUNTER — Encounter: Payer: Self-pay | Admitting: *Deleted

## 2014-11-29 ENCOUNTER — Encounter: Payer: Self-pay | Admitting: Internal Medicine

## 2014-11-29 VITALS — BP 118/58 | HR 61 | Temp 97.5°F | Resp 20 | Ht 65.0 in | Wt 168.0 lb

## 2014-11-29 DIAGNOSIS — J849 Interstitial pulmonary disease, unspecified: Secondary | ICD-10-CM

## 2014-11-29 DIAGNOSIS — J209 Acute bronchitis, unspecified: Secondary | ICD-10-CM | POA: Diagnosis not present

## 2014-11-29 MED ORDER — HYDROCOD POLST-CPM POLST ER 10-8 MG/5ML PO SUER: 5.0000 mL | Freq: Two times a day (BID) | ORAL | Status: DC | PRN

## 2014-11-29 MED ORDER — DOXYCYCLINE HYCLATE 100 MG PO TABS: 100.0000 mg | ORAL_TABLET | Freq: Two times a day (BID) | ORAL | Status: DC

## 2014-11-29 NOTE — Progress Notes (Signed)
Patient ID: Stanley Sullivan, male   DOB: 1941-01-06, 74 y.o.   MRN: [id number]   Location:  [facility]  Goals of Care: Advanced Directive information Does patient have an advance directive?: No, Would patient like information on creating an advanced directive?: Yes - Educational materials given   Chief Complaint  Patient presents with  . Acute Visit    cough, congestion    HPI: Patient is a 74 y.o. [place] Male seen in the office today for "a cold."    He got a cold from his wife.   He's had it for a week.   No headache.  Mostly coughing and cannot sleep at night due to dyspnea and wheezing.  White and yellow sputum.   No pain in chest.  Some abdominal discomfort from cough.   Afebrile at home and here.   Has inhaler at home but not using it. Has been using cough syrup that is orange in a clear bottle which has helped, but they request a prescription cough syrup and antibiotics. He does have interstitial lung disease and is diabetic.   As usual, he is not wearing his oxygen has prescribed.  Review of Systems:  Review of Systems  Constitutional: Positive for malaise/fatigue. Negative for fever and chills.  HENT: Positive for congestion and sore throat. Negative for ear discharge, ear pain and tinnitus.        Sore throat initially but gone now  Eyes: Negative for pain, discharge and redness.  Respiratory: Positive for cough and sputum production. Negative for hemoptysis, shortness of breath and wheezing.   Cardiovascular: Negative for chest pain.  Gastrointestinal: Negative for nausea, vomiting and diarrhea.  Genitourinary: Positive for frequency.  Musculoskeletal: Positive for joint pain. Negative for falls.  Neurological: Negative for dizziness, weakness and headaches.  Psychiatric/Behavioral: Negative for memory loss.    Past Medical History  Diagnosis Date  . Osteoarthrosis, unspecified whether generalized or localized,  unspecified site   . Pain in joint, lower leg   . Chronic kidney disease, stage III (moderate)   . Other specified visual disturbances   . Unspecified disorder of kidney and ureter   . Other abnormal glucose   . Obesity, unspecified   . Urticaria, unspecified   . Allergy, unspecified not elsewhere classified   . Pain in limb   . Gout, unspecified   . Other and unspecified hyperlipidemia   . External hemorrhoids without mention of complication   . Organic sleep apnea, unspecified   . Unspecified essential hypertension   . Hypertrophy of prostate without urinary obstruction and other lower urinary tract symptoms (LUTS)   . Other atopic dermatitis and related conditions     Past Surgical History  Procedure Laterality Date  . Cataract extraction      No Known Allergies Medications: Patient's Medications  New Prescriptions   CHLORPHENIRAMINE-HYDROCODONE (TUSSIONEX PENNKINETIC ER) 10-8 MG/5ML SUER    Take 5 mLs by mouth every 12 (twelve) hours as needed for cough.   DOXYCYCLINE (VIBRA-TABS) 100 MG TABLET    Take 1 tablet (100 mg total) by mouth 2 (two) times daily.  Previous Medications   ACETAMINOPHEN (TYLENOL) 500 MG TABLET    Take 500 mg by mouth every 6 (six) hours as needed for pain. Take 1-2 tablets by mouth every 6-8 hours as needed for pain.   ALLOPURINOL (ZYLOPRIM) 100 MG TABLET    Take 1 tablet (100 mg total) by mouth daily. For gout   ATENOLOL (  TENORMIN) 100 MG TABLET    TAKE 1 TABLET BY MOUTH ONCE DAILY FOR BLOOD PRESSURE   FUROSEMIDE (LASIX) 20 MG TABLET    Take 1 tablet (20 mg total) by mouth daily. For swelling of legs   GLUCOSE BLOOD TEST STRIP    Use as instructed   LANCETS (ONETOUCH ULTRASOFT) LANCETS    Use as instructed   LISINOPRIL (PRINIVIL,ZESTRIL) 5 MG TABLET    Take 1 tablet (5 mg total) by mouth daily. For blood pressure and diabetes   SIMVASTATIN (ZOCOR) 40 MG TABLET    TAKE 1 TABLET BY MOUTH DAILY FOR CHOLESTEROL   SIMVASTATIN (ZOCOR) 80 MG TABLET    Take  1 tablet (80 mg total) by mouth daily.   TAMSULOSIN (FLOMAX) 0.4 MG CAPS CAPSULE    TAKE 1 CAPSULE BY MOUTH ONCE A DAY for your prostate  Modified Medications   No medications on file  Discontinued Medications   No medications on file    Physical Exam: Filed Vitals:   11/29/14 1025  BP: 118/58  Pulse: 61  Temp: 97.5 F (36.4 C)  TempSrc: Oral  Resp: 20  Height: 5\' 5"  (1.651 m)  Weight: 168 lb (76.204 kg)  SpO2: 88%   Physical Exam  Constitutional: He is oriented to person, place, and time. He appears well-developed and well-nourished. No distress.  HENT:  Head: Normocephalic and atraumatic.  Right Ear: External ear normal.  Left Ear: External ear normal.  Nose: Nose normal.  Mouth/Throat: Oropharynx is clear and moist. No oropharyngeal exudate.  Eyes: Conjunctivae and EOM are normal. Pupils are equal, round, and reactive to light.  Neck: Neck supple.  Cardiovascular: Normal rate, regular rhythm, normal heart sounds and intact distal pulses.   Pulmonary/Chest: Effort normal.  Coarse dry rhonchi present throughout both lung fields  Abdominal: Bowel sounds are normal.  Musculoskeletal:  Walks without assistive device  Lymphadenopathy:    He has no cervical adenopathy.  Neurological: He is alert and oriented to person, place, and time.  Skin: Skin is warm and dry.    Labs reviewed: Basic Metabolic Panel:  Recent Labs  16/10/96 1029 08/09/14 1139 10/29/14 0904  NA 143 143 141  K 4.8 4.7 5.1  CL 105 106 103  CO2 21 23 22   GLUCOSE 105* 79 106*  BUN 17 21 20   CREATININE 1.88* 1.67* 1.55*  CALCIUM 9.2 9.0 9.2  TSH  --  2.180  --    Liver Function Tests:  Recent Labs  08/09/14 1139  AST 17  ALT 7  ALKPHOS 65  BILITOT 0.7  PROT 6.3   No results for input(s): LIPASE, AMYLASE in the last 8760 hours. No results for input(s): AMMONIA in the last 8760 hours. CBC:  Recent Labs  01/29/14 1153 08/09/14 1139  WBC 10.8 8.5  NEUTROABS 7.2* 5.0  HGB 13.9  15.1  HCT 43.3 47.0  MCV 72* 74*  PLT 225 195   Lipid Panel:  Recent Labs  01/29/14 1153 05/07/14 1029 08/09/14 1139  CHOL 195 172 172  HDL 48 41 43  LDLCALC 119* 103* 107*  TRIG 139 142 110  CHOLHDL 4.1 4.2 4.0   Lab Results  Component Value Date   HGBA1C 7.1* 10/29/2014    Assessment/Plan 1. Acute bronchitis, unspecified organism - due to his interstitial lung disease, I will treat his URI with abx which seems to have already reached the bronchitis stage (to prevent pneumonia)--also he is diabetic - doxycycline (VIBRA-TABS) 100 MG tablet; Take 1  tablet (100 mg total) by mouth 2 (two) times daily.  Dispense: 20 tablet; Refill: 0 - chlorpheniramine-HYDROcodone (TUSSIONEX PENNKINETIC ER) 10-8 MG/5ML SUER; Take 5 mLs by mouth every 12 (twelve) hours as needed for cough.  Dispense: 115 mL; Refill: 0  2. Interstitial lung disease - should be using his oxygen as directed and his inhaler when more short of breath or wheezing -f/u with Dr. Marchelle Gearing as I've been recommending for several months now - doxycycline (VIBRA-TABS) 100 MG tablet; Take 1 tablet (100 mg total) by mouth 2 (two) times daily.  Dispense: 20 tablet; Refill: 0  Labs/tests ordered: no new Next appt:  Is supposed to have a regular visit scheduled in 3 mos, but they left without making it (this occurs each time)  Gerasimos Plotts L. Arvil Utz, D.O. Geriatrics [facility] [address], [place] [address] Cell Phone (Mon-Fri 8am-5pm):  [phone] On Call:  [phone] & follow prompts after 5pm & weekends Office Phone:  [phone] Office Fax:  [phone]

## 2014-12-08 DIAGNOSIS — J8410 Pulmonary fibrosis, unspecified: Secondary | ICD-10-CM | POA: Diagnosis not present

## 2014-12-28 ENCOUNTER — Other Ambulatory Visit: Payer: Self-pay | Admitting: *Deleted

## 2014-12-28 DIAGNOSIS — I10 Essential (primary) hypertension: Secondary | ICD-10-CM

## 2014-12-28 MED ORDER — ATENOLOL 100 MG PO TABS: ORAL_TABLET | ORAL | Status: DC

## 2014-12-28 NOTE — Telephone Encounter (Signed)
[facility]  

## 2015-01-08 DIAGNOSIS — J8410 Pulmonary fibrosis, unspecified: Secondary | ICD-10-CM | POA: Diagnosis not present

## 2015-01-31 ENCOUNTER — Other Ambulatory Visit: Payer: Self-pay | Admitting: *Deleted

## 2015-01-31 DIAGNOSIS — M109 Gout, unspecified: Secondary | ICD-10-CM

## 2015-01-31 MED ORDER — ALLOPURINOL 100 MG PO TABS: 100.0000 mg | ORAL_TABLET | Freq: Every day | ORAL | Status: DC

## 2015-01-31 NOTE — Telephone Encounter (Signed)
Patient requested to be faxed to pharmacy 

## 2015-02-01 ENCOUNTER — Encounter: Payer: Self-pay | Admitting: Internal Medicine

## 2015-02-01 ENCOUNTER — Ambulatory Visit (INDEPENDENT_AMBULATORY_CARE_PROVIDER_SITE_OTHER): Payer: Medicare Other | Admitting: Internal Medicine

## 2015-02-01 VITALS — BP 136/88 | HR 60 | Ht 65.0 in | Wt 169.0 lb

## 2015-02-01 DIAGNOSIS — J849 Interstitial pulmonary disease, unspecified: Secondary | ICD-10-CM

## 2015-02-01 NOTE — Progress Notes (Signed)
Subjective:    Patient ID: Stanley Sullivan, male    DOB: May 04, 1940, 74 y.o.   MRN: [id number]  HPI   OV 02/01/2015  Chief Complaint  Patient presents with  . Follow-up    Pt here for ROV. Pt states his breathing is unchanged since last OV. Pt denies cough, CP/tightness. Pt did not have lung biopsy.    74 year old [place] immigrant with clinical interstitial lung disease not otherwise specified. I last saw him in summer 2015. At that time diagnosed him with interstitial lung disease. He had Dr. 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 His CT chest at that time was not classic for UIP. I recommended a surgical lung biopsy but he deferred. He is now here with his wife for follow-up. She tells me that patient is nervous about medical procedures and was scared of the biopsy and therefore it was deferred.  In the last 1 and 1 year in 2 months and dyspnea stable. He is more bothered by backache and knee pain then dyspnea. Nevertheless he does have dyspnea on exertion relieved by rest. Oxygen also helps relieve dyspnea. Wife categorically states that he's even scared of flu shots and it'll be a difficult task for him to have surgical lung biopsy. Also of note that traveling to [place] in December 2060 for 2 months and they want to know about safety of travel and oxygen policy. He has been to [place] by air using his portable oxygen systems are he is familiar  Denies cough but he is on ACE inhibitor. Last visit I asked him to stop it but it looks like he still continues this  Walking desaturation test 185 feet 3 laps on room air: walked 1 lap to 85% - similar to last year   has a past medical history of Osteoarthrosis, unspecified whether generalized or localized, unspecified site; Pain in joint, lower leg; Chronic kidney disease, stage III (moderate); Other specified visual disturbances; Unspecified disorder of kidney and ureter; Other abnormal glucose;  Obesity, unspecified; Urticaria, unspecified; Allergy, unspecified not elsewhere classified; Pain in limb; Gout, unspecified; Other and unspecified hyperlipidemia; External hemorrhoids without mention of complication; Organic sleep apnea, unspecified; Unspecified essential hypertension; Hypertrophy of prostate without urinary obstruction and other lower urinary tract symptoms (LUTS); and Other atopic dermatitis and related conditions.   reports that he quit smoking about 21 years ago. His smoking use included Cigarettes. He has a 7.5 pack-year smoking history. He has quit using smokeless tobacco. His smokeless tobacco use included Chew.  Past Surgical History  Procedure Laterality Date  . Cataract extraction      No Known Allergies  Immunization History  Administered Date(s) Administered  . Influenza Split 12/13/2014  . Influenza Whole 02/14/2007, 01/17/2013  . Influenza,inj,Quad PF,36+ Mos 01/29/2014  . Pneumococcal Conjugate-13 02/25/2014  . Pneumococcal Polysaccharide-23 02/14/2007  . Td 04/30/2008  . Zoster 09/10/2013    No family history on file.   Current outpatient prescriptions:  .  acetaminophen (TYLENOL) 500 MG tablet, Take 500 mg by mouth every 6 (six) hours as needed for pain. Take 1-2 tablets by mouth every 6-8 hours as needed for pain., Disp: , Rfl:  .  allopurinol (ZYLOPRIM) 100 MG tablet, Take 1 tablet (100 mg total) by mouth daily. For gout, Disp: 90 tablet, Rfl: 0 .  atenolol (TENORMIN) 100 MG tablet, Take one tablet by mouth once daily for blood pressure, Disp: 90 tablet, Rfl: 1 .  chlorpheniramine-HYDROcodone (TUSSIONEX PENNKINETIC  ER) 10-8 MG/5ML SUER, Take 5 mLs by mouth every 12 (twelve) hours as needed for cough., Disp: 115 mL, Rfl: 0 .  furosemide (LASIX) 20 MG tablet, Take 1 tablet (20 mg total) by mouth daily. For swelling of legs, Disp: 90 tablet, Rfl: 1 .  glucose blood test strip, Use as instructed, Disp: 100 each, Rfl: 12 .  Lancets (ONETOUCH ULTRASOFT)  lancets, Use as instructed, Disp: 100 each, Rfl: 12 .  lisinopril (PRINIVIL,ZESTRIL) 5 MG tablet, Take 1 tablet (5 mg total) by mouth daily. For blood pressure and diabetes, Disp: 90 tablet, Rfl: 0 .  simvastatin (ZOCOR) 40 MG tablet, TAKE 1 TABLET BY MOUTH DAILY FOR CHOLESTEROL, Disp: 90 tablet, Rfl: 1 .  tamsulosin (FLOMAX) 0.4 MG CAPS capsule, TAKE 1 CAPSULE BY MOUTH ONCE A DAY for your prostate, Disp: 90 capsule, Rfl: 1      Review of Systems Positive for shortness of breath, fatigue, back pain and knee pain    Objective:   Physical Exam  Constitutional: He is oriented to person, place, and time. He appears well-developed and well-nourished. No distress.  HENT:  Head: Normocephalic and atraumatic.  Right Ear: External ear normal.  Left Ear: External ear normal.  Mouth/Throat: Oropharynx is clear and moist. No oropharyngeal exudate.  Eyes: Conjunctivae and EOM are normal. Pupils are equal, round, and reactive to light. Right eye exhibits no discharge. Left eye exhibits no discharge. No scleral icterus.  Neck: Normal range of motion. Neck supple. No JVD present. No tracheal deviation present. No thyromegaly present.  Cardiovascular: Normal rate, regular rhythm and intact distal pulses.  Exam reveals no gallop and no friction rub.   No murmur heard. Pulmonary/Chest: Effort normal. No respiratory distress. He has no wheezes. He has rales. He exhibits no tenderness.  Crackles at base  Abdominal: Soft. Bowel sounds are normal. He exhibits no distension and no mass. There is no tenderness. There is no rebound and no guarding.  Musculoskeletal: Normal range of motion. He exhibits no edema or tenderness.  Lymphadenopathy:    He has no cervical adenopathy.  Neurological: He is alert and oriented to person, place, and time. He has normal reflexes. No cranial nerve deficit. Coordination normal.  Skin: Skin is warm and dry. No rash noted. He is not diaphoretic. No erythema. No pallor.    Psychiatric: He has a normal mood and affect. His behavior is normal. Judgment and thought content normal.  Nursing note and vitals reviewed.  Filed Vitals:   02/01/15 1440  BP: 136/88  Pulse: 60  Height:  (1.651 m)  Weight: 169 lb (76.658 kg)  SpO2: 90%          Assessment & Plan:     ICD-9-CM ICD-10-CM   1. ILD (interstitial lung disease) (HCC) 515 J84.9     We need to restage  Disease severity and possible cause - explained this to the wife and patient and they agree. Although if his ILD is undifferentiated he might and very likely refuse biopsy. BAsed on walk test and symptoms - stable x 1 year  Plan HRCT next few to several weeks Full PFT next few to several weeks Walk test either this visit or next visit in office Continue o2 as before for now Re: plane travel dec 2016 to [place]- find out airline oxygen policy Flu shot 02/01/2015  Followup 4-6 weeks with Dr Elly Modena after CT and PFT  > 50% of this > 25 min visit spent in face to face counseling  or coordination of care    Dr. Kalman Shan, M.D., [facility].C.P Pulmonary and Critical Care Medicine Staff Physician [facility]  Pulmonary and Critical Care Pager: [phone], If no answer or between  15:00h - 7:00h: call [phone]  02/01/2015 3:00 PM

## 2015-02-01 NOTE — Patient Instructions (Signed)
ICD-9-CM ICD-10-CM   1. ILD (interstitial lung disease) (HCC) 515 J84.9    We need to restage  Disease severity and possible cause  Plan HRCT next few to several weeks Full PFT next few to several weeks Walk test either this visit or next visit in office Continue o2 as before for now Re: plane travel dec 2016 to [place]- find out airline oxygen policy Flu shot 02/01/2015  Followup 4-6 weeks with Dr Elly Modena after CT and PFT

## 2015-02-07 DIAGNOSIS — J8410 Pulmonary fibrosis, unspecified: Secondary | ICD-10-CM | POA: Diagnosis not present

## 2015-02-10 ENCOUNTER — Ambulatory Visit (INDEPENDENT_AMBULATORY_CARE_PROVIDER_SITE_OTHER)
Admission: RE | Admit: 2015-02-10 | Discharge: 2015-02-10 | Disposition: A | Discharge status: Home / Self Care | Payer: Medicare Other | Source: Ambulatory Visit | Attending: Internal Medicine | Admitting: Internal Medicine

## 2015-02-10 DIAGNOSIS — J849 Interstitial pulmonary disease, unspecified: Secondary | ICD-10-CM | POA: Diagnosis not present

## 2015-02-10 DIAGNOSIS — R0602 Shortness of breath: Secondary | ICD-10-CM | POA: Diagnosis not present

## 2015-02-14 ENCOUNTER — Telehealth: Payer: Self-pay | Admitting: Internal Medicine

## 2015-02-14 LAB — FECAL OCCULT BLOOD, GUAIAC: FECAL OCCULT BLD: NEGATIVE

## 2015-02-14 NOTE — Telephone Encounter (Signed)
Called and spoke to pt's wife. Informed her of the results and recs per MR. Pt's wife states pt already sees Dr. Jacinto HalimGanji. Advised pt's wife to schedule OV with Dr. Jacinto HalimGanji. Pt's wife verbalized understanding and denied any further questions or concerns at this time.

## 2015-02-14 NOTE — Telephone Encounter (Signed)
  Let Mr. Stanley Sullivan know that CT scan of the chest does show interstitial lung disease. I will discuss this with him at follow-up in November 2016. CT scan of the chest also shows coronary artery calcification. Given his [place] ethnicity I would like him to see a cardiologist especially before he travels to [place] end the year.   If he likes to see an [place] cardiologist then I recommend Dr Jacinto HalimGanji. He can also have stress test in [place] with a good local cardiologist - can be cheaper

## 2015-02-15 ENCOUNTER — Telehealth: Payer: Self-pay | Admitting: Internal Medicine

## 2015-02-15 NOTE — Telephone Encounter (Signed)
Called spoke with spouse. She needed the # for Dr. Jacinto HalimGanji office to call for appt. Number given. Nothing further needed

## 2015-02-18 DIAGNOSIS — R0602 Shortness of breath: Secondary | ICD-10-CM | POA: Diagnosis not present

## 2015-02-18 DIAGNOSIS — J84112 Idiopathic pulmonary fibrosis: Secondary | ICD-10-CM | POA: Diagnosis not present

## 2015-02-18 DIAGNOSIS — I2510 Atherosclerotic heart disease of native coronary artery without angina pectoris: Secondary | ICD-10-CM | POA: Diagnosis not present

## 2015-02-18 DIAGNOSIS — E785 Hyperlipidemia, unspecified: Secondary | ICD-10-CM | POA: Diagnosis not present

## 2015-03-01 DIAGNOSIS — R202 Paresthesia of skin: Secondary | ICD-10-CM | POA: Diagnosis not present

## 2015-03-01 DIAGNOSIS — R252 Cramp and spasm: Secondary | ICD-10-CM | POA: Diagnosis not present

## 2015-03-10 ENCOUNTER — Encounter: Payer: Self-pay | Admitting: Internal Medicine

## 2015-03-10 ENCOUNTER — Ambulatory Visit (INDEPENDENT_AMBULATORY_CARE_PROVIDER_SITE_OTHER): Payer: Medicare Other | Admitting: Internal Medicine

## 2015-03-10 VITALS — BP 140/78 | HR 65 | Temp 98.8°F | Ht 65.0 in | Wt 160.0 lb

## 2015-03-10 DIAGNOSIS — M79604 Pain in right leg: Secondary | ICD-10-CM

## 2015-03-10 DIAGNOSIS — E785 Hyperlipidemia, unspecified: Secondary | ICD-10-CM

## 2015-03-10 DIAGNOSIS — J849 Interstitial pulmonary disease, unspecified: Secondary | ICD-10-CM | POA: Diagnosis not present

## 2015-03-10 DIAGNOSIS — J8410 Pulmonary fibrosis, unspecified: Secondary | ICD-10-CM | POA: Diagnosis not present

## 2015-03-10 DIAGNOSIS — I10 Essential (primary) hypertension: Secondary | ICD-10-CM | POA: Diagnosis not present

## 2015-03-10 DIAGNOSIS — J208 Acute bronchitis due to other specified organisms: Secondary | ICD-10-CM | POA: Diagnosis not present

## 2015-03-10 DIAGNOSIS — N400 Enlarged prostate without lower urinary tract symptoms: Secondary | ICD-10-CM | POA: Diagnosis not present

## 2015-03-10 MED ORDER — TAMSULOSIN HCL 0.4 MG PO CAPS: ORAL_CAPSULE | ORAL | Status: DC

## 2015-03-10 MED ORDER — GLUCOSE BLOOD VI STRP: ORAL_STRIP | Status: DC

## 2015-03-10 MED ORDER — LISINOPRIL 5 MG PO TABS: 5.0000 mg | ORAL_TABLET | Freq: Every day | ORAL | Status: DC

## 2015-03-10 NOTE — Progress Notes (Signed)
Patient ID: Stanley Sullivan, male   DOB: 05-Jun-1940, 74 y.o.   MRN: [id number]   Location: [facility] Provider: Gwenith Spitz. Renato Gails, D.O., C.M.D.  Code Status: full code Goals of Care: Advanced Directive information  not done--has been counseled in the past and they have not done this  Chief Complaint  Patient presents with  . Acute Visit    Cold: cough-productive at times. Cough is worse at night   . Leg Pain    Right leg pain x 1 month  . Medication Management    Cholesterol medication was changed, patient will need lipid check here in 1 month     HPI: Patient is a 74 y.o. male seen in the office today for an acute visit for a productive cough worse at night.  Going on for at least 10 days.  His wife says he tried all of the otc medication.  Can't sleep b/c of too much cough.  Has tried the mucinex, nyquil.  Sore throat from the coughing.  Tightness and wheezing.  Wearing his oxygen today--using 4 liters today.  Does use 3-4 when he uses it.      He also has had right leg pain for a month.  Behind the knee.  Cannot walk well due to pain.  Had ultrasound of legs with Dr. Jacinto Halim.    He saw Dr. Jacinto Halim who changed his simvastatin to atorvastatin .  We will update his list today.  Review of Systems:  Review of Systems  Constitutional: Negative for fever and chills.  HENT: Positive for congestion and sore throat. Negative for ear pain.   Eyes: Negative for blurred vision, pain, discharge and redness.  Respiratory: Positive for cough, sputum production, shortness of breath and wheezing.        Actually using his oxygen  Cardiovascular: Negative for leg swelling.       Tightness in chest  Gastrointestinal: Negative for nausea, vomiting and diarrhea.  Musculoskeletal: Positive for joint pain. Negative for falls.       Right leg pain behind his knee worse with walking; ABIs showed some mild PVD but not enough for symptoms  Neurological: Positive for headaches.    Psychiatric/Behavioral: The patient has insomnia.     Past Medical History  Diagnosis Date  . Osteoarthrosis, unspecified whether generalized or localized, unspecified site   . Pain in joint, lower leg   . Chronic kidney disease, stage III (moderate)   . Other specified visual disturbances   . Unspecified disorder of kidney and ureter   . Other abnormal glucose   . Obesity, unspecified   . Urticaria, unspecified   . Allergy, unspecified not elsewhere classified   . Pain in limb   . Gout, unspecified   . Other and unspecified hyperlipidemia   . External hemorrhoids without mention of complication   . Organic sleep apnea, unspecified   . Unspecified essential hypertension   . Hypertrophy of prostate without urinary obstruction and other lower urinary tract symptoms (LUTS)   . Other atopic dermatitis and related conditions     Past Surgical History  Procedure Laterality Date  . Cataract extraction      No Known Allergies    Medication List       This list is accurate as of: 03/10/15  4:00 PM.  Always use your most recent med list.               acetaminophen 500 MG tablet  Commonly known as:  TYLENOL  Take 500 mg by mouth every 6 (six) hours as needed for pain. Take 1-2 tablets by mouth every 6-8 hours as needed for pain.     allopurinol 100 MG tablet  Commonly known as:  ZYLOPRIM  Take 1 tablet (100 mg total) by mouth daily. For gout     atenolol 100 MG tablet  Commonly known as:  TENORMIN  Take one tablet by mouth once daily for blood pressure     atorvastatin 40 MG tablet  Commonly known as:  LIPITOR  Take 40 mg by mouth daily.     furosemide 20 MG tablet  Commonly known as:  LASIX  Take 1 tablet (20 mg total) by mouth daily. For swelling of legs     glucose blood test strip  Use as instructed     lisinopril 5 MG tablet  Commonly known as:  PRINIVIL,ZESTRIL  Take 1 tablet (5 mg total) by mouth daily. For blood pressure and diabetes     onetouch  ultrasoft lancets  Use as instructed     tamsulosin 0.4 MG Caps capsule  Commonly known as:  FLOMAX  TAKE 1 CAPSULE BY MOUTH ONCE A DAY for your prostate        Health Maintenance  Topic Date Due  . FOOT EXAM  07/09/1949  . HEMOGLOBIN A1C  05/01/2015  . OPHTHALMOLOGY EXAM  09/14/2015  . INFLUENZA VACCINE  11/29/2015  . COLONOSCOPY  04/30/2018  . TETANUS/TDAP  04/30/2018  . ZOSTAVAX  Completed  . PNA vac Low Risk Adult  Completed    Physical Exam: Filed Vitals:   03/10/15 1543  BP: 140/78  Pulse: 65  Temp: 98.8 F (37.1 C)  TempSrc: Oral  Height:  (1.651 m)  Weight: 160 lb (72.576 kg)  SpO2: 90%   Body mass index is 26.63 kg/(m^2). Physical Exam  Constitutional: He appears well-developed and well-nourished.  HENT:  Head: Normocephalic and atraumatic.  Right Ear: External ear normal.  Left Ear: External ear normal.  Nose: Nose normal.  Mouth/Throat: Oropharynx is clear and moist. No oropharyngeal exudate.  Eyes: Conjunctivae are normal.  Neck: Neck supple.  Pulmonary/Chest: He is in respiratory distress. He has wheezes.  And coarse rhonchi; wearing 3L O2  Musculoskeletal: Normal range of motion. He exhibits tenderness.  Behind right knee  Lymphadenopathy:    He has no cervical adenopathy.  Neurological: He is alert.  Skin: Skin is warm and dry.    Labs reviewed: Basic Metabolic Panel:  Recent Labs  16/10/96 1029 08/09/14 1139 10/29/14 0904  NA 143 143 141  K 4.8 4.7 5.1  CL 105 106 103  CO2 GLUCOSE 105* 79 106*  BUN CREATININE 1.88* 1.67* 1.55*  CALCIUM 9.2 9.0 9.2  TSH  --  2.180  --    Liver Function Tests:  Recent Labs  08/09/14 1139  AST 17  ALT 7  ALKPHOS 65  BILITOT 0.7  PROT 6.3  ALBUMIN 3.8   No results for input(s): LIPASE, AMYLASE in the last 8760 hours. No results for input(s): AMMONIA in the last 8760 hours. CBC:  Recent Labs  08/09/14 1139  WBC 8.5  NEUTROABS 5.0  HGB 15.1  HCT 47.0    MCV 74*  PLT 195   Lipid Panel:  Recent Labs  05/07/14 1029 08/09/14 1139  CHOL 172 172  HDL 41 43  LDLCALC 103* 107*  TRIG 142 110  CHOLHDL 4.2 4.0   Lab Results  Component Value Date   HGBA1C 7.1* 10/29/2014    Procedures since last visit: requested copy and reviewed ABI report from Dr. Jacinto HalimGanji  Assessment/Plan 1. Acute bronchitis -sent for CXR to get done asap so I will have results later today or tomorrow morning to rule out pneumonia, but suspect bronchitis -if bronchitis--doxycycline will be prescribed -other instructions:Drink plenty of water (at least 6 glasses per day) Get enough rest Use coricidin BP cough syrup 2 teaspoons every 6 hours as needed for coughing May also continue plain mucinex (not D or DM) Use your oxygen. Let me know if you get a fever or have difficulty with eating and drinking.  2.  Right leg pain: obtain regular ultrasound of right leg to r/o baker's cyst  3.  Hyperlipidemia:  F/u lab fasting  4.  BPH:  Stable symptoms, likes to take med as needed instead of as it was directed  5.  HTN:  Educated not to use meds with DM or D due to worsening HTN and to avoid sudafed like meds -cont regular regimen Labs/tests ordered:   Orders Placed This Encounter  Procedures  . DG Chest 2 View    Standing Status: Future     Number of Occurrences: 1     Standing Expiration Date: 05/09/2016    Order Specific Question:  Reason for Exam (SYMPTOM  OR DIAGNOSIS REQUIRED)    Answer:  interstitial lung disease with acute infection; wheezing    Order Specific Question:  Preferred imaging location?    Answer:  [facility]  . Lipid panel    Standing Status: Future     Number of Occurrences:      Standing Expiration Date: 03/10/2016    Order Specific Question:  Has the patient fasted?    Answer:  Yes    Next appt:  Was supposed to have a medical mgt visit already scheduled for this month, but, as usual, none was made when they left--they will  call when something is wrong and expect all problems addressed at once  Aseret Hoffman L. Larna Capelle, D.O. Geriatrics [facility] [address] [place], [place] [address] Cell Phone (Mon-Fri 8am-5pm):  [phone] On Call:  [phone] & follow prompts after 5pm & weekends Office Phone:  [phone] Office Fax:  [phone]

## 2015-03-10 NOTE — Patient Instructions (Signed)
Drink plenty of water (at least 6 glasses per day) Get enough rest Use coricidin BP cough syrup 2 teaspoons every 6 hours as needed for coughing May also continue plain mucinex (not D or DM) Use your oxygen.  Let me know if you get a fever or have difficulty with eating and drinking.  GET THE CHEST XRAY DONE EARLY IN THE MORNING SO WE CAN DETERMINE IF YOU HAVE A PNEUMONIA OR ONLY A BRONCHITIS.

## 2015-03-11 ENCOUNTER — Other Ambulatory Visit: Payer: Self-pay | Admitting: Internal Medicine

## 2015-03-11 ENCOUNTER — Telehealth: Payer: Self-pay | Admitting: *Deleted

## 2015-03-11 ENCOUNTER — Ambulatory Visit
Admission: RE | Admit: 2015-03-11 | Discharge: 2015-03-11 | Disposition: A | Discharge status: Home / Self Care | Payer: Medicare Other | Source: Ambulatory Visit | Attending: Internal Medicine | Admitting: Internal Medicine

## 2015-03-11 DIAGNOSIS — M79604 Pain in right leg: Secondary | ICD-10-CM

## 2015-03-11 DIAGNOSIS — J208 Acute bronchitis due to other specified organisms: Secondary | ICD-10-CM

## 2015-03-11 DIAGNOSIS — J209 Acute bronchitis, unspecified: Secondary | ICD-10-CM

## 2015-03-11 DIAGNOSIS — R05 Cough: Secondary | ICD-10-CM | POA: Diagnosis not present

## 2015-03-11 MED ORDER — DOXYCYCLINE HYCLATE 100 MG PO TABS
100.0000 mg | ORAL_TABLET | Freq: Two times a day (BID) | ORAL | Status: DC
Start: 2015-03-11 — End: 2015-07-18

## 2015-03-11 NOTE — Telephone Encounter (Signed)
Patient wife came into the office requesting Arterial Doppler Results done 03/06/15.  Per Dr. Reed--Mild decrease in circulation, but does not explain pain in my opinion. I instructed wife of results and she wanted to know what is the next step with the pain. I spoke with Dr. Renato Gailseed and she stated that she was going to order another ultrasound to test for a Baker's Cyst behind the right knee and for patient to keep his appointment with Dr. Jacinto HalimGanji. Wife understood and agreed.

## 2015-03-15 ENCOUNTER — Ambulatory Visit
Admission: RE | Admit: 2015-03-15 | Discharge: 2015-03-15 | Disposition: A | Discharge status: Home / Self Care | Payer: Medicare Other | Source: Ambulatory Visit | Attending: Internal Medicine | Admitting: Internal Medicine

## 2015-03-15 DIAGNOSIS — M25561 Pain in right knee: Secondary | ICD-10-CM | POA: Diagnosis not present

## 2015-03-15 DIAGNOSIS — M79604 Pain in right leg: Secondary | ICD-10-CM

## 2015-03-16 ENCOUNTER — Encounter: Payer: Self-pay | Admitting: *Deleted

## 2015-03-18 ENCOUNTER — Ambulatory Visit: Payer: Medicare Other | Admitting: Internal Medicine

## 2015-03-19 ENCOUNTER — Encounter: Payer: Self-pay | Admitting: Internal Medicine

## 2015-03-22 ENCOUNTER — Other Ambulatory Visit: Payer: Medicare Other

## 2015-03-22 DIAGNOSIS — I10 Essential (primary) hypertension: Secondary | ICD-10-CM | POA: Diagnosis not present

## 2015-03-22 DIAGNOSIS — E1129 Type 2 diabetes mellitus with other diabetic kidney complication: Secondary | ICD-10-CM

## 2015-03-22 DIAGNOSIS — E785 Hyperlipidemia, unspecified: Secondary | ICD-10-CM

## 2015-03-23 LAB — BASIC METABOLIC PANEL
BUN/Creatinine Ratio: 13 (ref 10–22)
BUN: 20 mg/dL (ref 8–27)
CO2: 22 mmol/L (ref 18–29)
Calcium: 8.9 mg/dL (ref 8.6–10.2)
Chloride: 105 mmol/L (ref 97–106)
Creatinine, Ser: 1.58 mg/dL — ABNORMAL HIGH (ref 0.76–1.27)
GFR calc Af Amer: 49 mL/min/{1.73_m2} — ABNORMAL LOW (ref >59)
GFR calc non Af Amer: 42 mL/min/{1.73_m2} — ABNORMAL LOW (ref >59)
Glucose: 91 mg/dL (ref 65–99)
Potassium: 4.7 mmol/L (ref 3.5–5.2)
Sodium: 140 mmol/L (ref 136–144)

## 2015-03-23 LAB — LIPID PANEL
Chol/HDL Ratio: 5 ratio units (ref 0.0–5.0)
Cholesterol, Total: 144 mg/dL (ref 100–199)
HDL: 29 mg/dL — ABNORMAL LOW (ref >39)
LDL Calculated: 96 mg/dL (ref 0–99)
Triglycerides: 94 mg/dL (ref 0–149)
VLDL Cholesterol Cal: 19 mg/dL (ref 5–40)

## 2015-03-23 LAB — HEMOGLOBIN A1C
Est. average glucose Bld gHb Est-mCnc: 151 mg/dL
Hgb A1c MFr Bld: 6.9 % — ABNORMAL HIGH (ref 4.8–5.6)

## 2015-03-28 ENCOUNTER — Other Ambulatory Visit: Payer: Self-pay

## 2015-03-28 MED ORDER — ATORVASTATIN CALCIUM 40 MG PO TABS: 40.0000 mg | ORAL_TABLET | Freq: Every day | ORAL | Status: DC

## 2015-03-28 NOTE — Telephone Encounter (Signed)
Spoke with wife about lab, wife request 90 day supply of Lipitor called to pharmacy, they are leaving 12[place]7 to [place] for a month.

## 2015-03-30 ENCOUNTER — Telehealth: Payer: Self-pay | Admitting: Internal Medicine

## 2015-03-30 NOTE — Telephone Encounter (Signed)
Called and spoke with patient's son [name]. He states that his father will be leaving on 04/04/15 [place] go [place] seas and will not be returning until March 2017. He currently uses a poc and cpap and will need to take this with him on his trip. His son request that we write a letter explaining that it is a medical necessity that the equipment travels with him. Verified airlines as [employer] and the number is [phone]. Informed patient that once letter was received and completed we will place it at the front of the office and call him for pick up. Pt's son voiced understanding and had no further questions.   Called [employer] at [phone] and spoke with [facility] LLCJeanette. She stated that there is a letter to be completed and will be faxed to [phone]. Will send message to [facility] for [facility]

## 2015-03-31 NOTE — Telephone Encounter (Signed)
Patient's son Stanley Sullivan called back and wanted to inform us that his father will also need a [employer] POC form completed. I printed this off of the [employer] website and placed in MR's look at on B side. Patient will also need a letter for both airlines so that he can bring his CPAP on board. Stanley Sullivan voiced understanding and had no further questions. Will discuss with Robynn PaneElise

## 2015-03-31 NOTE — Telephone Encounter (Addendum)
Form received per Robynn PaneElise. Called and spoke with patient's son [name]. Informed [name] that MR will not be in office untill 04/04/15 for afternoon clinic. He stated that would be fine and to just have [employer] PaneElise to call him once signed and placed at the front of the office for pick up. Patient's son voiced understanding and had no further questions. Will await MR's return to office on 04/04/15 for signature

## 2015-04-04 ENCOUNTER — Encounter: Payer: Self-pay | Admitting: Emergency Medicine

## 2015-04-04 NOTE — Telephone Encounter (Signed)
Stanley Sullivan, has this been done?  Please advise.

## 2015-04-04 NOTE — Telephone Encounter (Signed)
Forms and letter have been signed today (04/04/2015) by MR and have been placed up front for pick up. Called and spoke to Acacia VillasNick and informed him they are ready for pick up on 04/05/2015. [name] verbalized understanding and denied any further questions or concerns at this time.

## 2015-04-07 ENCOUNTER — Telehealth: Payer: Self-pay | Admitting: Internal Medicine

## 2015-04-07 NOTE — Telephone Encounter (Signed)
Spoke with pt's daughter in law again, states that she doesn't know if pt will need clearance from our office since he has not picked up extra battery pack yet.  I advised that typically any clearance for flight takes place before the day of flight, but that if any further questions were to be had that they can call our office.  Nothing further needed.

## 2015-04-07 NOTE — Telephone Encounter (Signed)
Spoke with Selena BattenKim at [employer], states that the [facility] in [place] has a battery for pt's POC that they are holding for pt.  Address is [address] [place] [id number] [phone] Manager is Jillyn HiddenGary.    Called pt's daughter in law to [employer] info.  She is aware.  Nothing further needed.

## 2015-04-07 NOTE — Telephone Encounter (Signed)
Pt's daughter-in-law returned call to verify DME as [employer]. I explained to her that Morrie Sheldonshley had contacted Selena BattenKim with [employer] and is awaiting call back to confirm if the [place] office has a battery for the patient. I explained to her that once we receive a response from Selena BattenKim we would return her call. She voiced understanding and had no further questions.

## 2015-04-07 NOTE — Telephone Encounter (Signed)
Spoke with pt's daughter-in-law, states that pt is flying internationally TODAY, states that the airline is requiring the pt to have an extra battery for his 02 concentrator.  Pt is not at home right now, [place].  Pt and daughter in law do not know who supplies pt's o2.  There was an order for 02 placed to [employer] in 2015. Called and spoke with Selena BattenKim at [employer], states that the pt does get his 02 through them, but they have no extra batteries to give pt.  I advised that pt is [place] outside of [place] and needs the battery today.  Selena BattenKim is going to call offices closer to the pt to see if there is an extra battery for this pt to use.    Called pt's daughter-in-law, states that [place] [phone] [place] through [employer] flight leaves at 5:00 this afternoon.   Nash Dimmeralled Kim with [employer] with this info.  Selena BattenKim will call [place] office to see if they have a battery for this pt.  Will await call back.

## 2015-04-09 DIAGNOSIS — J8410 Pulmonary fibrosis, unspecified: Secondary | ICD-10-CM | POA: Diagnosis not present

## 2015-05-10 DIAGNOSIS — J8410 Pulmonary fibrosis, unspecified: Secondary | ICD-10-CM | POA: Diagnosis not present

## 2015-06-10 DIAGNOSIS — J8410 Pulmonary fibrosis, unspecified: Secondary | ICD-10-CM | POA: Diagnosis not present

## 2015-07-08 DIAGNOSIS — J8410 Pulmonary fibrosis, unspecified: Secondary | ICD-10-CM | POA: Diagnosis not present

## 2015-07-18 ENCOUNTER — Encounter: Payer: Self-pay | Admitting: Internal Medicine

## 2015-07-18 ENCOUNTER — Ambulatory Visit (INDEPENDENT_AMBULATORY_CARE_PROVIDER_SITE_OTHER): Payer: Medicare Other | Admitting: Internal Medicine

## 2015-07-18 VITALS — BP 160/80 | HR 54 | Temp 98.1°F | Wt 167.0 lb

## 2015-07-18 DIAGNOSIS — I10 Essential (primary) hypertension: Secondary | ICD-10-CM

## 2015-07-18 DIAGNOSIS — J849 Interstitial pulmonary disease, unspecified: Secondary | ICD-10-CM

## 2015-07-18 DIAGNOSIS — M792 Neuralgia and neuritis, unspecified: Secondary | ICD-10-CM | POA: Diagnosis not present

## 2015-07-18 DIAGNOSIS — M533 Sacrococcygeal disorders, not elsewhere classified: Secondary | ICD-10-CM

## 2015-07-18 DIAGNOSIS — N183 Chronic kidney disease, stage 3 unspecified: Secondary | ICD-10-CM

## 2015-07-18 DIAGNOSIS — M79604 Pain in right leg: Secondary | ICD-10-CM

## 2015-07-18 DIAGNOSIS — M10071 Idiopathic gout, right ankle and foot: Secondary | ICD-10-CM

## 2015-07-18 DIAGNOSIS — E785 Hyperlipidemia, unspecified: Secondary | ICD-10-CM

## 2015-07-18 DIAGNOSIS — M109 Gout, unspecified: Secondary | ICD-10-CM

## 2015-07-18 DIAGNOSIS — N400 Enlarged prostate without lower urinary tract symptoms: Secondary | ICD-10-CM | POA: Diagnosis not present

## 2015-07-18 DIAGNOSIS — E1122 Type 2 diabetes mellitus with diabetic chronic kidney disease: Secondary | ICD-10-CM

## 2015-07-18 MED ORDER — ATORVASTATIN CALCIUM 40 MG PO TABS: 40.0000 mg | ORAL_TABLET | Freq: Every day | ORAL | Status: DC

## 2015-07-18 MED ORDER — GABAPENTIN 300 MG PO CAPS: 300.0000 mg | ORAL_CAPSULE | Freq: Three times a day (TID) | ORAL | Status: DC

## 2015-07-18 MED ORDER — TAMSULOSIN HCL 0.4 MG PO CAPS: ORAL_CAPSULE | ORAL | Status: DC

## 2015-07-18 MED ORDER — LISINOPRIL 5 MG PO TABS: 5.0000 mg | ORAL_TABLET | Freq: Every day | ORAL | Status: DC

## 2015-07-18 MED ORDER — FUROSEMIDE 20 MG PO TABS: 20.0000 mg | ORAL_TABLET | Freq: Every day | ORAL | Status: AC

## 2015-07-18 MED ORDER — ATENOLOL 100 MG PO TABS: ORAL_TABLET | ORAL | Status: DC

## 2015-07-18 MED ORDER — ALLOPURINOL 100 MG PO TABS: 100.0000 mg | ORAL_TABLET | Freq: Every day | ORAL | Status: DC

## 2015-07-18 MED ORDER — GLUCOSE BLOOD VI STRP: ORAL_STRIP | Status: DC

## 2015-07-18 NOTE — Progress Notes (Signed)
Patient ID: Stanley Sullivan, male   DOB: [birth year], 75 y.o.   MRN: [id number]   Location:  [facility] Provider: Danyele Smejkal L. Renato Gails, D.O., C.M.D.  Code Status: full code Goals of Care:  Advanced Directives 07/18/2015  Does patient have an advance directive? No  Would patient like information on creating an advanced directive? No - patient declined information    Chief Complaint  Patient presents with  . Acute Visit    pain in right leg x    HPI: Patient is a 75 y.o. male seen today for an acute visit for pain in his right leg for 6 mos.  I have not seen him since Nov of last year.  Pain in right leg is all the time.  Cannot walk.  No pain sitting.    Pt has had c/o pain in both knees off and on for a few years.   BP not at goal b/c pt is not adherent with his medications and currently in pain.    While in [place], he apparently had an MRI of his lumbosacral spine 04/29/15.  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  It was felt that his pain in his leg was from his back.    As usual, he is not wearing his oxygen as directed.  POX was 90% and he looked short of breath when walking in.    He did not take the diabetes medication.  Tradjenta was very expensive for him.  He is taking an herbal med from [place] and his wife is wondering if it is working.    Past Medical History  Diagnosis Date  . Osteoarthrosis, unspecified whether generalized or localized, unspecified site   . Pain in joint, lower leg   . Chronic kidney disease, stage III (moderate)   . Other specified visual disturbances   . Unspecified disorder of kidney and ureter   . Other abnormal glucose   . Obesity, unspecified   . Urticaria, unspecified   . Allergy, unspecified not elsewhere classified   . Pain in limb    . Gout, unspecified   . Other and unspecified hyperlipidemia   . External hemorrhoids without mention of complication   . Organic sleep apnea, unspecified   . Unspecified essential hypertension   . Hypertrophy of prostate without urinary obstruction and other lower urinary tract symptoms (LUTS)   . Other atopic dermatitis and related conditions     Past Surgical History  Procedure Laterality Date  . Cataract extraction      No Known Allergies    Medication List       This list is accurate as of: 07/18/15  1:36 PM.  Always use your most recent med list.               acetaminophen 500 MG tablet  Commonly known as:  TYLENOL  Take 500 mg by mouth every 6 (six) hours as needed for pain. Take 1-2 tablets by mouth every 6-8 hours as needed for pain.     allopurinol 100 MG tablet  Commonly known as:  ZYLOPRIM  Take 1 tablet (100 mg total) by mouth daily. For gout     atenolol 100 MG tablet  Commonly known as:  TENORMIN  Take one tablet by mouth once daily for blood pressure  atorvastatin 40 MG tablet  Commonly known as:  LIPITOR  Take 1 tablet (40 mg total) by mouth daily.     furosemide 20 MG tablet  Commonly known as:  LASIX  Take 1 tablet (20 mg total) by mouth daily. For swelling of legs     glucose blood test strip  Use as instructed     lisinopril 5 MG tablet  Commonly known as:  PRINIVIL,ZESTRIL  Take 1 tablet (5 mg total) by mouth daily. For blood pressure and diabetes     onetouch ultrasoft lancets  Use as instructed     tamsulosin 0.4 MG Caps capsule  Commonly known as:  FLOMAX  TAKE 1 CAPSULE BY MOUTH ONCE A DAY for your prostate        Review of Systems:  Review of Systems  Constitutional: Negative for fever, chills and malaise/fatigue.  HENT: Negative for congestion.   Eyes: Negative for blurred vision.  Respiratory: Positive for shortness of breath. Negative for cough.   Cardiovascular: Negative for chest pain and leg swelling.    Gastrointestinal: Negative for abdominal pain, constipation, blood in stool and melena.  Genitourinary: Negative for dysuria.  Musculoskeletal: Positive for myalgias, back pain and joint pain. Negative for falls.  Skin: Negative for rash.  Neurological: Positive for sensory change. Negative for dizziness, loss of consciousness and weakness.  Endo/Heme/Allergies: Does not bruise/bleed easily.  Psychiatric/Behavioral: Negative for depression and memory loss.    Health Maintenance  Topic Date Due  . FOOT EXAM  07[birth year]09/1949  . OPHTHALMOLOGY EXAM  09/14/2015  . HEMOGLOBIN A1C  09/19/2015  . INFLUENZA VACCINE  11/29/2015  . TETANUS/TDAP  04/30/2018  . ZOSTAVAX  Completed  . PNA vac Low Risk Adult  Completed    Physical Exam: Filed Vitals:   07/18/15 1318  BP: 160/80  Pulse: 54  Temp: 98.1 F (36.7 C)  TempSrc: Oral  Weight: 167 lb (75.751 kg)  SpO2: 90%   Body mass index is 27.79 kg/(m^2). Physical Exam  Constitutional: He is oriented to person, place, and time. He appears well-developed and well-nourished. No distress.  Cardiovascular: Normal rate, regular rhythm and normal heart sounds.   Pulmonary/Chest: Effort normal. He has wheezes.  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  Musculoskeletal: He exhibits tenderness.  Right posterior leg and sacroiliac region  Neurological: He is alert and oriented to person, place, and time.  Skin: Skin is warm and dry.    Labs reviewed: Basic Metabolic Panel:  Recent Labs  16/10/96 1139 10/29/14 0904 03/22/15 1109  NA 143 141 140  K 4.7 5.1 4.7  CL 106 103 105  CO2 GLUCOSE 79 106* 91  BUN CREATININE 1.67* 1.55* 1.58*  CALCIUM 9.0 9.2 8.9  TSH 2.180  --   --    Liver Function Tests:  Recent Labs  08/09/14 1139  AST 17  ALT 7  ALKPHOS 65  BILITOT 0.7  PROT 6.3  ALBUMIN 3.8   No results for input(s): LIPASE, AMYLASE in the last 8760 hours. No results  for input(s): AMMONIA in the last 8760 hours. CBC:  Recent Labs  08/09/14 1139  WBC 8.5  NEUTROABS 5.0  HGB 15.1  HCT 47.0  MCV 74*  PLT 195   Lipid Panel:  Recent Labs  08/09/14 1139 03/22/15 0959  CHOL 172 144  HDL 43 29*  LDLCALC 107* 96  TRIG 110 94  CHOLHDL 4.0 5.0  Lab Results  Component Value Date   HGBA1C 6.9* 03/22/2015    Procedures since last visit: MRI lumbar spine from [place] to be scanned.  See above.  Assessment/Plan 1. Right leg pain - in his right posterior leg/thigh--shooting pain -pt with MRI showing herniated disc in [place] and some compression of nerves -pain severely limiting his ability to walk  -sometimes in his bilateral SI joints and his left posterior leg  -denies red flag symptoms of loss of sensation in peri area, urinary or fecal incontinence - MR Lumbar Spine Wo Contrast; Future - gabapentin (NEURONTIN) 300 MG capsule; Take 1 capsule (300 mg total) by mouth 3 (three) times daily.  Dispense: 30 capsule; Refill: 3  2. Sacroiliac pain - see above - MR Lumbar Spine Wo Contrast; Future - gabapentin (NEURONTIN) 300 MG capsule; Take 1 capsule (300 mg total) by mouth 3 (three) times daily.  Dispense: 30 capsule; Refill: 3  3. Neuropathic pain - see above - MR Lumbar Spine Wo Contrast; Future - B12 and Folate Panel; Future - Vitamin D, 25-hydroxy; Future - gabapentin (NEURONTIN) 300 MG capsule; Take 1 capsule (300 mg total) by mouth 3 (three) times daily.  Dispense: 30 capsule; Refill: 3  4. Controlled type 2 diabetes mellitus with stage 3 chronic kidney disease, without long-term current use of insulin (HCC) - has been worsening over time due to nonadherence with meds and diet and exercise program -most recently taking an herbal supplement instead of tradjenta which is "too expensive"--suspect related to a deductible early in the year -renal function precludes use of the majority of the options including metformin, alternatives to  tradjenta - CBC with Differential/Platelet; Future - Basic metabolic panel; Future - Hemoglobin A1c; Future - glucose blood test strip; Use as instructed  Dispense: 100 each; Refill: 12  5. Hyperlipidemia -cont current statin therapy, claims to be taking - Lipid panel; Future  6. Interstitial lung disease (HCC) - is to use O2 all the time, but uses only as needed at home - Basic metabolic panel; Future  7. BPH (benign prostatic hyperplasia) - stable, no incontinence - tamsulosin (FLOMAX) 0.4 MG CAPS capsule; TAKE 1 CAPSULE BY MOUTH ONCE A DAY for your prostate  Dispense: 90 capsule; Refill: 1  8. Essential hypertension, benign - bp poorly controlled - suspect he's out of medication, but they deny this - lisinopril (PRINIVIL,ZESTRIL) 5 MG tablet; Take 1 tablet (5 mg total) by mouth daily. For blood pressure and diabetes  Dispense: 90 tablet; Refill: 1 - furosemide (LASIX) 20 MG tablet; Take 1 tablet (20 mg total) by mouth daily. For swelling of legs  Dispense: 90 tablet; Refill: 1 - atenolol (TENORMIN) 100 MG tablet; Take one tablet by mouth once daily for blood pressure  Dispense: 90 tablet; Refill: 1  9. Acute gout of right ankle, unspecified cause - cont allopurinol - allopurinol (ZYLOPRIM) 100 MG tablet; Take 1 tablet (100 mg total) by mouth daily. For gout  Dispense: 90 tablet; Refill: 1  Labs/tests ordered:   Orders Placed This Encounter  Procedures  . MR Lumbar Spine Wo Contrast    Standing Status: Future     Number of Occurrences:      Standing Expiration Date: 09/16/2016    Order Specific Question:  Reason for Exam (SYMPTOM  OR DIAGNOSIS REQUIRED)    Answer:  posterior right leg pain for at least 6 mos; had MRI in [place] with some herniated disc and compression of thecal sac; also has occasional pain in back of  left leg and SI regions when walking    Order Specific Question:  Preferred imaging location?    Answer:  [facility] (table limit-550lbs)    Order  Specific Question:  What is the patient's sedation requirement?    Answer:  No Sedation    Order Specific Question:  Does the patient have a pacemaker or implanted devices?    Answer:  No  . CBC with Differential/Platelet    Standing Status: Future     Number of Occurrences:      Standing Expiration Date: 08/18/2015  . Basic metabolic panel    Standing Status: Future     Number of Occurrences:      Standing Expiration Date: 08/18/2015    Order Specific Question:  Has the patient fasted?    Answer:  Yes  . Hemoglobin A1c    Standing Status: Future     Number of Occurrences:      Standing Expiration Date: 08/18/2015  . Lipid panel    Standing Status: Future     Number of Occurrences:      Standing Expiration Date: 08/18/2015    Order Specific Question:  Has the patient fasted?    Answer:  Yes  . B12 and Folate Panel    Standing Status: Future     Number of Occurrences:      Standing Expiration Date: 08/18/2015  . Vitamin D, 25-hydroxy    Standing Status: Future     Number of Occurrences:      Standing Expiration Date: 08/18/2015   Next appt:  10/27/2015 for med mgt  Eula Mazzola L. Zayvion Stailey, D.O. Geriatrics [facility] [address] [place], [place] [address] Cell Phone (Mon-Fri 8am-5pm):  [phone] On Call:  [phone] & follow prompts after 5pm & weekends Office Phone:  [phone] Office Fax:  [phone]

## 2015-07-20 ENCOUNTER — Other Ambulatory Visit: Payer: Medicare Other

## 2015-07-20 DIAGNOSIS — N183 Chronic kidney disease, stage 3 (moderate): Secondary | ICD-10-CM | POA: Diagnosis not present

## 2015-07-20 DIAGNOSIS — E785 Hyperlipidemia, unspecified: Secondary | ICD-10-CM | POA: Diagnosis not present

## 2015-07-20 DIAGNOSIS — E1122 Type 2 diabetes mellitus with diabetic chronic kidney disease: Secondary | ICD-10-CM | POA: Diagnosis not present

## 2015-07-20 DIAGNOSIS — J849 Interstitial pulmonary disease, unspecified: Secondary | ICD-10-CM | POA: Diagnosis not present

## 2015-07-20 DIAGNOSIS — M792 Neuralgia and neuritis, unspecified: Secondary | ICD-10-CM

## 2015-07-21 LAB — CBC WITH DIFFERENTIAL/PLATELET
Basophils Absolute: 0 10*3/uL (ref 0.0–0.2)
Basos: 1 %
EOS (ABSOLUTE): 0.7 10*3/uL — ABNORMAL HIGH (ref 0.0–0.4)
Eos: 8 %
Hematocrit: 44.2 % (ref 37.5–51.0)
Hemoglobin: 13.8 g/dL (ref 12.6–17.7)
Immature Grans (Abs): 0 10*3/uL (ref 0.0–0.1)
Immature Granulocytes: 1 %
Lymphocytes Absolute: 2.4 10*3/uL (ref 0.7–3.1)
Lymphs: 29 %
MCH: 23.5 pg — ABNORMAL LOW (ref 26.6–33.0)
MCHC: 31.2 g/dL — ABNORMAL LOW (ref 31.5–35.7)
MCV: 75 fL — ABNORMAL LOW (ref 79–97)
Monocytes Absolute: 0.7 10*3/uL (ref 0.1–0.9)
Monocytes: 8 %
Neutrophils Absolute: 4.6 10*3/uL (ref 1.4–7.0)
Neutrophils: 53 %
Platelets: 208 10*3/uL (ref 150–379)
RBC: 5.87 x10E6/uL — ABNORMAL HIGH (ref 4.14–5.80)
RDW: 20.3 % — ABNORMAL HIGH (ref 12.3–15.4)
WBC: 8.4 10*3/uL (ref 3.4–10.8)

## 2015-07-21 LAB — BASIC METABOLIC PANEL
BUN/Creatinine Ratio: 10 (ref 10–22)
BUN: 16 mg/dL (ref 8–27)
CO2: 20 mmol/L (ref 18–29)
Calcium: 8.9 mg/dL (ref 8.6–10.2)
Chloride: 102 mmol/L (ref 96–106)
Creatinine, Ser: 1.54 mg/dL — ABNORMAL HIGH (ref 0.76–1.27)
GFR calc Af Amer: 50 mL/min/{1.73_m2} — ABNORMAL LOW (ref >59)
GFR calc non Af Amer: 43 mL/min/{1.73_m2} — ABNORMAL LOW (ref >59)
Glucose: 83 mg/dL (ref 65–99)
Potassium: 4.3 mmol/L (ref 3.5–5.2)
Sodium: 137 mmol/L (ref 134–144)

## 2015-07-21 LAB — LIPID PANEL
Chol/HDL Ratio: 4.5 ratio units (ref 0.0–5.0)
Cholesterol, Total: 167 mg/dL (ref 100–199)
HDL: 37 mg/dL — ABNORMAL LOW (ref >39)
LDL Calculated: 111 mg/dL — ABNORMAL HIGH (ref 0–99)
Triglycerides: 94 mg/dL (ref 0–149)
VLDL Cholesterol Cal: 19 mg/dL (ref 5–40)

## 2015-07-21 LAB — B12 AND FOLATE PANEL
Folate: 5.4 ng/mL (ref >3.0)
Vitamin B-12: 302 pg/mL (ref 211–946)

## 2015-07-21 LAB — HEMOGLOBIN A1C
Est. average glucose Bld gHb Est-mCnc: 143 mg/dL
Hgb A1c MFr Bld: 6.6 % — ABNORMAL HIGH (ref 4.8–5.6)

## 2015-07-21 LAB — VITAMIN D 25 HYDROXY (VIT D DEFICIENCY, FRACTURES): Vit D, 25-Hydroxy: 24.8 ng/mL — ABNORMAL LOW (ref 30.0–100.0)

## 2015-07-28 ENCOUNTER — Ambulatory Visit
Admission: RE | Admit: 2015-07-28 | Discharge: 2015-07-28 | Disposition: A | Discharge status: Home / Self Care | Payer: Medicare Other | Source: Ambulatory Visit | Attending: Internal Medicine | Admitting: Internal Medicine

## 2015-07-28 DIAGNOSIS — M533 Sacrococcygeal disorders, not elsewhere classified: Secondary | ICD-10-CM

## 2015-07-28 DIAGNOSIS — M79604 Pain in right leg: Secondary | ICD-10-CM

## 2015-07-28 DIAGNOSIS — M792 Neuralgia and neuritis, unspecified: Secondary | ICD-10-CM

## 2015-07-28 DIAGNOSIS — M5126 Other intervertebral disc displacement, lumbar region: Secondary | ICD-10-CM | POA: Diagnosis not present

## 2015-07-29 ENCOUNTER — Inpatient Hospital Stay
Admission: RE | Admit: 2015-07-29 | Discharge: 2015-07-29 | Disposition: A | Discharge status: Home / Self Care | Payer: Self-pay | Source: Ambulatory Visit | Attending: Internal Medicine | Admitting: Internal Medicine

## 2015-07-29 ENCOUNTER — Other Ambulatory Visit: Payer: Self-pay | Admitting: Internal Medicine

## 2015-07-29 DIAGNOSIS — M792 Neuralgia and neuritis, unspecified: Secondary | ICD-10-CM

## 2015-07-29 DIAGNOSIS — M79604 Pain in right leg: Secondary | ICD-10-CM

## 2015-07-29 DIAGNOSIS — M533 Sacrococcygeal disorders, not elsewhere classified: Secondary | ICD-10-CM

## 2015-08-02 ENCOUNTER — Telehealth: Payer: Self-pay | Admitting: Internal Medicine

## 2015-08-02 NOTE — Telephone Encounter (Signed)
We already gave them the results of the MRI.  I have not heard more from radiology after we asked for [name] to bring the MRI from [place] to the imaging center for comparison.  I believe Chrae spoke with Mrs. Polack about this yesterday.

## 2015-08-02 NOTE — Telephone Encounter (Signed)
Returned Mrs. Maxton call.... Left message per Dr. Ernest Mallickeed's comment.  We have not heard anymore from the radiology after we asked Mrs. Langlinais to bring the MRI from [place].....cdavis

## 2015-08-02 NOTE — Telephone Encounter (Signed)
Wife called for results of MRI, to Dr. Renato Gailseed for review.

## 2015-08-08 DIAGNOSIS — J8410 Pulmonary fibrosis, unspecified: Secondary | ICD-10-CM | POA: Diagnosis not present

## 2015-08-09 ENCOUNTER — Telehealth: Payer: Self-pay

## 2015-08-09 NOTE — Telephone Encounter (Signed)
The ultrasounds were to check his circulation and make sure he didn't have blood clots.  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  I was thrown off by the results from [place] b/c he has hardly ever mentioned any back trouble during appts, only focusing on the leg.  He needs to see orthopedics for possible knee surgery.

## 2015-08-09 NOTE — Telephone Encounter (Signed)
I spoke with patient's wife regarding MRI results. She is upset that the MRI did not show any reason for husband's leg pain. She does not understand why they were told that husband had a pinched nerve while in [place] but nothing can be found on any scan in [place].   She stated that patient has already been to two different ortho doctors and had 2 separate ultrasounds which show no reason for the pain. She did ask if patient's knee could possibly be causing pain in lower leg?   Patient and wife would like to have a face to face meeting with Dr. Renato Gailseed to discuss all imaging results and what else can be done to find out what is causing the pain. Wife has requested that an appointment be set up to discuss these findings in detail.

## 2015-08-09 NOTE — Telephone Encounter (Signed)
I spoke with patient's wife again to inform her of Dr. Ernest Mallickeed's message. Wife still does not understand why no one can find cause of the leg pain. She is upset because she cannot get the answers she needs. She said that she would call next week to set an appointment if her husband continued to be in pain. She stated that she understood that husbanded needed to see ortho for possible knee surgery.

## 2015-08-10 ENCOUNTER — Other Ambulatory Visit: Payer: Self-pay | Admitting: *Deleted

## 2015-08-10 DIAGNOSIS — M1711 Unilateral primary osteoarthritis, right knee: Secondary | ICD-10-CM

## 2015-08-11 DIAGNOSIS — M47817 Spondylosis without myelopathy or radiculopathy, lumbosacral region: Secondary | ICD-10-CM | POA: Diagnosis not present

## 2015-08-11 DIAGNOSIS — M4806 Spinal stenosis, lumbar region: Secondary | ICD-10-CM | POA: Diagnosis not present

## 2015-08-11 DIAGNOSIS — M1711 Unilateral primary osteoarthritis, right knee: Secondary | ICD-10-CM | POA: Diagnosis not present

## 2015-08-11 DIAGNOSIS — M1712 Unilateral primary osteoarthritis, left knee: Secondary | ICD-10-CM | POA: Diagnosis not present

## 2015-08-17 ENCOUNTER — Ambulatory Visit: Payer: Medicare Other | Admitting: Internal Medicine

## 2015-08-22 ENCOUNTER — Ambulatory Visit (INDEPENDENT_AMBULATORY_CARE_PROVIDER_SITE_OTHER): Payer: Medicare Other | Admitting: Internal Medicine

## 2015-08-22 ENCOUNTER — Telehealth: Payer: Self-pay | Admitting: *Deleted

## 2015-08-22 ENCOUNTER — Encounter: Payer: Self-pay | Admitting: Internal Medicine

## 2015-08-22 VITALS — BP 160/82 | HR 87 | Temp 98.1°F | Ht 65.0 in | Wt 163.0 lb

## 2015-08-22 DIAGNOSIS — Z91048 Other nonmedicinal substance allergy status: Secondary | ICD-10-CM

## 2015-08-22 DIAGNOSIS — J8410 Pulmonary fibrosis, unspecified: Secondary | ICD-10-CM

## 2015-08-22 DIAGNOSIS — R05 Cough: Secondary | ICD-10-CM

## 2015-08-22 DIAGNOSIS — R059 Cough, unspecified: Secondary | ICD-10-CM

## 2015-08-22 DIAGNOSIS — Z9109 Other allergy status, other than to drugs and biological substances: Secondary | ICD-10-CM

## 2015-08-22 MED ORDER — PREDNISONE 10 MG PO TABS: ORAL_TABLET | ORAL | Status: DC

## 2015-08-22 MED ORDER — ALBUTEROL SULFATE (2.5 MG/3ML) 0.083% IN NEBU: 2.5000 mg | INHALATION_SOLUTION | Freq: Four times a day (QID) | RESPIRATORY_TRACT | Status: DC | PRN

## 2015-08-22 NOTE — Progress Notes (Signed)
Patient ID: Stanley Sullivan, male   DOB: 1940/05/29, 75 y.o.   MRN: [id number]   Location:  [facility] Provider: Javani Spratt L. Renato Gails, D.O., C.M.D.  Goals of Care:  Advanced Directives 07/18/2015  Does patient have an advance directive? No  Would patient like information on creating an advanced directive? No - patient declined information   Chief Complaint  Patient presents with  . Acute Visit    cough, cold symptoms   HPI: Patient is a 75 y.o. 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  Did have nyquil last night. He mowed his grass which has previously caused him to have diffuse hives requiring prolonged steroids.  He's been counseled not to do this b/c of his severe reaction.  He is actually using his portable oxygen machine which is beeping when he came into the room.  They don't know why so advised calling the company.  BP very high again today. Did come down 20 pts on recheck.  He says he took his medication this morning.     Past Medical History  Diagnosis Date  . Osteoarthrosis, unspecified whether generalized or localized, unspecified site   . Pain in joint, lower leg   . Chronic kidney disease, stage III (moderate)   . Other specified visual disturbances   . Unspecified disorder of kidney and ureter   . Other abnormal glucose   . Obesity, unspecified   . Urticaria, unspecified   . Allergy, unspecified not elsewhere classified   . Pain in limb   . Gout, unspecified   . Other and unspecified hyperlipidemia   . External hemorrhoids without mention of complication   . Organic sleep apnea, unspecified   . Unspecified essential hypertension   . Hypertrophy of prostate without urinary obstruction and other lower urinary tract symptoms (LUTS)   . Other atopic dermatitis and related conditions     Past Surgical History  Procedure Laterality Date  . Cataract extraction      No Known Allergies    Medication List       This list is accurate as of: 08/22/15 10:19 AM.  Always use your most recent med list.               acetaminophen 500 MG tablet  Commonly known as:  TYLENOL  Take 500 mg by mouth every 6 (six) hours as needed for pain. Take 1-2 tablets by mouth every 6-8 hours as needed for pain.     allopurinol 100 MG tablet  Commonly known as:  ZYLOPRIM  Take 1 tablet (100 mg total) by mouth daily. For gout     atenolol 100 MG tablet  Commonly known as:  TENORMIN  Take one tablet by mouth once daily for blood pressure     atorvastatin 40 MG tablet  Commonly known as:  LIPITOR  Take 1 tablet (40 mg total) by mouth daily.     furosemide 20 MG tablet  Commonly known as:  LASIX  Take 1 tablet (20 mg total) by mouth daily. For swelling of legs     gabapentin 300 MG capsule  Commonly known as:  NEURONTIN  Take 1 capsule (300 mg total) by mouth 3 (three) times daily.     glucose blood test strip  Use as instructed     lisinopril 5 MG tablet  Commonly known as:  PRINIVIL,ZESTRIL  Take 1 tablet (5 mg total) by mouth daily.  For blood pressure and diabetes     onetouch ultrasoft lancets  Use as instructed     tamsulosin 0.4 MG Caps capsule  Commonly known as:  FLOMAX  TAKE 1 CAPSULE BY MOUTH ONCE A DAY for your prostate     vitamin B-12 1000 MCG tablet  Commonly known as:  CYANOCOBALAMIN  Take 1,000 mcg by mouth daily.     Vitamin D3 2000 units Tabs  Take by mouth daily.        Review of Systems:  Review of Systems  Constitutional: Negative for fever and chills.  HENT: Negative for congestion.   Eyes: Negative for discharge and redness.  Respiratory: Positive for cough, shortness of breath and wheezing. Negative for hemoptysis and sputum production.   Cardiovascular: Negative for chest pain.       Sore ribs from coughing  Gastrointestinal: Negative for abdominal pain.  Genitourinary: Negative for dysuria.  Musculoskeletal: Positive for back pain and  joint pain. Negative for falls.  Skin: Negative for itching and rash.  Neurological: Positive for tingling and sensory change. Negative for weakness and headaches.  Psychiatric/Behavioral: Negative for depression and memory loss.    Health Maintenance  Topic Date Due  . FOOT EXAM  07[birth year]09[birth year]1949  . OPHTHALMOLOGY EXAM  09/14/2015  . INFLUENZA VACCINE  11/29/2015  . HEMOGLOBIN A1C  01/20/2016  . TETANUS/TDAP  04/30/2018  . ZOSTAVAX  Completed  . PNA vac Low Risk Adult  Completed    Physical Exam: Filed Vitals:   08/22/15 1012  BP: 180/90  Pulse: 87  Temp: 98.1 F (36.7 C)  TempSrc: Oral  Height: 5\' 5"  (1.651 m)  Weight: 163 lb (73.936 kg)  SpO2: 92%   Body mass index is 27.12 kg/(m^2). Physical Exam  Constitutional: He is oriented to person, place, and time. He appears well-developed and well-nourished.  Cardiovascular: Normal rate, regular rhythm, normal heart sounds and intact distal pulses.   Pulmonary/Chest: He has wheezes.  Some inspiratory squeaks, expiratory wheezes in upper lobes, lower lobes ok; wearing his oxygen and only 92%  Musculoskeletal: Normal range of motion.  Neurological: He is alert and oriented to person, place, and time.  Skin: Skin is warm and dry. No rash noted.    Labs reviewed: Basic Metabolic Panel:  Recent Labs  16/10/96 0904 03/22/15 1109 07/20/15 0947  NA 141 140 137  K 5.1 4.7 4.3  CL 103 105 102  CO2 22 22 20   GLUCOSE 106* 91 83  BUN 20 20 16   CREATININE 1.55* 1.58* 1.54*  CALCIUM 9.2 8.9 8.9   Liver Function Tests: No results for input(s): AST, ALT, ALKPHOS, BILITOT, PROT, ALBUMIN in the last 8760 hours. No results for input(s): LIPASE, AMYLASE in the last 8760 hours. No results for input(s): AMMONIA in the last 8760 hours. CBC:  Recent Labs  07/20/15 0947  WBC 8.4  NEUTROABS 4.6  HCT 44.2  MCV 75*  PLT 208   Lipid Panel:  Recent Labs  03/22/15 0959 07/20/15 0947  CHOL 144 167  HDL 29* 37*  LDLCALC 96 111*    TRIG 94 94  CHOLHDL 5.0 4.5   Lab Results  Component Value Date   HGBA1C 6.6* 07/20/2015    Procedures since last visit: Mr Lumbar Spine Wo Contrast  08/08/2015  ADDENDUM REPORT: 08/08/2015 08:13 ADDENDUM: An outside study dated 04/29/2015 from [facility] has been obtained for comparison. The report from that study is not provided. No significant changes are observed. Electronically Signed   By:  Carey BullocksWilliam  Veazey M.D.   On: 08/08/2015 08:13  08/08/2015  CLINICAL DATA:  Right leg and left knee pain for 2 months. No acute injury or prior relevant surgery. Chronic kidney disease. EXAM: MRI LUMBAR SPINE WITHOUT CONTRAST TECHNIQUE: Multiplanar, multisequence MR imaging of the lumbar spine was performed. No intravenous contrast was administered. COMPARISON:  Limited correlation made with chest CT 02/10/2015. FINDINGS: Segmentation: Conventional anatomy assumed, with the last open disc space designated L5-S1. Alignment:  Normal. Bones: No worrisome osseous lesion, acute fracture or pars defect. There is mild Schmorl's node formation in the lower thoracic spine. The lumbar pedicles are somewhat short on a congenital basis. The visualized sacroiliac joints appear unremarkable. Conus medullaris: Extends to the L1-2 level and appears normal. Paraspinal and other soft tissues: No significant paraspinal findings. Disc levels: L1-2: Mild disc bulging, anterior osteophytes and Schmorl's node formation. No spinal stenosis or nerve root encroachment. L2-3: Mild disc bulging eccentric to the right with anterior osteophyte formation. No significant spinal stenosis or nerve root encroachment. L3-4: Disc height and hydration are relatively maintained. There is mild disc bulging with mild facet and ligamentous hypertrophy. No spinal stenosis or nerve root encroachment. L4-5: Disc desiccation with mild loss of disc height, disc bulging and a shallow central disc protrusion. There is moderate facet and ligamentous  hypertrophy, worse on the right. These factors contribute to mild spinal stenosis with mild narrowing of the lateral recesses. The foramina appear patent. L5-S1: Disc desiccation with loss of disc height, a central annular fissure and annular disc bulging. There is moderate to advanced facet hypertrophy, worse on the left. There is mild narrowing of the lateral recesses, worse on the left. The foramina are patent. No evidence of right-sided nerve root encroachment. IMPRESSION: 1. No acute findings or explanation for the patient's symptoms. No evidence of right-sided nerve root encroachment. 2. 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 No significant foraminal compromise. 3. Facet hypertrophy at L4-5 and L5-S1 may contribute to back pain. Electronically Signed: By: Carey BullocksWilliam  Veazey M.D. On: 07/28/2015 12:54   [facility]  07/29/2015  This examination belongs to an outside facility and is stored here for comparison purposes only.  Contact the originating outside institution for any associated report or interpretation.   Assessment/Plan 1. Interstitial pulmonary fibrosis (HCC) - is to wear his oxygen at all times--did actually come in wearing it today - due to cough, bronchospasms it seems, will treat with albuterol nebs (Rx given to get neb machine from O2 company and to call O2 company about machine beeping), pred taper due to likely allergic etiology - albuterol (PROVENTIL) (2.5 MG/3ML) 0.083% nebulizer solution; Take 3 mLs (2.5 mg total) by nebulization every 6 (six) hours as needed for wheezing or shortness of breath.  Dispense: 150 mL; Refill: 1 - DME Nebulizer machine - predniSONE (DELTASONE) 10 MG tablet; 5 tabs 4/24, 4 tabs 4/25, 3 tabs 4/26, 2 tabs 4/27, 1 tab 4/28, then stop  Dispense: 15 tablet; Refill: 0 -call back if not getting better for CXR  2. Cough -  albuterol, pred taper - if not improving, CXR and zyrtec - predniSONE (DELTASONE) 10 MG tablet; 5 tabs 4/24, 4 tabs 4/25, 3 tabs 4/26, 2 tabs 4/27, 1 tab 4/28, then stop  Dispense: 15 tablet; Refill: 0  3. Environmental allergies -has known grass allergy, but continues to mow grass against advice  Labs/tests  ordered:   Orders Placed This Encounter  Procedures  . DME Nebulizer machine   Next appt:  10/27/2015  Laurella Tull L. Joshawn Crissman, D.O. Geriatrics [facility] [address], [place] [address] Cell Phone (Mon-Fri 8am-5pm):  [phone] On Call:  [phone] & follow prompts after 5pm & weekends Office Phone:  [phone] Office Fax:  [phone]

## 2015-08-22 NOTE — Telephone Encounter (Signed)
Patient wife called and stated that she was in here this morning with her husband for an appointment and forgot the Rx for Promethazine with Codeine Cough Syrup. She [facility]. Please Advise.

## 2015-08-22 NOTE — Patient Instructions (Signed)
Ask someone else to cut the grass.  You are very allergic.    Please take the prednisone taper, use the nebulizer treatments and see how you do.   If you continue to have the cough, call us back for a chest xray.

## 2015-08-23 MED ORDER — PROMETHAZINE-CODEINE 6.25-10 MG/5ML PO SYRP: 5.0000 mL | ORAL_SOLUTION | Freq: Four times a day (QID) | ORAL | Status: DC | PRN

## 2015-08-23 NOTE — Telephone Encounter (Signed)
-----   Message from Kermit Baloiffany L Reed, DO sent at 08/23/2015 9:00 AM EDT -----    I've printed out the promethazine/codeine Rx there. I did forget to give it to them yesterday. It will need to be [facility] most likely b/c it is controlled and I'm not there to sign it by hand.

## 2015-08-23 NOTE — Telephone Encounter (Signed)
Medication Phoned into [facility] this morning and wife notified.

## 2015-09-07 DIAGNOSIS — J8410 Pulmonary fibrosis, unspecified: Secondary | ICD-10-CM | POA: Diagnosis not present

## 2015-09-12 ENCOUNTER — Encounter: Payer: Self-pay | Admitting: Internal Medicine

## 2015-09-12 ENCOUNTER — Ambulatory Visit (INDEPENDENT_AMBULATORY_CARE_PROVIDER_SITE_OTHER): Payer: Medicare Other | Admitting: Internal Medicine

## 2015-09-12 VITALS — BP 158/90 | HR 87 | Temp 98.0°F | Wt 159.0 lb

## 2015-09-12 DIAGNOSIS — J8410 Pulmonary fibrosis, unspecified: Secondary | ICD-10-CM

## 2015-09-12 DIAGNOSIS — M10472 Other secondary gout, left ankle and foot: Secondary | ICD-10-CM

## 2015-09-12 DIAGNOSIS — R05 Cough: Secondary | ICD-10-CM | POA: Diagnosis not present

## 2015-09-12 DIAGNOSIS — R059 Cough, unspecified: Secondary | ICD-10-CM

## 2015-09-12 NOTE — Patient Instructions (Addendum)
Stop using colchicine.  Take the prednisone taper I originally gave you for your allergic cough.  Take the entire package.  Continue allopurinol 100mg  one tablet daily.  Gout Gout is when your joints become red, sore, and swell (inflamed). This is caused by the buildup of uric acid crystals in the joints. Uric acid is a chemical that is normally in the blood. If the level of uric acid gets too high in the blood, these crystals form in your joints and tissues. Over time, these crystals can form into masses near the joints and tissues. These masses can destroy bone and cause the bone to look misshapen (deformed). HOME CARE   Do not take aspirin for pain.  Only take medicine as told by your doctor.  Rest the joint as much as you can. When in bed, keep sheets and blankets off painful areas.  Keep the sore joints raised (elevated).  Put warm or cold packs on painful joints. Use of warm or cold packs depends on which works best for you.  Use crutches if the painful joint is in your leg.  Drink enough fluids to keep your pee (urine) clear or pale yellow. Limit alcohol, sugary drinks, and drinks with fructose in them.  Follow your diet instructions. Pay careful attention to how much protein you eat. Include fruits, vegetables, whole grains, and fat-free or low-fat milk products in your daily diet. Talk to your doctor or dietitian about the use of coffee, vitamin C, and cherries. These may help lower uric a  6cid levels.  Keep a healthy body weight. GET HELP RIGHT AWAY IF:   You have watery poop (diarrhea), throw up (vomit), or have any side effects from medicines.  You do not feel better in 24 hours, or you are getting worse.  Your joint becomes suddenly more tender, and you have chills or a fever. MAKE SURE YOU:   Understand these instructions.  Will watch your condition.  Will get help right away if you are not doing well or get worse.   This information is not intended to replace  advice given to you by your health care provider. Make sure you discuss any questions you have with your health care provider.   Document Released: 01/24/2008 Document Revised: 05/07/2014 Document Reviewed: 11/28/2011 Elsevier Interactive Patient Education [employer]2016 Elsevier Inc.

## 2015-09-12 NOTE — Progress Notes (Signed)
Location:  [facility] clinic Provider: Brynja Marker L. Renato Gails, D.O., C.M.D.  Code Status: full code Goals of Care:  Advanced Directives 07/18/2015  Does patient have an advance directive? No  Would patient like information on creating an advanced directive? No - patient declined information   Chief Complaint  Patient presents with  . Acute Visit    gout, left foot    HPI: Patient is a 74 y.o. male seen today for an acute visit for left foot gout flare up.  Started Friday.  Taking two allopurinol per day since and two colchicine.  Supposedly was taking the allopurinol one daily as directed before this began.  His wife does report he was drinking a couple of beers over the weekend due to attending a wedding, when normally he does not drink alcohol.  He's also eaten some hot dogs.  Otherwise it sounds like they don't usually eat or drink high uric acid items.  Never took the prednisone taper as directed for his allergic cough.  Is still wearing oxygen.  Denies any cough now whatsoever.     Past Medical History  Diagnosis Date  . Osteoarthrosis, unspecified whether generalized or localized, unspecified site   . Pain in joint, lower leg   . Chronic kidney disease, stage III (moderate)   . Other specified visual disturbances   . Unspecified disorder of kidney and ureter   . Other abnormal glucose   . Obesity, unspecified   . Urticaria, unspecified   . Allergy, unspecified not elsewhere classified   . Pain in limb   . Gout, unspecified   . Other and unspecified hyperlipidemia   . External hemorrhoids without mention of complication   . Organic sleep apnea, unspecified   . Unspecified essential hypertension   . Hypertrophy of prostate without urinary obstruction and other lower urinary tract symptoms (LUTS)   . Other atopic dermatitis and related conditions     Past Surgical History  Procedure Laterality Date  . Cataract extraction      No Known Allergies    Medication List         This list is accurate as of: 09/12/15 10:54 AM.  Always use your most recent med list.               acetaminophen 500 MG tablet  Commonly known as:  TYLENOL  Take 500 mg by mouth every 6 (six) hours as needed for pain. Take 1-2 tablets by mouth every 6-8 hours as needed for pain.     albuterol (2.5 MG/3ML) 0.083% nebulizer solution  Commonly known as:  PROVENTIL  Take 3 mLs (2.5 mg total) by nebulization every 6 (six) hours as needed for wheezing or shortness of breath.     allopurinol 100 MG tablet  Commonly known as:  ZYLOPRIM  Take 1 tablet (100 mg total) by mouth daily. For gout     atenolol 100 MG tablet  Commonly known as:  TENORMIN  Take one tablet by mouth once daily for blood pressure     atorvastatin 40 MG tablet  Commonly known as:  LIPITOR  Take 1 tablet (40 mg total) by mouth daily.     furosemide 20 MG tablet  Commonly known as:  LASIX  Take 1 tablet (20 mg total) by mouth daily. For swelling of legs     gabapentin 300 MG capsule  Commonly known as:  NEURONTIN  Take 1 capsule (300 mg total) by mouth 3 (three) times daily.  glucose blood test strip  Use as instructed     lisinopril 5 MG tablet  Commonly known as:  PRINIVIL,ZESTRIL  Take 1 tablet (5 mg total) by mouth daily. For blood pressure and diabetes     onetouch ultrasoft lancets  Use as instructed     promethazine-codeine 6.25-10 MG/5ML syrup  Commonly known as:  PHENERGAN with CODEINE  Take 5 mLs by mouth every 6 (six) hours as needed for cough.     tamsulosin 0.4 MG Caps capsule  Commonly known as:  FLOMAX  TAKE 1 CAPSULE BY MOUTH ONCE A DAY for your prostate     vitamin B-12 1000 MCG tablet  Commonly known as:  CYANOCOBALAMIN  Take 1,000 mcg by mouth daily.     Vitamin D3 2000 units Tabs  Take by mouth daily.        Review of Systems:  Review of Systems  Constitutional: Negative for fever and chills.  HENT: Negative for congestion.   Respiratory: Negative for cough and  shortness of breath.        Wearing his oxygen  Cardiovascular: Negative for chest pain.  Gastrointestinal: Negative for abdominal pain.  Genitourinary: Negative for dysuria.  Musculoskeletal: Positive for joint pain. Negative for falls.       Left foot red, swollen, painful, walking on crutch   Skin: Negative for itching and rash.  Neurological: Negative for dizziness.  Psychiatric/Behavioral: Negative for depression and memory loss.    Health Maintenance  Topic Date Due  . FOOT EXAM  07/09/1950  . OPHTHALMOLOGY EXAM  09/14/2015  . INFLUENZA VACCINE  11/29/2015  . HEMOGLOBIN A1C  01/20/2016  . TETANUS/TDAP  04/30/2018  . ZOSTAVAX  Completed  . PNA vac Low Risk Adult  Completed    Physical Exam: Filed Vitals:   09/12/15 1046  BP: 158/90  Pulse: 87  Temp: 98 F (36.7 C)  TempSrc: Oral  Weight: 159 lb (72.122 kg)  SpO2: 95%   Body mass index is 26.46 kg/(m^2). Physical Exam  Constitutional: He is oriented to person, place, and time. He appears well-developed and well-nourished. No distress.  Cardiovascular: Normal rate, regular rhythm, normal heart sounds and intact distal pulses.   Pulmonary/Chest: Effort normal and breath sounds normal. No respiratory distress.  Musculoskeletal: Normal range of motion. He exhibits edema and tenderness.  Red, warm, swollen left foot  Neurological: He is alert and oriented to person, place, and time.  Skin: Skin is warm and dry.  Psychiatric: He has a normal mood and affect.    Labs reviewed: Basic Metabolic Panel:  Recent Labs  16/10/96 0904 03/22/15 1109 07/20/15 0947  NA 141 140 137  K 5.1 4.7 4.3  CL 103 105 102  CO2 22 22 20   GLUCOSE 106* 91 83  BUN 20 20 16   CREATININE 1.55* 1.58* 1.54*  CALCIUM 9.2 8.9 8.9   Liver Function Tests: No results for input(s): AST, ALT, ALKPHOS, BILITOT, PROT, ALBUMIN in the last 8760 hours. No results for input(s): LIPASE, AMYLASE in the last 8760 hours. No results for input(s):  AMMONIA in the last 8760 hours. CBC:  Recent Labs  07/20/15 0947  WBC 8.4  NEUTROABS 4.6  HCT 44.2  MCV 75*  PLT 208   Lipid Panel:  Recent Labs  03/22/15 0959 07/20/15 0947  CHOL 144 167  HDL 29* 37*  LDLCALC 96 111*  TRIG 94 94  CHOLHDL 5.0 4.5   Lab Results  Component Value Date   HGBA1C 6.6* 07/20/2015  Assessment/Plan 1. Other secondary acute gout of left foot -advised to use the prednisone taper I had prescribed a couple of weeks ago for cough that he never took as directed  2. Cough -resolved he says  3. Interstitial pulmonary fibrosis (HCC) -cont oxygen as directed by pulmonary  Labs/tests ordered:  No new Next appt:  10/27/2015  Frederic Tones L. Zenobia Kuennen, D.O. Geriatrics [facility] [address] [place], [place] [address] Cell Phone (Mon-Fri 8am-5pm):  [phone] On Call:  [phone] & follow prompts after 5pm & weekends Office Phone:  [phone] Office Fax:  [phone]

## 2015-09-22 DIAGNOSIS — M5416 Radiculopathy, lumbar region: Secondary | ICD-10-CM | POA: Diagnosis not present

## 2015-09-22 DIAGNOSIS — M4806 Spinal stenosis, lumbar region: Secondary | ICD-10-CM | POA: Diagnosis not present

## 2015-10-08 DIAGNOSIS — J8410 Pulmonary fibrosis, unspecified: Secondary | ICD-10-CM | POA: Diagnosis not present

## 2015-10-13 ENCOUNTER — Other Ambulatory Visit: Payer: Self-pay | Admitting: *Deleted

## 2015-10-13 DIAGNOSIS — M109 Gout, unspecified: Secondary | ICD-10-CM

## 2015-10-13 MED ORDER — ALLOPURINOL 100 MG PO TABS: 100.0000 mg | ORAL_TABLET | Freq: Every day | ORAL | Status: AC

## 2015-10-13 NOTE — Telephone Encounter (Signed)
Patient wife requested refill to be sent to pharmacy.

## 2015-10-27 ENCOUNTER — Ambulatory Visit: Payer: Medicare Other | Admitting: Internal Medicine

## 2015-10-31 ENCOUNTER — Other Ambulatory Visit: Payer: Self-pay

## 2015-10-31 MED ORDER — ATORVASTATIN CALCIUM 40 MG PO TABS: 40.0000 mg | ORAL_TABLET | Freq: Every day | ORAL | Status: DC

## 2015-11-07 DIAGNOSIS — J8410 Pulmonary fibrosis, unspecified: Secondary | ICD-10-CM | POA: Diagnosis not present

## 2015-12-08 DIAGNOSIS — J8410 Pulmonary fibrosis, unspecified: Secondary | ICD-10-CM | POA: Diagnosis not present

## 2015-12-15 ENCOUNTER — Other Ambulatory Visit: Payer: Self-pay | Admitting: Internal Medicine

## 2015-12-15 MED ORDER — METOPROLOL SUCCINATE ER 25 MG PO TB24: 25.0000 mg | ORAL_TABLET | Freq: Every day | ORAL | 3 refills | Status: DC

## 2015-12-15 NOTE — Progress Notes (Signed)
Received message from pharmacy that his tenormin and atenolol are both on national back order.  They suggested metoprolol succinate instead.  Order placed and response sent to [facility].

## 2015-12-16 ENCOUNTER — Telehealth: Payer: Self-pay | Admitting: *Deleted

## 2015-12-16 MED ORDER — METOPROLOL SUCCINATE ER 25 MG PO TB24: 25.0000 mg | ORAL_TABLET | Freq: Every day | ORAL | 3 refills | Status: DC

## 2015-12-16 NOTE — Telephone Encounter (Signed)
[facility] that Atenolol is on national back order and needs an alternative. Per Dr. Renato Gailseed Metoprolol succinate 25mg  po daily #[id number]. Medication list updated and Rx faxed.

## 2015-12-20 MED ORDER — METOPROLOL SUCCINATE ER 50 MG PO TB24: 50.0000 mg | ORAL_TABLET | Freq: Every day | ORAL | 0 refills | Status: DC

## 2015-12-20 NOTE — Telephone Encounter (Addendum)
Wife called back and stated patient's Bp is still creeping up. It is in the 200's. Patient wife does not understand why he went from a 100mg  to 25mg . Wants something done today stated she trusts her doctor not to let something happen to her husband. I told her that if it was urgent she needed to take him to the Urgent Care and she refused stating she does not drive and was not going to pay ambulance bill.  Please Advise.

## 2015-12-20 NOTE — Telephone Encounter (Signed)
Wife called and stated that since patient's Bp medications had to be changed to Metoprolol 25mg  his BP has been running high with the top number being 175. Wife is concerned.

## 2015-12-20 NOTE — Telephone Encounter (Signed)
Per Dr. Val Rileseed---Increase Metoprolol to 50mg  Patient wife notified and agreed. Medication list updated.

## 2015-12-20 NOTE — Addendum Note (Signed)
Addended by: Nelda SevereMAY, ANITA A on: 12/20/2015 04:30 PM   Modules accepted: Orders

## 2015-12-23 MED ORDER — METOPROLOL SUCCINATE ER 25 MG PO TB24: ORAL_TABLET | ORAL | 3 refills | Status: DC

## 2015-12-23 NOTE — Telephone Encounter (Signed)
Patient wife called today and stated that patient's blood pressure is still running high top number between 170-180. Wife is concerned. Is taking it 1 hours after taking medication. Wife stated that first thing in the morning when he gets up before any medication it is 140. Wife made an appointment for Monday at 1:15 for patient to be seen. Please Advise.

## 2015-12-23 NOTE — Telephone Encounter (Signed)
Increase metoprolol to 75mg  daily until I see him Monday.  If he develops chest pain, headache, difficulty with speech, weakness, or facial droop, he should proceed to the ED immediately.  Of course, he should not be eating foods high in sodium which will worsen his blood pressure and he should be taking all of the medications ordered for him, not just some of them.

## 2015-12-23 NOTE — Addendum Note (Signed)
Addended by: Nelda SevereMAY, Tyrick Dunagan A on: 12/23/2015 04:47 PM   Modules accepted: Orders

## 2015-12-23 NOTE — Telephone Encounter (Signed)
Patient wife notified and agreed.  

## 2015-12-26 ENCOUNTER — Encounter: Payer: Self-pay | Admitting: Internal Medicine

## 2015-12-26 ENCOUNTER — Ambulatory Visit (INDEPENDENT_AMBULATORY_CARE_PROVIDER_SITE_OTHER): Payer: Medicare Other | Admitting: Internal Medicine

## 2015-12-26 VITALS — BP 158/70 | HR 66 | Temp 98.2°F | Wt 169.0 lb

## 2015-12-26 DIAGNOSIS — I10 Essential (primary) hypertension: Secondary | ICD-10-CM | POA: Diagnosis not present

## 2015-12-26 DIAGNOSIS — Z23 Encounter for immunization: Secondary | ICD-10-CM | POA: Diagnosis not present

## 2015-12-26 DIAGNOSIS — N182 Chronic kidney disease, stage 2 (mild): Secondary | ICD-10-CM

## 2015-12-26 DIAGNOSIS — N401 Enlarged prostate with lower urinary tract symptoms: Secondary | ICD-10-CM

## 2015-12-26 DIAGNOSIS — E0822 Diabetes mellitus due to underlying condition with diabetic chronic kidney disease: Secondary | ICD-10-CM

## 2015-12-26 DIAGNOSIS — N138 Other obstructive and reflux uropathy: Secondary | ICD-10-CM

## 2015-12-26 MED ORDER — METOPROLOL SUCCINATE ER 100 MG PO TB24: 100.0000 mg | ORAL_TABLET | Freq: Every day | ORAL | 5 refills | Status: DC

## 2015-12-26 NOTE — Addendum Note (Signed)
Addended by: Sueanne MargaritaSMITH, Sansa Alkema L on: 12/26/2015 02:16 PM   Modules accepted: Orders

## 2015-12-26 NOTE — Patient Instructions (Signed)
Increase metoprolol succinate to 100mg  po daily Continue to check bp daily one hour after meds If over 150/90, please call back and we'll adjust the lisinopril

## 2015-12-26 NOTE — Progress Notes (Signed)
Location:  [facility] clinic Provider: Gretchen Weinfeld L. Renato Gails, D.O., C.M.D.  Code Status: full code Goals of Care:  Advanced Directives 12/26/2015  Does patient have an advance directive? No  Would patient like information on creating an advanced directive? No - patient declined information   Chief Complaint  Patient presents with  . Acute Visit    high blood pressure    HPI: Patient is a 75 y.o. male seen today for an acute visit for hypertension.  He is to be taking toprol xl 75mg  daily, lisinopril 5mg  po daily, lasix and flomax.  Today 158/70.  This all began b/c his previous bp medication was unavailable and I had to change it to the metoprolol.  Unfortunately, pharmacy did not recommend a dosage equivalent, just a med.  Now bp is high and it's taking a bit to adjust.    He is taking the flomax, but now having some bilateral pressure/pain in his groin.  Hard to urinate, no dysuria.  No significant nocturia.    He agrees to the flu shot.  Past Medical History:  Diagnosis Date  . Allergy, unspecified not elsewhere classified   . Chronic kidney disease, stage III (moderate)   . External hemorrhoids without mention of complication   . Gout, unspecified   . Hypertrophy of prostate without urinary obstruction and other lower urinary tract symptoms (LUTS)   . Obesity, unspecified   . Organic sleep apnea, unspecified   . Osteoarthrosis, unspecified whether generalized or localized, unspecified site   . Other abnormal glucose   . Other and unspecified hyperlipidemia   . Other atopic dermatitis and related conditions   . Other specified visual disturbances   . Pain in joint, lower leg   . Pain in limb   . Unspecified disorder of kidney and ureter   . Unspecified essential hypertension   . Urticaria, unspecified     Past Surgical History:  Procedure Laterality Date  . CATARACT EXTRACTION      No Known Allergies    Medication List       Accurate as of 12/26/15  1:34 PM. Always use  your most recent med list.          acetaminophen 500 MG tablet Commonly known as:  TYLENOL Take 500 mg by mouth every 6 (six) hours as needed for pain. Take 1-2 tablets by mouth every 6-8 hours as needed for pain.   albuterol (2.5 MG/3ML) 0.083% nebulizer solution Commonly known as:  PROVENTIL Take 3 mLs (2.5 mg total) by nebulization every 6 (six) hours as needed for wheezing or shortness of breath.   allopurinol 100 MG tablet Commonly known as:  ZYLOPRIM Take 1 tablet (100 mg total) by mouth daily. For gout   atorvastatin 40 MG tablet Commonly known as:  LIPITOR Take 1 tablet (40 mg total) by mouth daily.   furosemide 20 MG tablet Commonly known as:  LASIX Take 1 tablet (20 mg total) by mouth daily. For swelling of legs   gabapentin 300 MG capsule Commonly known as:  NEURONTIN Take 1 capsule (300 mg total) by mouth 3 (three) times daily.   glucose blood test strip Use as instructed   lisinopril 5 MG tablet Commonly known as:  PRINIVIL,ZESTRIL Take 1 tablet (5 mg total) by mouth daily. For blood pressure and diabetes   metoprolol succinate 25 MG 24 hr tablet Commonly known as:  TOPROL-XL Take 75mg  by mouth daily for blood pressure   onetouch ultrasoft lancets Use as instructed  tamsulosin 0.4 MG Caps capsule Commonly known as:  FLOMAX TAKE 1 CAPSULE BY MOUTH ONCE A DAY for your prostate   vitamin B-12 1000 MCG tablet Commonly known as:  CYANOCOBALAMIN Take 1,000 mcg by mouth daily.   Vitamin D3 2000 units Tabs Take by mouth daily.       Review of Systems:  Review of Systems  Constitutional: Negative for chills and fever.  HENT: Negative for hearing loss.   Eyes: Negative for blurred vision.  Respiratory: Negative for shortness of breath.   Cardiovascular: Negative for chest pain, palpitations and leg swelling.  Gastrointestinal: Negative for abdominal pain, blood in stool, constipation and melena.  Genitourinary: Negative for dysuria and urgency.        Difficulty starting stream for years, now has some pain during it and his wife wants him to see urology  Musculoskeletal: Positive for joint pain. Negative for falls.  Neurological: Negative for dizziness and focal weakness.  Endo/Heme/Allergies: Does not bruise/bleed easily.  Psychiatric/Behavioral: Negative for depression and memory loss.    Health Maintenance  Topic Date Due  . FOOT EXAM  07/09/1950  . OPHTHALMOLOGY EXAM  09/14/2015  . INFLUENZA VACCINE  11/29/2015  . HEMOGLOBIN A1C  01/20/2016  . TETANUS/TDAP  04/30/2018  . ZOSTAVAX  Completed  . PNA vac Low Risk Adult  Completed    Physical Exam: Vitals:   12/26/15 1328  BP: (!) 158/70  Pulse: 66  Temp: 98.2 F (36.8 C)  TempSrc: Oral  SpO2: 90%  Weight: 169 lb (76.7 kg)   Body mass index is 28.12 kg/m. Physical Exam  Constitutional: He is oriented to person, place, and time. He appears well-developed and well-nourished. No distress.  Cardiovascular: Normal rate, regular rhythm, normal heart sounds and intact distal pulses.   Pulmonary/Chest: Effort normal and breath sounds normal. He has no wheezes.  Abdominal: Soft. Bowel sounds are normal. He exhibits no distension. There is no tenderness.  Bilateral inguinal pain  Musculoskeletal: He exhibits edema and tenderness.  Right knee  Neurological: He is alert and oriented to person, place, and time.  Skin: Skin is warm and dry.  Psychiatric: He has a normal mood and affect.    Labs reviewed: Basic Metabolic Panel:  Recent Labs  16/10/96 1109 07/20/15 0947  NA 140 137  K 4.7 4.3  CL 105 102  CO2 22 20  GLUCOSE 91 83  BUN 20 16  CREATININE 1.58* 1.54*  CALCIUM 8.9 8.9   Liver Function Tests: No results for input(s): AST, ALT, ALKPHOS, BILITOT, PROT, ALBUMIN in the last 8760 hours. No results for input(s): LIPASE, AMYLASE in the last 8760 hours. No results for input(s): AMMONIA in the last 8760 hours. CBC:  Recent Labs  07/20/15 0947  WBC 8.4    NEUTROABS 4.6  HCT 44.2  MCV 75*  PLT 208   Lipid Panel:  Recent Labs  03/22/15 0959 07/20/15 0947  CHOL 144 167  HDL 29* 37*  LDLCALC 96 111*  TRIG 94 94  CHOLHDL 5.0 4.5   Lab Results  Component Value Date   HGBA1C 6.6 (H) 07/20/2015    Assessment/Plan 1. Essential hypertension, benign - bp still elevated today after med change due to supply being unavailable nationally - increase metoprolol succinate to 100mg  po daily  -if still over 150/90, increase lisinopril 5mg  to 20mg  at that time - Hemoglobin A1c; Future - CBC with Differential/Platelet; Future - Comprehensive metabolic panel; Future  2. BPH with obstruction/lower urinary tract symptoms - pt  has had difficulty starting his stream for a long time, has been on flomax - last PSA last year in 3 range -had refused to see urologist before, but now willing after his wife encouraged him - Ambulatory referral to Urology - PSA; Future (to come tomorrow for labs)  3. Need for prophylactic vaccination and inoculation against influenza -flu shot  4. 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 Hemoglobin A1c    Standing Status:   Future    Standing Expiration Date:   03/27/2016  . CBC with Differential/Platelet    Standing Status:   Future    Standing Expiration Date:   03/27/2016  . Comprehensive metabolic panel    Standing Status:   Future    Standing Expiration Date:   03/27/2016  . PSA    Standing Status:   Future    Standing Expiration Date:   03/27/2016  . Ambulatory referral to Urology    Referral Priority:   Routine    Referral Type:   Consultation    Referral Reason:   Specialty Services Required    Requested Specialty:   Urology    Number of Visits Requested:   1  flu shot  Next  appt:  3 mos med mgt, but come tomorrow for labs  Stanley Sullivan, D.O. Geriatrics [facility] [address] [place], [place] [address] Cell Phone (Mon-Fri 8am-5pm):  [phone] On Call:  [phone] & follow prompts after 5pm & weekends Office Phone:  [phone] Office Fax:  [phone]

## 2015-12-27 ENCOUNTER — Other Ambulatory Visit: Payer: Medicare Other

## 2015-12-27 DIAGNOSIS — N182 Chronic kidney disease, stage 2 (mild): Secondary | ICD-10-CM | POA: Diagnosis not present

## 2015-12-27 DIAGNOSIS — N401 Enlarged prostate with lower urinary tract symptoms: Secondary | ICD-10-CM | POA: Diagnosis not present

## 2015-12-27 DIAGNOSIS — E0822 Diabetes mellitus due to underlying condition with diabetic chronic kidney disease: Secondary | ICD-10-CM

## 2015-12-27 DIAGNOSIS — N138 Other obstructive and reflux uropathy: Secondary | ICD-10-CM

## 2015-12-27 DIAGNOSIS — I10 Essential (primary) hypertension: Secondary | ICD-10-CM

## 2015-12-27 LAB — CBC WITH DIFFERENTIAL/PLATELET
Basophils Absolute: 0 cells/uL (ref 0–200)
Basophils Relative: 0 %
Eosinophils Absolute: 468 cells/uL (ref 15–500)
Eosinophils Relative: 6 %
HCT: 44.6 % (ref 38.5–50.0)
Hemoglobin: 14 g/dL (ref 13.2–17.1)
Lymphocytes Relative: 21 %
Lymphs Abs: 1638 cells/uL (ref 850–3900)
MCH: 23.5 pg — ABNORMAL LOW (ref 27.0–33.0)
MCHC: 31.4 g/dL — ABNORMAL LOW (ref 32.0–36.0)
MCV: 74.7 fL — ABNORMAL LOW (ref 80.0–100.0)
Monocytes Absolute: 780 cells/uL (ref 200–950)
Monocytes Relative: 10 %
Neutro Abs: 4914 cells/uL (ref 1500–7800)
Neutrophils Relative %: 63 %
Platelets: 208 10*3/uL (ref 140–400)
RBC: 5.97 MIL/uL — ABNORMAL HIGH (ref 4.20–5.80)
RDW: 16.9 % — ABNORMAL HIGH (ref 11.0–15.0)
WBC: 7.8 10*3/uL (ref 3.8–10.8)

## 2015-12-27 LAB — COMPREHENSIVE METABOLIC PANEL
ALT: 10 U/L (ref 9–46)
AST: 16 U/L (ref 10–35)
Albumin: 3.5 g/dL — ABNORMAL LOW (ref 3.6–5.1)
Alkaline Phosphatase: 59 U/L (ref 40–115)
BUN: 19 mg/dL (ref 7–25)
CO2: 23 mmol/L (ref 20–31)
Calcium: 8.6 mg/dL (ref 8.6–10.3)
Chloride: 110 mmol/L (ref 98–110)
Creat: 1.55 mg/dL — ABNORMAL HIGH (ref 0.70–1.18)
Glucose, Bld: 104 mg/dL — ABNORMAL HIGH (ref 65–99)
Potassium: 4.5 mmol/L (ref 3.5–5.3)
Sodium: 139 mmol/L (ref 135–146)
Total Bilirubin: 1 mg/dL (ref 0.2–1.2)
Total Protein: 5.9 g/dL — ABNORMAL LOW (ref 6.1–8.1)

## 2015-12-27 LAB — HEMOGLOBIN A1C
Hgb A1c MFr Bld: 6.4 % — ABNORMAL HIGH (ref <5.7)
Mean Plasma Glucose: 137 mg/dL

## 2015-12-28 LAB — PSA: PSA: 2.2 ng/mL (ref <=4.0)

## 2015-12-29 ENCOUNTER — Encounter: Payer: Self-pay | Admitting: *Deleted

## 2016-01-06 ENCOUNTER — Telehealth: Payer: Self-pay | Admitting: *Deleted

## 2016-01-06 MED ORDER — LISINOPRIL 10 MG PO TABS: 10.0000 mg | ORAL_TABLET | Freq: Every day | ORAL | 3 refills | Status: DC

## 2016-01-06 NOTE — Telephone Encounter (Signed)
Patient wife called and stated that patient's blood pressure is running 190-200/90 has been this high for 4-5 days. Taking medications as directed. Dr. Renato Gailseed told her that if it stayed high she would increase the Lisinopril to 10mg . She also wants to know if he could take the Metoprolol 50mg  in the morning and 50mg  in the evening.  Please Advise.

## 2016-01-06 NOTE — Telephone Encounter (Signed)
Pt should be taking metoprolol succinate 100 mg daily and okay to increase lisinopril to 10 mg daily Make sure they are taking the blood pressure 30 - 60 mins after he has taken his medication after he has been sitting ~5 min. To notify if this has not improved blood pressure

## 2016-01-06 NOTE — Telephone Encounter (Signed)
Patient wife notified and agreed. Medication list updated and Rx faxed to [facility].

## 2016-01-08 DIAGNOSIS — J8410 Pulmonary fibrosis, unspecified: Secondary | ICD-10-CM | POA: Diagnosis not present

## 2016-01-24 DIAGNOSIS — N183 Chronic kidney disease, stage 3 (moderate): Secondary | ICD-10-CM | POA: Diagnosis not present

## 2016-02-02 ENCOUNTER — Encounter (INDEPENDENT_AMBULATORY_CARE_PROVIDER_SITE_OTHER): Payer: Self-pay

## 2016-02-02 ENCOUNTER — Ambulatory Visit (INDEPENDENT_AMBULATORY_CARE_PROVIDER_SITE_OTHER): Payer: Medicare Other | Admitting: Orthopaedic Surgery

## 2016-02-02 DIAGNOSIS — M25551 Pain in right hip: Secondary | ICD-10-CM

## 2016-02-02 DIAGNOSIS — M25561 Pain in right knee: Secondary | ICD-10-CM | POA: Diagnosis not present

## 2016-02-06 ENCOUNTER — Other Ambulatory Visit (INDEPENDENT_AMBULATORY_CARE_PROVIDER_SITE_OTHER): Payer: Self-pay | Admitting: Orthopaedic Surgery

## 2016-02-06 DIAGNOSIS — M25561 Pain in right knee: Secondary | ICD-10-CM

## 2016-02-07 DIAGNOSIS — J8410 Pulmonary fibrosis, unspecified: Secondary | ICD-10-CM | POA: Diagnosis not present

## 2016-02-16 ENCOUNTER — Ambulatory Visit
Admission: RE | Admit: 2016-02-16 | Discharge: 2016-02-16 | Disposition: A | Discharge status: Home / Self Care | Payer: Medicare Other | Source: Ambulatory Visit | Attending: Orthopaedic Surgery | Admitting: Orthopaedic Surgery

## 2016-02-16 DIAGNOSIS — M25561 Pain in right knee: Secondary | ICD-10-CM

## 2016-03-01 ENCOUNTER — Ambulatory Visit (INDEPENDENT_AMBULATORY_CARE_PROVIDER_SITE_OTHER): Payer: Medicare Other | Admitting: Orthopedic Surgery

## 2016-03-01 VITALS — BP 153/82 | HR 67 | Resp 12 | Ht 65.0 in | Wt 174.0 lb

## 2016-03-01 DIAGNOSIS — M1711 Unilateral primary osteoarthritis, right knee: Secondary | ICD-10-CM | POA: Diagnosis not present

## 2016-03-01 DIAGNOSIS — M23203 Derangement of unspecified medial meniscus due to old tear or injury, right knee: Secondary | ICD-10-CM | POA: Diagnosis not present

## 2016-03-01 NOTE — Progress Notes (Signed)
Office Visit Note   Patient: Stanley Sullivan           Date of Birth: April 20, 1941           MRN: [id number] Visit Date: 03/01/2016              Requested by: Kermit Baloiffany L Reed, DO [address] [place], [place] [address] PCP: Bufford SpikesEED, TIFFANY, DO  PtAssessment & Plan: Visit Diagnoses:  1. Old complex tear of medial meniscus of right knee   2. Primary osteoarthritis of right knee     Plan: At this time because of his age as well as his MRI results I feel that an arthroscopic debridement would not be of benefit for him. Therefore consideration of a total knee replacement for the right knee has been discussed. We will obtain medical clearance is for him. Once that is done and he needs to follow back up with Dr. Cleophas DunkerWhitfield.  Follow-Up Instructions: Return if symptoms worsen or fail to improve.   Orders:  No orders of the defined types were placed in this encounter.  No orders of the defined types were placed in this encounter.     Procedures: No procedures performed   Clinical Data: No additional findings.   Subjective: Painful right knee  Pt Right Knee pain is worsening, difficulty walking. BP to go over MRI results today. No meds, OTC doesn't work  Mr. Stanley Sullivan is a 75 year old male who is seen today for review of an MRI scan of his right knee. He states that he has been having problems with the knee for the last several months. He did have some worsening symptoms since his original visit back in July 2016. He has had a history of gout but is presently on allopurinol and occasional Colcrys. He denied really any recent history of a gouty attack. Treated by Dr. Corliss Skainseveshwar and has had good results initially with the corticosteroid injections. However now his symptoms have been worsening. The corticosteroid injections were not beneficial in a more. Therefore an MRI scan was ordered he is here today for review of his MRI scan.    Review of Systems  Constitutional: Negative.   HENT:  Negative.   Eyes: Negative.   Respiratory: Negative.   Cardiovascular:       Hypertension  Gastrointestinal: Negative.   Endocrine: Negative.   Genitourinary: Negative.   Musculoskeletal:       Gout  Skin: Negative.   Allergic/Immunologic: Negative.   Neurological: Negative.   Hematological: Negative.   Psychiatric/Behavioral: Negative.      Objective: Vital Signs: BP (!) 153/82 (BP Location: Left Arm)   Pulse 67   Resp 12   Ht 5\' 5"  (1.651 m)   Wt 174 lb (78.9 kg)   BMI 28.96 kg/m   Physical Exam  Constitutional: He is oriented to person, place, and time. He appears well-developed and well-nourished.  HENT:  Head: Normocephalic and atraumatic.  Eyes: EOM are normal. Pupils are equal, round, and reactive to light.  Neck:  No carotid bruits  Cardiovascular: Normal rate.   Pulmonary/Chest: Effort normal.  Musculoskeletal:       Right knee: He exhibits effusion.  Neurological: He is alert and oriented to person, place, and time.  Skin: Skin is warm and dry.  Psychiatric: He has a normal mood and affect. His behavior is normal. Judgment and thought content normal.    Right Knee Exam   Tenderness  The patient is experiencing tenderness in the medial joint  line and patella.  Range of Motion  Right knee extension: 5.  Right knee flexion: 100.   Muscle Strength   The patient has normal right knee strength.  Tests  Patellar Apprehension: positive  Other  Sensation: normal Pulse: present Swelling: mild Other tests: effusion present      Specialty Comments:  No specialty comments available.  Imaging: Mr Knee Right Wo Contrast  Result Date: 02/16/2016 CLINICAL DATA:  Chronic right knee pain X 2 years.  No injury. EXAM: MRI OF THE RIGHT KNEE WITHOUT CONTRAST TECHNIQUE: Multiplanar, multisequence MR imaging of the knee was performed. No intravenous contrast was administered. COMPARISON:  None. FINDINGS: MENISCI Medial meniscus: Radial tear of the  posterior horn of the medial meniscus adjacent to the meniscal root. Radial tear of the posterior horn- body of the medial meniscus. Lateral meniscus:  Intact. LIGAMENTS Cruciates: Intact ACL and PCL. Increased signal and expansion of the ACL as can be seen with mucinous degeneration. Collaterals:  Sir collaterals CARTILAGE Patellofemoral: Full-thickness cartilage loss of the lateral patellar facet. Medial: Full-thickness cartilage loss of the medial femorotibial compartment with subchondral reactive marrow changes. Lateral: Small focal area of cartilage fissuring involving the lateral femoral condyle. Joint: Large joint effusion. Normal Hoffa's fat. No plical thickening. Popliteal Fossa:  No Baker cyst.  Intact popliteus tendon. Extensor Mechanism:  Intact quadriceps tendon and patellar tendon. Bones: No other marrow signal abnormality. No fracture or dislocation. Other: No fluid collection or hematoma. IMPRESSION: 1. Radial tear of the posterior horn of the medial meniscus adjacent to the meniscal root. Radial tear of the posterior horn- body of the medial meniscus. 2. Tricompartmental cartilage abnormalities as described above. 3. Intact ACL.  Mucinous degeneration of the ACL. 4. Large joint effusion. Electronically Signed   By: Elige Ko   On: 02/16/2016 14:17   PMFS History: Patient Active Problem List   Diagnosis Date Noted  . Interstitial lung disease (HCC) 10/29/2014  . Primary osteoarthritis of right knee 10/29/2014  . DM (diabetes mellitus) type II controlled with renal manifestation (HCC) 10/29/2014  . OSA (obstructive sleep apnea) 10/09/2013  . ILD (interstitial lung disease) (HCC) 10/09/2013  . Cough 10/07/2013  . Dyspnea 10/07/2013  . Osteoarthritis of left knee 08/24/2013  . Benign prostatic hypertrophy 08/24/2013  . Essential hypertension, benign 08/24/2013  . Gout 08/24/2013  . Type II or unspecified type diabetes mellitus without mention of complication, not stated as  uncontrolled 08/24/2013  . Chronic idiopathic constipation 04/10/2013  . Dyspnea on exertion 04/10/2013  . Osteoarthrosis, unspecified whether generalized or localized, unspecified site   . Chronic kidney disease, stage III (moderate)   . Obesity, unspecified   . Gout, unspecified   . Other and unspecified hyperlipidemia   . Unspecified essential hypertension   . Hypertrophy of prostate without urinary obstruction and other lower urinary tract symptoms (LUTS)   . Maculopapular rash, generalized 10/02/2012   Past Medical History:  Diagnosis Date  . Allergy, unspecified not elsewhere classified   . Chronic kidney disease, stage III (moderate)   . External hemorrhoids without mention of complication   . Gout, unspecified   . Hypertrophy of prostate without urinary obstruction and other lower urinary tract symptoms (LUTS)   . Obesity, unspecified   . Organic sleep apnea, unspecified   . Osteoarthrosis, unspecified whether generalized or localized, unspecified site   . Other abnormal glucose   . Other and unspecified hyperlipidemia   . Other atopic dermatitis and related conditions   . Other specified visual  disturbances   . Pain in joint, lower leg   . Pain in limb   . Unspecified disorder of kidney and ureter   . Unspecified essential hypertension   . Urticaria, unspecified     No family history on file.  Past Surgical History:  Procedure Laterality Date  . CATARACT EXTRACTION     Social History   Occupational History  . retired    Social History Main Topics  . Smoking status: Former Smoker    Packs/day: 0.50    Years: 15.00    Types: Cigarettes    Quit date: 04/30/1993  . Smokeless tobacco: Former NeurosurgeonUser    Types: Chew  . Alcohol use Yes     Comment: occassional  . Drug use: No  . Sexual activity: Not on file

## 2016-03-05 ENCOUNTER — Telehealth (INDEPENDENT_AMBULATORY_CARE_PROVIDER_SITE_OTHER): Payer: Self-pay

## 2016-03-05 ENCOUNTER — Encounter (INDEPENDENT_AMBULATORY_CARE_PROVIDER_SITE_OTHER): Payer: Self-pay | Admitting: Orthopedic Surgery

## 2016-03-05 NOTE — Telephone Encounter (Signed)
Sent clearance forms 03/05/16

## 2016-03-09 DIAGNOSIS — J8410 Pulmonary fibrosis, unspecified: Secondary | ICD-10-CM | POA: Diagnosis not present

## 2016-03-12 ENCOUNTER — Telehealth: Payer: Self-pay | Admitting: Internal Medicine

## 2016-03-12 ENCOUNTER — Ambulatory Visit (INDEPENDENT_AMBULATORY_CARE_PROVIDER_SITE_OTHER): Payer: Medicare Other | Admitting: Internal Medicine

## 2016-03-12 ENCOUNTER — Encounter: Payer: Self-pay | Admitting: Internal Medicine

## 2016-03-12 VITALS — BP 158/80 | HR 68 | Temp 98.1°F | Ht 65.0 in | Wt 173.0 lb

## 2016-03-12 DIAGNOSIS — I5032 Chronic diastolic (congestive) heart failure: Secondary | ICD-10-CM | POA: Diagnosis not present

## 2016-03-12 DIAGNOSIS — N400 Enlarged prostate without lower urinary tract symptoms: Secondary | ICD-10-CM | POA: Diagnosis not present

## 2016-03-12 DIAGNOSIS — I10 Essential (primary) hypertension: Secondary | ICD-10-CM

## 2016-03-12 DIAGNOSIS — M1711 Unilateral primary osteoarthritis, right knee: Secondary | ICD-10-CM | POA: Diagnosis not present

## 2016-03-12 DIAGNOSIS — J8410 Pulmonary fibrosis, unspecified: Secondary | ICD-10-CM | POA: Diagnosis not present

## 2016-03-12 DIAGNOSIS — K4020 Bilateral inguinal hernia, without obstruction or gangrene, not specified as recurrent: Secondary | ICD-10-CM | POA: Diagnosis not present

## 2016-03-12 DIAGNOSIS — Z01818 Encounter for other preprocedural examination: Secondary | ICD-10-CM

## 2016-03-12 DIAGNOSIS — E0822 Diabetes mellitus due to underlying condition with diabetic chronic kidney disease: Secondary | ICD-10-CM

## 2016-03-12 DIAGNOSIS — N182 Chronic kidney disease, stage 2 (mild): Secondary | ICD-10-CM

## 2016-03-12 DIAGNOSIS — R3912 Poor urinary stream: Secondary | ICD-10-CM | POA: Diagnosis not present

## 2016-03-12 NOTE — Progress Notes (Signed)
[facility] Provider:  Benjimin Hadden L. Renato Gailseed, D.O., C.M.D.  Code Status: full code Goals of Care:  Advanced Directives 12/26/2015  Does patient have an advance directive? No  Would patient like information on creating an advanced directive? No - patient declined information   Chief Complaint  Patient presents with  . Medical Clearance    pre-op for knee surgery    HPI: Patient is a 75 y.o. male seen today for preop clearance for knee surgery.  He has a h/o DMII with last hba1c 6.4, hyperlipidemia, OSA, obesity, interstitial lung disease with hypoxia (noncompliant with oxygen therapy--came in today with sats 88% at rest), gout, environmental allergies, htn, CKDIII, BPH and chronic constipation.  He is here with his wife who helps interpret.  He follows with Dr. Marchelle Gearingamaswamy with pulmonary and has last has cardiac stress testing (myocardial perfusion imaging) in 05/11/13 by Dr. Jacinto HalimGanji, his cardiologist. His EF was estimated at 58% at that time.  He is using his oxygen at home and at night, but often does not use it when he goes out or for the full night.  BP elevated today here and at urologist.  He claims he has taken his medication.  He is not always honest with me about following medical advice, however.    I explained that there are increased risks in his case due to his restrictive lung disease when he has surgery--use of ventilator which he could get stuck on the machine.  He and his wife did not even realize he'd be asleep and on a machine for the knee replacement.  They have not thought about this and looked at me like I was morbid when I brought it up.  It appears he did not f/u with Dr. Marchelle Gearingamaswamy for his PFTs last year and is way overdue for an appt.  I explained that I don't feel comfortable with him having surgery from the pulmonary perspective without a specialist's input.  I also sent the preop clearance form to his cardiologist, Dr. Jacinto HalimGanji and they will schedule him an appt there if  they feel it is necessary.  His EKG here showed flattening of T waves vs. His 2014 EKG, but I know he's had others since then with Dr. Jacinto HalimGanji when his exercise stress test was abnormal.    He reports no symptoms, but was hypoxic walking in today.  Denies chest pain, sob, congestion, palpitations.  Reports right knee pain.  Diabetes control has improved.   Past Medical History:  Diagnosis Date  . Allergy, unspecified not elsewhere classified   . Chronic kidney disease, stage III (moderate)   . External hemorrhoids without mention of complication   . Gout, unspecified   . Hypertrophy of prostate without urinary obstruction and other lower urinary tract symptoms (LUTS)   . Obesity, unspecified   . Organic sleep apnea, unspecified   . Osteoarthrosis, unspecified whether generalized or localized, unspecified site   . Other abnormal glucose   . Other and unspecified hyperlipidemia   . Other atopic dermatitis and related conditions   . Other specified visual disturbances   . Pain in joint, lower leg   . Pain in limb   . Unspecified disorder of kidney and ureter   . Unspecified essential hypertension   . Urticaria, unspecified     Past Surgical History:  Procedure Laterality Date  . CATARACT EXTRACTION      No Known Allergies    Medication List       Accurate  as of 03/12/16  3:16 PM. Always use your most recent med list.          acetaminophen 500 MG tablet Commonly known as:  TYLENOL Take 500 mg by mouth every 6 (six) hours as needed for pain. Take 1-2 tablets by mouth every 6-8 hours as needed for pain.   allopurinol 100 MG tablet Commonly known as:  ZYLOPRIM Take 1 tablet (100 mg total) by mouth daily. For gout   atorvastatin 40 MG tablet Commonly known as:  LIPITOR Take 1 tablet (40 mg total) by mouth daily.   furosemide 20 MG tablet Commonly known as:  LASIX Take 1 tablet (20 mg total) by mouth daily. For swelling of legs   glucose blood test strip Use as  instructed   lisinopril 10 MG tablet Commonly known as:  PRINIVIL,ZESTRIL Take 1 tablet (10 mg total) by mouth daily. To control blood pressure   metoprolol succinate 100 MG 24 hr tablet Commonly known as:  TOPROL-XL Take 1 tablet (100 mg total) by mouth daily.   onetouch ultrasoft lancets Use as instructed   tamsulosin 0.4 MG Caps capsule Commonly known as:  FLOMAX TAKE 1 CAPSULE BY MOUTH ONCE A DAY for your prostate   vitamin B-12 1000 MCG tablet Commonly known as:  CYANOCOBALAMIN Take 1,000 mcg by mouth daily.   Vitamin D3 2000 units Tabs Take by mouth daily.       Review of Systems:  Review of Systems  Constitutional: Negative for chills, fever and malaise/fatigue.  HENT: Positive for hearing loss.   Eyes: Negative for blurred vision.  Respiratory: Negative for cough, sputum production, shortness of breath and wheezing.   Cardiovascular: Negative for chest pain, palpitations, orthopnea, leg swelling and PND.  Gastrointestinal: Negative for abdominal pain, blood in stool, constipation and melena.  Genitourinary: Positive for frequency. Negative for dysuria and urgency.  Musculoskeletal: Positive for joint pain. Negative for falls and myalgias.  Skin: Negative for itching and rash.  Neurological: Positive for tingling and sensory change. Negative for dizziness, loss of consciousness and weakness.  Endo/Heme/Allergies: Positive for environmental allergies.  Psychiatric/Behavioral: Negative for depression and memory loss.    Health Maintenance  Topic Date Due  . FOOT EXAM  07/09/1950  . OPHTHALMOLOGY EXAM  09/14/2015  . HEMOGLOBIN A1C  06/27/2016  . TETANUS/TDAP  04/30/2018  . INFLUENZA VACCINE  Completed  . ZOSTAVAX  Completed  . PNA vac Low Risk Adult  Completed    Physical Exam: Vitals:   03/12/16 1446  BP: (!) 158/80  Pulse: 68  Temp: 98.1 F (36.7 C)  TempSrc: Oral  SpO2: 90%  Weight: 173 lb (78.5 kg)  Height: 5\' 5"  (1.651 m)   Body mass index is  28.79 kg/m. Physical Exam  Constitutional: He is oriented to person, place, and time. He appears well-developed and well-nourished. No distress.  Did require O2 when POX 88 resting  Cardiovascular: Normal rate, regular rhythm, normal heart sounds and intact distal pulses.   Pulmonary/Chest: Effort normal and breath sounds normal. No respiratory distress. He has no wheezes. He has no rales. He exhibits no tenderness.  Abdominal: Soft. Bowel sounds are normal.  Musculoskeletal: Normal range of motion. He exhibits tenderness.  Right knee  Neurological: He is alert and oriented to person, place, and time.  Skin: Skin is warm and dry.  Psychiatric: He has a normal mood and affect.    Labs reviewed: Basic Metabolic Panel:  Recent Labs  16/10/96 1109 07/20/15 0947 12/27/15 0454  NA 140 137 139  K 4.7 4.3 4.5  CL 105 102 110  CO2 22 20 23   GLUCOSE 91 83 104*  BUN 20 16 19   CREATININE 1.58* 1.54* 1.55*  CALCIUM 8.9 8.9 8.6   Liver Function Tests:  Recent Labs  12/27/15 0959  AST 16  ALT 10  ALKPHOS 59  BILITOT 1.0  PROT 5.9*  ALBUMIN 3.5*   No results for input(s): LIPASE, AMYLASE in the last 8760 hours. No results for input(s): AMMONIA in the last 8760 hours. CBC:  Recent Labs  07/20/15 0947 12/27/15 0959  WBC 8.4 7.8  NEUTROABS 4.6 4,914  HGB  --  14.0  HCT 44.2 44.6  MCV 75* 74.7*  PLT 208 208   Lipid Panel:  Recent Labs  03/22/15 0959 07/20/15 0947  CHOL 144 167  HDL 29* 37*  LDLCALC 96 111*  TRIG 94 94  CHOLHDL 5.0 4.5   Lab Results  Component Value Date   HGBA1C 6.4 (H) 12/27/2015    Procedures since last visit: Mr Knee Right Wo Contrast  Result Date: 02/16/2016 CLINICAL DATA:  Chronic right knee pain X 2 years.  No injury. EXAM: MRI OF THE RIGHT KNEE WITHOUT CONTRAST TECHNIQUE: Multiplanar, multisequence MR imaging of the knee was performed. No intravenous contrast was administered. COMPARISON:  None. FINDINGS: MENISCI Medial meniscus:  Radial tear of the posterior horn of the medial meniscus adjacent to the meniscal root. Radial tear of the posterior horn- body of the medial meniscus. Lateral meniscus:  Intact. LIGAMENTS Cruciates: Intact ACL and PCL. Increased signal and expansion of the ACL as can be seen with mucinous degeneration. Collaterals:  Sir collaterals CARTILAGE Patellofemoral: Full-thickness cartilage loss of the lateral patellar facet. Medial: Full-thickness cartilage loss of the medial femorotibial compartment with subchondral reactive marrow changes. Lateral: Small focal area of cartilage fissuring involving the lateral femoral condyle. Joint: Large joint effusion. Normal Hoffa's fat. No plical thickening. Popliteal Fossa:  No Baker cyst.  Intact popliteus tendon. Extensor Mechanism:  Intact quadriceps tendon and patellar tendon. Bones: No other marrow signal abnormality. No fracture or dislocation. Other: No fluid collection or hematoma. IMPRESSION: 1. Radial tear of the posterior horn of the medial meniscus adjacent to the meniscal root. Radial tear of the posterior horn- body of the medial meniscus. 2. Tricompartmental cartilage abnormalities as described above. 3. Intact ACL.  Mucinous degeneration of the ACL. 4. Large joint effusion. Electronically Signed   By: Elige Ko   On: 02/16/2016 14:17    Assessment/Plan 1. Pre-operative clearance - from the medical perspective (not pulmonary and cardiology), he is clear for surgery--he is high risk from the pulmonary perspective in my opinion, but his diabetes is now well controlled -he is noncompliant with therapies, meds, f/u visits also making him higher risk - EKG 12-Lead with some T wave flattening vs. 2014, but has seen Dr. Jacinto Halim since then  2. Essential hypertension -bp elevated today when hypoxic, claims he took meds--hard to know if he really did   3. Diabetes mellitus due to underlying condition with stage 2 chronic kidney disease, without long-term current  use of insulin (HCC) -well controlled on current therapy, has CKD and neuropathy  4. Interstitial pulmonary fibrosis (HCC) -with hypoxic respiratory failure and noncompliant with O2 for this and for his sleep apnea  5. Chronic diastolic CHF (congestive heart failure) (HCC) -noted on last echo with Dr. Jacinto Halim, stable on current therapy  6. Primary osteoarthritis of right knee -needs TKA, will need GA  I suspect and that will make him high risk with his lung disease (from my primary care geriatrician perspective)--need pulmonary input for sure--that office was going to call his wife to set up the appt  Labs/tests ordered:  Orders Placed This Encounter  Procedures  . EKG 12-Lead    Next appt:  03/29/2016   Delayla Hoffmaster L. Lashara Urey, D.O. Geriatrics [facility] [address] [place], [place] [address] Cell Phone (Mon-Fri 8am-5pm):  [phone] On Call:  [phone] & follow prompts after 5pm & weekends Office Phone:  [phone] Office Fax:  [phone]

## 2016-03-12 NOTE — Patient Instructions (Signed)
Biotene spray for dry mouth.

## 2016-03-12 NOTE — Telephone Encounter (Signed)
LMTCB

## 2016-03-13 NOTE — Telephone Encounter (Signed)
Spoke with the pt's spouse  Pt needs ov for clearance for TKR  I have scheduled appt  Nothing further needed

## 2016-03-13 NOTE — Telephone Encounter (Signed)
Pt returning wife returning call.Stanley GriffinsStanley A Dalton

## 2016-03-14 ENCOUNTER — Telehealth: Payer: Self-pay | Admitting: Internal Medicine

## 2016-03-14 DIAGNOSIS — R0602 Shortness of breath: Secondary | ICD-10-CM | POA: Diagnosis not present

## 2016-03-14 DIAGNOSIS — E785 Hyperlipidemia, unspecified: Secondary | ICD-10-CM | POA: Diagnosis not present

## 2016-03-14 DIAGNOSIS — Z01810 Encounter for preprocedural cardiovascular examination: Secondary | ICD-10-CM | POA: Diagnosis not present

## 2016-03-14 DIAGNOSIS — I2510 Atherosclerotic heart disease of native coronary artery without angina pectoris: Secondary | ICD-10-CM | POA: Diagnosis not present

## 2016-03-14 DIAGNOSIS — J849 Interstitial pulmonary disease, unspecified: Secondary | ICD-10-CM

## 2016-03-14 DIAGNOSIS — R0609 Other forms of dyspnea: Principal | ICD-10-CM

## 2016-03-14 NOTE — Telephone Encounter (Signed)
Dr Ganji calld -says he dessaturated with exertionJacinto Halim in office. Needs preop knee surgery clearance. Pleae get him in for   - next few weeks:  repeat HRCT and Pre-bd spiro and dlco only. No lung volume or bd response. No post-bd spiro   - to see me or TP as preop eval  Thanks  Dr. Kalman ShanMurali Ahijah Devery, M.D., [facility].P Pulmonary and Critical Care Medicine Staff Physician [facility] Pager: [phone], If no answer or between  15:00h - 7:00h: call [phone]  03/14/2016 8:13 PM

## 2016-03-15 NOTE — Telephone Encounter (Signed)
Called and spoke to pt's wife. Informed her of the recs per MR. HRCT and PFT will need to be within 3 weeks. Orders placed. Pt's wife verbalized understanding.   PCC's please advise when HRCT and PFT ( ) is so an OV can be made. Thanks.

## 2016-03-16 NOTE — Telephone Encounter (Signed)
Ct scheduled 03/19/16@[facility]  and pft on 03/20/16 Tobe SosSally E Ottinger

## 2016-03-19 ENCOUNTER — Ambulatory Visit (INDEPENDENT_AMBULATORY_CARE_PROVIDER_SITE_OTHER)
Admission: RE | Admit: 2016-03-19 | Discharge: 2016-03-19 | Disposition: A | Discharge status: Home / Self Care | Payer: Medicare Other | Source: Ambulatory Visit | Attending: Internal Medicine | Admitting: Internal Medicine

## 2016-03-19 DIAGNOSIS — J849 Interstitial pulmonary disease, unspecified: Secondary | ICD-10-CM | POA: Diagnosis not present

## 2016-03-19 DIAGNOSIS — R918 Other nonspecific abnormal finding of lung field: Secondary | ICD-10-CM | POA: Diagnosis not present

## 2016-03-19 DIAGNOSIS — R0609 Other forms of dyspnea: Secondary | ICD-10-CM

## 2016-03-20 ENCOUNTER — Ambulatory Visit (INDEPENDENT_AMBULATORY_CARE_PROVIDER_SITE_OTHER): Payer: Medicare Other | Admitting: Internal Medicine

## 2016-03-20 DIAGNOSIS — R0609 Other forms of dyspnea: Secondary | ICD-10-CM | POA: Diagnosis not present

## 2016-03-20 DIAGNOSIS — J849 Interstitial pulmonary disease, unspecified: Secondary | ICD-10-CM

## 2016-03-20 LAB — PULMONARY FUNCTION TEST
DL/VA % PRED: 71 %
DL/VA: 3.04 ml/min/mmHg/L
DLCO COR % PRED: 33 %
DLCO cor: 8.66 ml/min/mmHg
DLCO unc % pred: 32 %
DLCO unc: 8.32 ml/min/mmHg
FEF 25-75 PRE: 1.75 L/s
FEF2575-%PRED-PRE: 92 %
FEV1-%Pred-Pre: 78 %
FEV1-PRE: 2 L
FEV1FVC-%Pred-Pre: 103 %
FEV6-%PRED-PRE: 82 %
FEV6-Pre: 2.55 L
FEV6FVC-%Pred-Pre: 107 %
FVC-%PRED-PRE: 76 %
FVC-PRE: 2.55 L
Pre FEV1/FVC ratio: 78 %
Pre FEV6/FVC Ratio: 100 %

## 2016-03-21 ENCOUNTER — Inpatient Hospital Stay: Admission: RE | Admit: 2016-03-21 | Payer: Medicare Other | Source: Ambulatory Visit

## 2016-03-26 ENCOUNTER — Encounter: Payer: Self-pay | Admitting: Internal Medicine

## 2016-03-26 ENCOUNTER — Ambulatory Visit (INDEPENDENT_AMBULATORY_CARE_PROVIDER_SITE_OTHER): Payer: Medicare Other | Admitting: Internal Medicine

## 2016-03-26 VITALS — BP 134/82 | HR 73 | Temp 97.7°F | Ht 65.0 in | Wt 172.0 lb

## 2016-03-26 VITALS — BP 148/80 | HR 84 | Ht 65.0 in | Wt 173.4 lb

## 2016-03-26 DIAGNOSIS — B9789 Other viral agents as the cause of diseases classified elsewhere: Secondary | ICD-10-CM

## 2016-03-26 DIAGNOSIS — J069 Acute upper respiratory infection, unspecified: Secondary | ICD-10-CM | POA: Diagnosis not present

## 2016-03-26 DIAGNOSIS — J849 Interstitial pulmonary disease, unspecified: Secondary | ICD-10-CM

## 2016-03-26 DIAGNOSIS — Z01811 Encounter for preprocedural respiratory examination: Secondary | ICD-10-CM | POA: Diagnosis not present

## 2016-03-26 MED ORDER — PROMETHAZINE-CODEINE 6.25-10 MG/5ML PO SYRP: 5.0000 mL | ORAL_SOLUTION | Freq: Four times a day (QID) | ORAL | 0 refills | Status: DC | PRN

## 2016-03-26 NOTE — Progress Notes (Signed)
[facility]  [facility] Provider: Ankit Degregorio L. Renato Gailseed, D.O., C.M.D.  Code Status: FULL CODE Goals of Care:  Advanced Directives 03/26/2016  Does Patient Have a Medical Advance Directive? No  Would patient like information on creating a medical advance directive? -   Chief Complaint  Patient presents with  . Acute Visit    cough, sneezing for 10 days. Here with wife.     HPI: Patient is a 75 y.o. male seen today for an acute visit for cough and sneezing for more than 10 days.  He seems to get this every fall per his wife.  His cough is keeping them both up at night.  He is trying to get all of his testing done so he can have his knee replacement.  They demand cough syrup that will help him sleep at night.    Past Medical History:  Diagnosis Date  . Allergy, unspecified not elsewhere classified   . Chronic kidney disease, stage III (moderate)   . External hemorrhoids without mention of complication   . Gout, unspecified   . Hypertrophy of prostate without urinary obstruction and other lower urinary tract symptoms (LUTS)   . Obesity, unspecified   . Organic sleep apnea, unspecified   . Osteoarthrosis, unspecified whether generalized or localized, unspecified site   . Other abnormal glucose   . Other and unspecified hyperlipidemia   . Other atopic dermatitis and related conditions   . Other specified visual disturbances   . Pain in joint, lower leg   . Pain in limb   . Unspecified disorder of kidney and ureter   . Unspecified essential hypertension   . Urticaria, unspecified     Past Surgical History:  Procedure Laterality Date  . CATARACT EXTRACTION      No Known Allergies    Medication List       Accurate as of 03/26/16 12:54 PM. Always use your most recent med list.          acetaminophen 500 MG tablet Commonly known as:  TYLENOL Take 500 mg by mouth every 6 (six) hours as needed for pain. Take 1-2 tablets by mouth every 6-8 hours as needed for pain.     allopurinol 100 MG tablet Commonly known as:  ZYLOPRIM Take 1 tablet (100 mg total) by mouth daily. For gout   atorvastatin 40 MG tablet Commonly known as:  LIPITOR Take 1 tablet (40 mg total) by mouth daily.   furosemide 20 MG tablet Commonly known as:  LASIX Take 1 tablet (20 mg total) by mouth daily. For swelling of legs   glucose blood test strip Use as instructed   lisinopril 10 MG tablet Commonly known as:  PRINIVIL,ZESTRIL Take 1 tablet (10 mg total) by mouth daily. To control blood pressure   metoprolol succinate 100 MG 24 hr tablet Commonly known as:  TOPROL-XL Take 1 tablet (100 mg total) by mouth daily.   onetouch ultrasoft lancets Use as instructed   tamsulosin 0.4 MG Caps capsule Commonly known as:  FLOMAX TAKE 1 CAPSULE BY MOUTH ONCE A DAY for your prostate   vitamin B-12 1000 MCG tablet Commonly known as:  CYANOCOBALAMIN Take 1,000 mcg by mouth daily.   Vitamin D3 2000 units Tabs Take by mouth daily.       Review of Systems:  Review of Systems  Constitutional: Negative for chills and fever.       Did have fever by report last week  HENT: Positive for congestion. Negative for ear  pain, sinus pain and sore throat.   Respiratory: Positive for cough. Negative for sputum production, shortness of breath and wheezing.   Cardiovascular: Negative for chest pain and palpitations.  Gastrointestinal: Negative for abdominal pain.  Genitourinary: Negative for dysuria.  Musculoskeletal: Positive for joint pain. Negative for falls.  Neurological: Negative for dizziness.    Health Maintenance  Topic Date Due  . FOOT EXAM  07/09/1950  . OPHTHALMOLOGY EXAM  09/14/2015  . HEMOGLOBIN A1C  06/27/2016  . TETANUS/TDAP  04/30/2018  . INFLUENZA VACCINE  Completed  . ZOSTAVAX  Completed  . PNA vac Low Risk Adult  Completed    Physical Exam: Vitals:   03/26/16 1209  BP: 134/82  Pulse: 73  Temp: 97.7 F (36.5 C)  TempSrc: Oral  SpO2: 93%  Weight: 172 lb  (78 kg)  Height: 5\' 5"  (1.651 m)   Body mass index is 28.62 kg/m. Physical Exam  Constitutional: He is oriented to person, place, and time. He appears well-developed and well-nourished. No distress.  Cardiovascular: Normal rate, regular rhythm, normal heart sounds and intact distal pulses.   Pulmonary/Chest: Effort normal and breath sounds normal.  Actually wearing his oxygen--his wife complains the tank is too heavy for them--advised to discuss with pulmonary who orders his O2 and supplies  Abdominal: Bowel sounds are normal.  Musculoskeletal: Normal range of motion.  Neurological: He is alert and oriented to person, place, and time.    Labs reviewed: Basic Metabolic Panel:  Recent Labs  16/10/96 0947 12/27/15 0959  NA 137 139  K 4.3 4.5  CL 102 110  CO2 20 23  GLUCOSE 83 104*  BUN 16 19  CREATININE 1.54* 1.55*  CALCIUM 8.9 8.6   Liver Function Tests:  Recent Labs  12/27/15 0959  AST 16  ALT 10  ALKPHOS 59  BILITOT 1.0  PROT 5.9*  ALBUMIN 3.5*   No results for input(s): LIPASE, AMYLASE in the last 8760 hours. No results for input(s): AMMONIA in the last 8760 hours. CBC:  Recent Labs  07/20/15 0947 12/27/15 0959  WBC 8.4 7.8  NEUTROABS 4.6 4,914  HGB  --  14.0  HCT 44.2 44.6  MCV 75* 74.7*  PLT 208 208   Lipid Panel:  Recent Labs  07/20/15 0947  CHOL 167  HDL 37*  LDLCALC 111*  TRIG 94  CHOLHDL 4.5   Lab Results  Component Value Date   HGBA1C 6.4 (H) 12/27/2015    Procedures since last visit: Ct Chest High Resolution  Result Date: 03/19/2016 CLINICAL DATA:  Interstitial lung disease.  New productive cough. EXAM: CT CHEST WITHOUT CONTRAST TECHNIQUE: Multidetector CT imaging of the chest was performed following the standard protocol without intravenous contrast. High resolution imaging of the lungs, as well as inspiratory and expiratory imaging, was performed. COMPARISON:  02/10/2015 and 10/12/2013. FINDINGS: Cardiovascular: Atherosclerotic  calcification of the arterial vasculature, including coronary arteries. Heart is mildly enlarged. No pericardial effusion. Mediastinum/Nodes: Mediastinal lymph nodes measure up to 1.4 cm in the low right paratracheal station, similar. Hilar regions are difficult to definitively evaluate without IV contrast. No axillary adenopathy. Esophagus is grossly unremarkable. Lungs/Pleura: A few scattered pulmonary nodules measure 4 mm or less in size, unchanged and therefore considered benign. There may be minimal scattered subpleural reticular changes without progression. No traction bronchiectasis/bronchiolectasis, ground-glass, architectural distortion or honeycombing. No pleural fluid. Airway is unremarkable. Upper Abdomen: Visualized portions of the liver and gallbladder are unremarkable. Slight thickening of the right adrenal gland. Visualized portions of  the left adrenal gland, kidneys, spleen, pancreas and stomach are grossly unremarkable. Duodenal diverticulum is incidentally noted. No upper abdominal adenopathy. Musculoskeletal: No worrisome lytic or sclerotic lesions. Degenerative changes are seen in the spine. IMPRESSION: 1. Questionable scattered subpleural reticular changes, unchanged from 02/10/2015. Difficult to exclude nonspecific interstitial pneumonitis. 2. Scattered pulmonary nodules are unchanged and therefore considered benign. 3. Aortic atherosclerosis (ICD10-170.0). Coronary artery calcification. Electronically Signed   By: Leanna BattlesMelinda  Blietz M.D.   On: 03/19/2016 08:57    Assessment/Plan 1. Viral URI with cough -no signs of bacterial etiology -afebrile here -will prescribe Rx cough syrup for overnight at their request to help with sleep -no need for abx at this point  Labs/tests ordered:  No orders of the defined types were placed in this encounter.  Next appt: Visit date not found--as usual, follow up appts not made when leaving   Chrisma Hurlock L. Onesimo Lingard, D.O. Geriatrics [facility] [address] [place], [place] [address] Cell Phone (Mon-Fri 8am-5pm):  [phone] On Call:  [phone] & follow prompts after 5pm & weekends Office Phone:  [phone] Office Fax:  [phone]

## 2016-03-26 NOTE — Progress Notes (Signed)
Subjective:     Patient ID: Stanley Sullivan, male   DOB: 19-Apr-1941, 75 y.o.   MRN: [id number]  HPI     OV 03/26/2016  Chief Complaint  Patient presents with  . 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 Also has diastolic dysfunction based on echocardiogram in 2014 and a normal cardiac stress test in 2015  Last seen October 2016. At that time I wanted to restage his ILD. Given did not follow-up. He was already on oxygen with exertion. He is here with his wife Stanley Sullivan. Both of them report the I'll be stable. He spends most of the day walking around without much of dyspnea. He hardly realizes oxygen therapy although today when he walked in with 2 L oxygen he promptly desaturated to 85%. With rest bounced back up to 92%. He has desaturation he does not perceive the dyspnea and he does not use his oxygen compliantly. His wife wants a lighter oxygen system for him  His new issues that he wants a preoperative clearance for his right knee replacement. 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 I got a call from Dr. Marchelle Gearing his cardiologist within the last week or 2 requesting a preoperative pulmonary clearance stating that his cardiac status is stable     [name] Postperative Pulmonary Risk Score Comment Score  Type of surgery - abd ao aneurysm (27), thoracic (21), neurosurgery / upper abdominal / vascular (21), neck (11) Knee surger 0  Emergency Surgery - (11) elective 0  ALbumin < 3 or poor nutritional state - (9) Clinically normal 0  BUN > 30 -  (8) Clinically normal 0  Partial or completely dependent functional status - (7) fyunctiona 0  COPD -  (6) ILD 7  Age - 60 to 88 (4), > 70  (6) Age 81 6  TOTAL  12  Risk Stratifcation scores  - <  10, 11-19, 20-27, 28-40, >40  Low-moderate     CANET Postperative Pulmonary Risk Score Comment Score  Age - <50 (0), 50-80 (3), >80 (16) 75 years 3  Preoperative pulse ox - >96 (0), 91-95 (8), <90 (24) On 2L [place] 24  Respiratory infection in last month - Yes (17) none 0  Preoperative anemia - < 10gm% - Yes (11) 14g in august 0  Surgical incision - Upper abdominal (15), Thoracic (24) knee 0  Duration of surgery - <2h (0), 2-3h (16), >3h (23) 2h per family 0  Emergency Surgery - Yes (8) elective 0  TOTAL  27  Risk Stratification - Low (<26), Intermediate (26-44), High (>45)  intermediate     Results for Stanley Sullivan (MRN [id number]) as of 03/26/2016 16:07  Ref. Range 10/08/2013 09:35 03/20/2016 14:44  FVC-Pre Latest Units: L 2.27 2.55  FVC-%Pred-Pre Latest Units: % 65 76  FEV1-Pre Latest Units: L 1.82 2.00  FEV1-%Pred-Pre Latest Units: % 73 78   Results for Stanley Sullivan (MRN [id number]) as of 03/26/2016 16:07  Ref. Range 10/08/2013 09:35 03/20/2016 14:44  DLCO unc Latest Units: ml/min/mmHg 6.44 8.32  DLCO unc % pred Latest Units: % 25 32    COMPARISON:  02/10/2015 and 10/12/2013.  FINDINGS: Cardiovascular: Atherosclerotic calcification of the arterial vasculature, including coronary arteries. Heart is  mildly enlarged. No pericardial effusion.  Mediastinum/Nodes: Mediastinal lymph nodes measure up to 1.4 cm in the low right paratracheal station, similar. Hilar regions are difficult to definitively evaluate without IV contrast. No axillary adenopathy. Esophagus is grossly unremarkable.  Lungs/Pleura: A few scattered pulmonary nodules measure 4 mm or less in size, unchanged and therefore considered benign. There may be minimal scattered subpleural reticular changes without progression. No traction bronchiectasis/bronchiolectasis, ground-glass, architectural distortion or honeycombing. No pleural fluid. Airway is unremarkable.  Upper Abdomen: Visualized portions of  the liver and gallbladder are unremarkable. Slight thickening of the right adrenal gl  and. Visualized portions of the left adrenal gland, kidneys, spleen, pancreas and stomach are grossly unremarkable. Duodenal diverticulum is incidentally noted. No upper abdominal adenopathy.  Musculoskeletal: No worrisome lytic or sclerotic lesions. Degenerative changes are seen in the spine.  IMPRESSION: 1. Questionable scattered subpleural reticular changes, unchanged from 02/10/2015. Difficult to exclude nonspecific interstitial pneumonitis. 2. Scattered pulmonary nodules are unchanged and therefore considered benign. 3. Aortic atherosclerosis (ICD10-170.0). Coronary artery calcification.   Electronically Signed   By: Stanley BattlesMelinda  Sullivan M.D.   On: 03/19/2016 08:57   has a past medical history of Allergy, unspecified not elsewhere classified; Chronic kidney disease, stage III (moderate); External hemorrhoids without mention of complication; Gout, unspecified; Hypertrophy of prostate without urinary obstruction and other lower urinary tract symptoms (LUTS); Obesity, unspecified; Organic sleep apnea, unspecified; Osteoarthrosis, unspecified whether generalized or localized, unspecified site; Other abnormal glucose; Other and unspecified hyperlipidemia; Other atopic dermatitis and related conditions; Other specified visual disturbances; Pain in joint, lower leg; Pain in limb; Unspecified disorder of kidney and ureter; Unspecified essential hypertension; and Urticaria, unspecified.   reports that he quit smoking about 22 years ago. His smoking use included Cigarettes. He has a 7.50 pack-year smoking history. He has quit using smokeless tobacco. His smokeless tobacco use included Chew.  Past Surgical History:  Procedure Laterality Date  . CATARACT EXTRACTION      No Known Allergies  Immunization History  Administered Date(s) Administered  . Influenza Split 12/13/2014  . Influenza Whole  02/14/2007, 01/17/2013  . Influenza,inj,Quad PF,36+ Mos 01/29/2014, 12/26/2015  . Pneumococcal Conjugate-13 02/25/2014  . Pneumococcal Polysaccharide-23 02/14/2007  . Td 04/30/2008  . Zoster 09/10/2013    No family history on file.   Current Outpatient Prescriptions:  .  acetaminophen (TYLENOL) 500 MG tablet, Take 500 mg by mouth every 6 (six) hours as needed for pain. Take 1-2 tablets by mouth every 6-8 hours as needed for pain., Disp: , Rfl:  .  allopurinol (ZYLOPRIM) 100 MG tablet, Take 1 tablet (100 mg total) by mouth daily. For gout, Disp: 90 tablet, Rfl: 1 .  atorvastatin (LIPITOR) 40 MG tablet, Take 1 tablet (40 mg total) by mouth daily., Disp: 90 tablet, Rfl: 1 .  Cholecalciferol (VITAMIN D3) 2000 units TABS, Take by mouth daily., Disp: , Rfl:  .  furosemide (LASIX) 20 MG tablet, Take 1 tablet (20 mg total) by mouth daily. For swelling of legs, Disp: 90 tablet, Rfl: 1 .  glucose blood test strip, Use as instructed, Disp: 100 each, Rfl: 12 .  Lancets (ONETOUCH ULTRASOFT) lancets, Use as instructed, Disp: 100 each, Rfl: 12 .  lisinopril (PRINIVIL,ZESTRIL) 10 MG tablet, Take 1 tablet (10 mg total) by mouth daily. To control blood pressure, Disp: 90 tablet, Rfl: 3 .  metoprolol succinate (TOPROL-XL) 100 MG 24 hr tablet, Take 1 tablet (100 mg total) by mouth daily., Disp: 30 tablet, Rfl: 5 .  promethazine-codeine (PHENERGAN WITH  CODEINE) 6.25-10 MG/5ML syrup, Take 5 mLs by mouth every 6 (six) hours as needed for cough., Disp: 180 mL, Rfl: 0 .  tamsulosin (FLOMAX) 0.4 MG CAPS capsule, TAKE 1 CAPSULE BY MOUTH ONCE A DAY for your prostate, Disp: 90 capsule, Rfl: 1 .  vitamin B-12 (CYANOCOBALAMIN) 1000 MCG tablet, Take 1,000 mcg by mouth daily., Disp: , Rfl:    Review of Systems     Objective:   Physical Exam  Constitutional: He is oriented to person, place, and time. He appears well-developed and well-nourished. No distress.  HENT:  Head: Normocephalic and atraumatic.  Right Ear:  External ear normal.  Left Ear: External ear normal.  Mouth/Throat: Oropharynx is clear and moist. No oropharyngeal exudate.  o2 on  Eyes: Conjunctivae and EOM are normal. Pupils are equal, round, and reactive to light. Right eye exhibits no discharge. Left eye exhibits no discharge. No scleral icterus.  Neck: Normal range of motion. Neck supple. No JVD present. No tracheal deviation present. No thyromegaly present.  Cardiovascular: Normal rate, regular rhythm and intact distal pulses.  Exam reveals no gallop and no friction rub.   No murmur heard. Pulmonary/Chest: Effort normal and breath sounds normal. No respiratory distress. He has no wheezes. He has no rales. He exhibits no tenderness.  Faint crackles at base  Abdominal: Soft. Bowel sounds are normal. He exhibits no distension and no mass. There is no tenderness. There is no rebound and no guarding.  Musculoskeletal: Normal range of motion. He exhibits no edema or tenderness.  Lymphadenopathy:    He has no cervical adenopathy.  Neurological: He is alert and oriented to person, place, and time. He has normal reflexes. No cranial nerve deficit. Coordination normal.  Skin: Skin is warm and dry. No rash noted. He is not diaphoretic. No erythema. No pallor.  Psychiatric: He has a normal mood and affect. His behavior is normal. Judgment and thought content normal.  Nursing note and vitals reviewed.   Vitals:   03/26/16 1539 03/26/16 1541  BP: (!) 148/80   Pulse: 73 84  SpO2: 92% (!) 85%  Weight: 173 lb 6.4 oz (78.7 kg)   Height: 5\' 5"  (1.651 m)     Estimated body mass index is 28.86 kg/m as calculated from the following:   Height as of this encounter: 5\' 5"  (1.651 m).   Weight as of this encounter: 173 lb 6.4 oz (78.7 kg).      Assessment:       ICD-9-CM ICD-10-CM   1. Preoperative respiratory examination V72.82 Z01.811   2. ILD (interstitial lung disease) (HCC) 515 J84.9        Plan:      #ILD With exertional hypoxemia   - still present; unchanged thoracic radiologist is questioning the presence of this but he has significant desaturation. Some of this could be due to his diastolic dysfunction. Unclear if he has elevated pulmonary artery pressures but at least in 2014 he did not   Plan  - cause unknown - at some point we need to discuss more methods to obtain specific diagosis - including right heart catheterization versus surgical lung biopsy versus bronchoscopy with lavage  - respect desire to be skeptical about lung biopsy - continue o2 2L [place] as before - [facility] o2 system - at followup will discuss changing lisinopril due to cough  #preoprespiratory exam - low- Intermediate ffor pulmonary comp0lications following knee surgery  - Surgery in inpatient setting - Post op  care in Step down or tele with Pulmonary consultation  - Will inform Dr Stanley Sullivan  #Followup 3 months or sooner if needed   Dr. Kalman Shan, M.D., [facility].C.P Pulmonary and Critical Care Medicine Staff Physician [facility] Pager: [phone], If no answer or between  15:00h - 7:00h: call [phone]  03/26/2016 4:25 PM

## 2016-03-26 NOTE — Patient Instructions (Addendum)
ICD-9-CM ICD-10-CM   1. Preoperative respiratory examination V72.82 Z01.811   2. ILD (interstitial lung disease) (HCC) 515 J84.9     #ILD  - still present; unchanged  - cause unknown - at some point we need to discuss more methods to obtain specific diagosis - respect desire to be skeptical about lung biopsy - continue o2 2L [place] as before - [facility] - at followup will discuss changing lisinopril due to cough  #preoprespiratory exam - Intermediate - low risk for pulmonary comp0lications following knee surgery  - Surgery in inpatient setting - Post op care in Step down or tele with Pulmonary consultation  - Will inform Dr Cleophas DunkerWhitfield  #Followup 3 months or sooner if needed

## 2016-03-29 ENCOUNTER — Ambulatory Visit: Payer: Medicare Other | Admitting: Internal Medicine

## 2016-03-29 ENCOUNTER — Ambulatory Visit: Payer: Medicare Other

## 2016-03-30 NOTE — Progress Notes (Signed)
Discussed at 11.27.17 OV. Nothing further needed at this time.

## 2016-04-05 ENCOUNTER — Telehealth (INDEPENDENT_AMBULATORY_CARE_PROVIDER_SITE_OTHER): Payer: Self-pay | Admitting: Orthopaedic Surgery

## 2016-04-05 NOTE — Telephone Encounter (Signed)
Patient's wife called and has questions about patient's knee surgery. Please call [phone]

## 2016-04-05 NOTE — Telephone Encounter (Signed)
Please call.

## 2016-04-06 NOTE — Telephone Encounter (Signed)
[place] family called re scheduling of surgery for Mr Pownall-do you need anything from us? Do you have a tentative date?Son, [name] is person to contact-his cell number is on file--thanks

## 2016-04-08 DIAGNOSIS — J8410 Pulmonary fibrosis, unspecified: Secondary | ICD-10-CM | POA: Diagnosis not present

## 2016-04-16 ENCOUNTER — Other Ambulatory Visit: Payer: Self-pay | Admitting: Internal Medicine

## 2016-04-16 DIAGNOSIS — N400 Enlarged prostate without lower urinary tract symptoms: Secondary | ICD-10-CM

## 2016-05-02 ENCOUNTER — Encounter: Payer: Self-pay | Admitting: Internal Medicine

## 2016-05-02 ENCOUNTER — Ambulatory Visit (INDEPENDENT_AMBULATORY_CARE_PROVIDER_SITE_OTHER): Payer: Medicare Other | Admitting: Internal Medicine

## 2016-05-02 VITALS — BP 162/98 | HR 66 | Temp 97.7°F | Resp 16 | Ht 65.0 in | Wt 174.8 lb

## 2016-05-02 DIAGNOSIS — J849 Interstitial pulmonary disease, unspecified: Secondary | ICD-10-CM | POA: Diagnosis not present

## 2016-05-02 DIAGNOSIS — J209 Acute bronchitis, unspecified: Secondary | ICD-10-CM

## 2016-05-02 DIAGNOSIS — L309 Dermatitis, unspecified: Secondary | ICD-10-CM

## 2016-05-02 DIAGNOSIS — I10 Essential (primary) hypertension: Secondary | ICD-10-CM

## 2016-05-02 DIAGNOSIS — M25511 Pain in right shoulder: Secondary | ICD-10-CM | POA: Diagnosis not present

## 2016-05-02 MED ORDER — TRIAMCINOLONE ACETONIDE 0.025 % EX OINT: 1.0000 "application " | TOPICAL_OINTMENT | Freq: Two times a day (BID) | CUTANEOUS | 3 refills | Status: DC

## 2016-05-02 MED ORDER — ALBUTEROL SULFATE (2.5 MG/3ML) 0.083% IN NEBU
2.5000 mg | INHALATION_SOLUTION | Freq: Once | RESPIRATORY_TRACT | Status: AC
  Administered 2016-05-02: 2.5 mg via RESPIRATORY_TRACT

## 2016-05-02 MED ORDER — ALBUTEROL SULFATE HFA 108 (90 BASE) MCG/ACT IN AERS: 2.0000 | INHALATION_SPRAY | Freq: Four times a day (QID) | RESPIRATORY_TRACT | 5 refills | Status: DC | PRN

## 2016-05-02 MED ORDER — AZITHROMYCIN 250 MG PO TABS: ORAL_TABLET | ORAL | 0 refills | Status: DC

## 2016-05-02 NOTE — Patient Instructions (Addendum)
Start Zpak to take as directed for bronchitis  Take probiotic daily while on antibiotic  Use inhaler as needed for cough, shortness of breath or wheezing  Use steroid ointment for rash as needed  Push fluids and rest  Continue pain medication as ordered  Reduce weight to 20lbs max when lifting  Follow up as scheduled with Dr Renato Gailseed. Follow up with specialists as scheduled

## 2016-05-02 NOTE — Progress Notes (Signed)
Patient ID: Stanley Sullivan, male   DOB: 07-30-1940, 76 y.o.   MRN: [id number]    Location:  [facility] Place of Service: OFFICE  Chief Complaint  Patient presents with  . Acute Visit    right shoulder pain and dry cough    HPI:  76 yo male seen today for right shoulder pain and dry cough. Pain in shoulder x approx 2 weeks. No known injury. He goes to the gym 2 times per week and is lifting 30-40lbs at a time. He has tingling in UE. Pain interrupts sleep. Tried OTC tylenol and advil without relief. Tried heat without relief.   He has a dry cough x 15 days. No N/V. No post nasal drip, sore throat, odynophagia or dysphagia, f/c. Takes OTC plain allergy medication which helps. Promethazine with codeine helps cough.  He has itching in mid back x 1 year. No rash. Tried OTC cream without relief. In the past he has had a rash that improved with allergy medication.   He has a hx ILD and is followed by pulmonary. He does not have HFA  BP has been elevated since BP meds adjusted    Past Medical History:  Diagnosis Date  . Allergy, unspecified not elsewhere classified   . Chronic kidney disease, stage III (moderate)   . External hemorrhoids without mention of complication   . Gout, unspecified   . Hypertrophy of prostate without urinary obstruction and other lower urinary tract symptoms (LUTS)   . Obesity, unspecified   . Organic sleep apnea, unspecified   . Osteoarthrosis, unspecified whether generalized or localized, unspecified site   . Other abnormal glucose   . Other and unspecified hyperlipidemia   . Other atopic dermatitis and related conditions   . Other specified visual disturbances   . Pain in joint, lower leg   . Pain in limb   . Unspecified disorder of kidney and ureter   . Unspecified essential hypertension   . Urticaria, unspecified     Past Surgical History:  Procedure Laterality Date  . CATARACT EXTRACTION      Patient Care Team: Kermit Balo, DO as PCP - General  (Geriatric Medicine) Kalman Shan, MD as Consulting Physician (Pulmonary Disease) Yates Decamp, MD as Consulting Physician (Cardiology) Pollyann Savoy, MD as Consulting Physician (Rheumatology) Mia Creek, MD as Consulting Physician (Ophthalmology)  Social History   Social History  . Marital status: Married    Spouse name: N/A  . Number of children: N/A  . Years of education: N/A   Occupational History  . retired    Social History Main Topics  . Smoking status: Former Smoker    Packs/day: 0.50    Years: 15.00    Types: Cigarettes    Quit date: 04/30/1993  . Smokeless tobacco: Former Neurosurgeon    Types: Chew  . Alcohol use Yes     Comment: occassional  . Drug use: No  . Sexual activity: Not on file   Other Topics Concern  . Not on file   Social History Narrative  . No narrative on file     reports that he quit smoking about 23 years ago. His smoking use included Cigarettes. He has a 7.50 pack-year smoking history. He has quit using smokeless tobacco. His smokeless tobacco use included Chew. He reports that he drinks alcohol. He reports that he does not use drugs.  No family history on file. Family Status  Relation Status  . Mother Deceased   46  . Father  Deceased at age 77     No Known Allergies  Medications: Patient's Medications  New Prescriptions   ALBUTEROL (PROVENTIL HFA;VENTOLIN HFA) 108 (90 BASE) MCG/ACT INHALER    Inhale 2 puffs into the lungs every 6 (six) hours as needed for wheezing or shortness of breath (or cough).   AZITHROMYCIN (ZITHROMAX) 250 MG TABLET    Take 2 tabs on day 1 then take 1 tab po daily x 4 days   TRIAMCINOLONE (KENALOG) 0.025 % OINTMENT    Apply 1 application topically 2 (two) times daily.  Previous Medications   ACETAMINOPHEN (TYLENOL) 500 MG TABLET    Take 500 mg by mouth every 6 (six) hours as needed for pain. Take 1-2 tablets by mouth every 6-8 hours as needed for pain.   ALLOPURINOL (ZYLOPRIM) 100 MG TABLET    Take 1 tablet  (100 mg total) by mouth daily. For gout   ATORVASTATIN (LIPITOR) 40 MG TABLET    TAKE 1 TABLET (40 MG TOTAL) BY MOUTH DAILY.   CHOLECALCIFEROL (VITAMIN D3) 2000 UNITS TABS    Take by mouth daily.   FUROSEMIDE (LASIX) 20 MG TABLET    Take 1 tablet (20 mg total) by mouth daily. For swelling of legs   GLUCOSE BLOOD TEST STRIP    Use as instructed   LANCETS (ONETOUCH ULTRASOFT) LANCETS    Use as instructed   LISINOPRIL (PRINIVIL,ZESTRIL) 10 MG TABLET    Take 1 tablet (10 mg total) by mouth daily. To control blood pressure   METOPROLOL SUCCINATE (TOPROL-XL) 100 MG 24 HR TABLET    Take 1 tablet (100 mg total) by mouth daily.   PROMETHAZINE-CODEINE (PHENERGAN WITH CODEINE) 6.25-10 MG/5ML SYRUP    Take 5 mLs by mouth every 6 (six) hours as needed for cough.   TAMSULOSIN (FLOMAX) 0.4 MG CAPS CAPSULE    TAKE 1 CAPSULE BY MOUTH ONCE A DAY FOR YOUR PROSTATE   VITAMIN B-12 (CYANOCOBALAMIN) 1000 MCG TABLET    Take 1,000 mcg by mouth daily.  Modified Medications   No medications on file  Discontinued Medications   No medications on file    Review of Systems  Vitals:   05/02/16 1502  BP: (!) 162/98  Pulse: 66  Resp: 16  Temp: 97.7 F (36.5 C)  TempSrc: Oral  SpO2: (!) 83%  Weight: 174 lb 12.8 oz (79.3 kg)  Height: 5\' 5"  (1.651 m)   Body mass index is 29.09 kg/m.  Physical Exam  Constitutional: He is oriented to person, place, and time. He appears well-developed and well-nourished.  HENT:  Right Ear: External ear normal.  Left Ear: External ear normal.  Mouth/Throat: No oropharyngeal exudate.  TMs dull but no air fluid level, redness or bulging. Nares dry and patent with grey turbinates; oropharynx cobblestoning but no redness  Eyes: Pupils are equal, round, and reactive to light. No scleral icterus.  Neck: Neck supple. No tracheal deviation present.  Cardiovascular: Normal rate, regular rhythm and intact distal pulses.  Exam reveals gallop. Exam reveals no friction rub.   Murmur (1/6 SEM)  heard. Pulmonary/Chest: He has wheezes (b/l end expiratory with prolonged expiratory phase). He has rales (at baseb b/l).  Musculoskeletal: He exhibits edema and tenderness (right subacromial bursa TTP with warmth to touch; swelling noted; right AC joint TTP ).  [facility]; swelling right subacromial bursa with TTP and increased warmth to touch  Lymphadenopathy:    He has no cervical adenopathy.  Neurological: He  is alert and oriented to person, place, and time.  Skin: Skin is warm and dry. Rash (dry patchy eczematous rash on back with some postinflammatory changes) noted. No erythema.  Psychiatric: He has a normal mood and affect. His behavior is normal. Thought content normal.     Labs reviewed: Clinical Support on 03/20/2016  Component Date Value Ref Range Status  . FVC-Pre 03/20/2016 2.55  L Preliminary  . FVC-%Pred-Pre 03/20/2016 76  % Preliminary  . FEV1-Pre 03/20/2016 2.00  L Preliminary  . FEV1-%Pred-Pre 03/20/2016 78  % Preliminary  . FEV6-Pre 03/20/2016 2.55  L Preliminary  . FEV6-%Pred-Pre 03/20/2016 82  % Preliminary  . Pre FEV1/FVC ratio 03/20/2016 78  % Preliminary  . FEV1FVC-%Pred-Pre 03/20/2016 103  % Preliminary  . Pre FEV6/FVC Ratio 03/20/2016 100  % Preliminary  . FEV6FVC-%Pred-Pre 03/20/2016 107  % Preliminary  . FEF 25-75 Pre 03/20/2016 1.75  L/sec Preliminary  . FEF2575-%Pred-Pre 03/20/2016 92  % Preliminary  . DLCO unc 03/20/2016 8.32  ml/min/mmHg Preliminary  . DLCO unc % pred 03/20/2016 32  % Preliminary  . DLCO cor 03/20/2016 8.66  ml/min/mmHg Preliminary  . DLCO cor % pred 03/20/2016 33  % Preliminary  . DL/VA 78/29/5621 3.08  ml/min/mmHg/L Preliminary  . DL/VA % pred 65/78/4696 71  % Preliminary    No results found.   Assessment/Plan   ICD-9-CM ICD-10-CM   1. Acute bronchitis, unspecified organism 466.0 J20.9 albuterol (PROVENTIL HFA;VENTOLIN HFA) 108 (90 Base) MCG/ACT inhaler  2. Interstitial lung disease (HCC)  515 J84.9   3. Eczema, unspecified type 692.9 L30.9 triamcinolone (KENALOG) 0.025 % ointment  4. Pain in right acromioclavicular joint 719.41 M25.511   5. Essential hypertension 401.9 I10     Continue current BP meds. Check BP at home  Start Zpak to take as directed for bronchitis  Take probiotic daily while on antibiotic  Use inhaler as needed for cough, shortness of breath or wheezing  Use steroid ointment for rash as needed  Push fluids and rest  Continue pain medication as ordered  Reduce weight to 20lbs max when lifting  Albuterol nebulizer given today  Follow up as scheduled with Dr Renato Gails. Follow up with specialists as scheduled  Khalilah Hoke S. Ancil Linsey  [facility] [address] [place], [place] [address] [phone] Cell (Monday-Friday 8 AM - 5 PM) [phone] After 5 PM and follow prompts

## 2016-05-03 NOTE — Progress Notes (Signed)
Dee, Please call [name] and verify with his wife that he is taking both metoprolol 100mg  daily and lisinopril 10mg  daily.  If so, please advise him to increase his lisinopril to 20mg  from 10mg  daily.  Continue to check BP at home daily and notify me if it is still not less than 150/90 consistently so it can be adjusted appropriately.

## 2016-05-09 ENCOUNTER — Ambulatory Visit (INDEPENDENT_AMBULATORY_CARE_PROVIDER_SITE_OTHER): Payer: Medicare Other

## 2016-05-09 ENCOUNTER — Encounter (INDEPENDENT_AMBULATORY_CARE_PROVIDER_SITE_OTHER): Payer: Self-pay | Admitting: Orthopedic Surgery

## 2016-05-09 ENCOUNTER — Ambulatory Visit (INDEPENDENT_AMBULATORY_CARE_PROVIDER_SITE_OTHER): Payer: Medicare Other | Admitting: Orthopedic Surgery

## 2016-05-09 VITALS — BP 166/82 | HR 76 | Temp 97.3°F | Resp 12 | Ht 66.0 in | Wt 174.0 lb

## 2016-05-09 DIAGNOSIS — R202 Paresthesia of skin: Secondary | ICD-10-CM

## 2016-05-09 DIAGNOSIS — M25562 Pain in left knee: Secondary | ICD-10-CM | POA: Diagnosis not present

## 2016-05-09 DIAGNOSIS — R200 Anesthesia of skin: Secondary | ICD-10-CM

## 2016-05-09 DIAGNOSIS — G8929 Other chronic pain: Secondary | ICD-10-CM | POA: Diagnosis not present

## 2016-05-09 DIAGNOSIS — J8410 Pulmonary fibrosis, unspecified: Secondary | ICD-10-CM | POA: Diagnosis not present

## 2016-05-09 NOTE — Progress Notes (Signed)
Stanley Campbell, MD   Jacqualine Code, PA-C [address] [place], [place]  [address]                             [phone]   ORTHOPAEDIC HISTORY & PHYSICAL  Stanley Sullivan MRN:  [id number] DOB/SEX:  1940-06-30/male  CHIEF COMPLAINT:  Painful right Knee  HISTORY: Patient is a 76 y.o. male presented with a history of pain in the right knee for 3 years. Onset of symptoms was gradual starting 3 years ago with gradually worsening course since that time. Prior procedures on the knee are none. Patient has been treated conservatively with over-the-counter NSAIDs and activity modification. Patient currently rates pain in the knee at 4 out of 10 with activity. There is pain at night. present.  They have been previously treated with: NSAIDS: could not take because of stage 3 CKD  Knee injection with corticosteroid  was performed Knee injection with visco supplementation was not performed Medications:  none  MRI scan revealed  1. Radial tear of the posterior horn of the medial meniscus adjacent to the meniscal root. Radial tear of the posterior horn- body of the medial meniscus. 2. Tricompartmental cartilage abnormalities as described above. 3. Intact ACL.  Mucinous degeneration of the ACL. 4. Large joint effusion.  PAST MEDICAL HISTORY: Patient Active Problem List   Diagnosis Date Noted  . Interstitial lung disease (HCC) 10/29/2014  . Primary osteoarthritis of right knee 10/29/2014  . DM (diabetes mellitus) type II controlled with renal manifestation (HCC) 10/29/2014  . OSA (obstructive sleep apnea) 10/09/2013  . ILD (interstitial lung disease) (HCC) 10/09/2013  . Cough 10/07/2013  . Dyspnea 10/07/2013  . Osteoarthritis of left knee 08/24/2013  . Benign prostatic hypertrophy 08/24/2013  . Essential hypertension, benign 08/24/2013  . Gout 08/24/2013  . Type II or unspecified type diabetes mellitus without mention of complication, not stated as uncontrolled 08/24/2013  .  Chronic idiopathic constipation 04/10/2013  . Dyspnea on exertion 04/10/2013  . Osteoarthrosis, unspecified whether generalized or localized, unspecified site   . Chronic kidney disease, stage III (moderate)   . Obesity, unspecified   . Gout, unspecified   . Other and unspecified hyperlipidemia   . Essential hypertension   . Hypertrophy of prostate without urinary obstruction and other lower urinary tract symptoms (LUTS)   . Maculopapular rash, generalized 10/02/2012   Past Medical History:  Diagnosis Date  . Allergy, unspecified not elsewhere classified   . Chronic kidney disease, stage III (moderate)   . External hemorrhoids without mention of complication   . Gout, unspecified   . Hypertrophy of prostate without urinary obstruction and other lower urinary tract symptoms (LUTS)   . Obesity, unspecified   . Organic sleep apnea, unspecified   . Osteoarthrosis, unspecified whether generalized or localized, unspecified site   . Other abnormal glucose   . Other and unspecified hyperlipidemia   . Other atopic dermatitis and related conditions   . Other specified visual disturbances   . Pain in joint, lower leg   . Pain in limb   . Unspecified disorder of kidney and ureter   . Unspecified essential hypertension   . Urticaria, unspecified    Past Surgical History:  Procedure Laterality Date  . CATARACT EXTRACTION       MEDICATIONS PRIOR TO ADMISSION: Prior to Admission medications   Medication Sig Start Date End Date Taking? Authorizing Provider  acetaminophen (TYLENOL) 500 MG tablet Take  500 mg by mouth every 6 (six) hours as needed for pain. Take 1-2 tablets by mouth every 6-8 hours as needed for pain.   Yes Historical Provider, MD  albuterol (PROVENTIL HFA;VENTOLIN HFA) 108 (90 Base) MCG/ACT inhaler Inhale 2 puffs into the lungs every 6 (six) hours as needed for wheezing or shortness of breath (or cough). 05/02/16  Yes Kirt BoysMonica Carter, DO  allopurinol (ZYLOPRIM) 100 MG tablet Take 1  tablet (100 mg total) by mouth daily. For gout 10/13/15  Yes Tiffany L Reed, DO  atorvastatin (LIPITOR) 40 MG tablet TAKE 1 TABLET (40 MG TOTAL) BY MOUTH DAILY. 04/16/16  Yes Tiffany L Reed, DO  cetirizine (KLS ALLER-TEC) 10 MG tablet Take 10 mg by mouth daily.   Yes Historical Provider, MD  Cholecalciferol (VITAMIN D3) 2000 units TABS Take by mouth daily.   Yes Historical Provider, MD  furosemide (LASIX) 20 MG tablet Take 1 tablet (20 mg total) by mouth daily. For swelling of legs 07/18/15  Yes Tiffany L Reed, DO  glucose blood test strip Use as instructed 07/18/15  Yes Tiffany L Reed, DO  Lancets [facility](ONETOUCH ULTRASOFT) lancets Use as instructed 05/06/14  Yes Tiffany L Reed, DO  lisinopril (PRINIVIL,ZESTRIL) 10 MG tablet Take 1 tablet (10 mg total) by mouth daily. To control blood pressure 01/06/16  Yes Tiffany L Reed, DO  metoprolol succinate (TOPROL-XL) 100 MG 24 hr tablet Take 1 tablet (100 mg total) by mouth daily. 12/26/15  Yes Tiffany L Reed, DO  tamsulosin (FLOMAX) 0.4 MG CAPS capsule TAKE 1 CAPSULE BY MOUTH ONCE A DAY FOR YOUR PROSTATE 04/16/16  Yes Tiffany L Reed, DO  triamcinolone (KENALOG) 0.025 % ointment Apply 1 application topically 2 (two) times daily. 05/02/16  Yes Kirt BoysMonica Carter, DO  vitamin B-12 (CYANOCOBALAMIN) 1000 MCG tablet Take 1,000 mcg by mouth daily.   Yes Historical Provider, MD  azithromycin (ZITHROMAX) 250 MG tablet Take 2 tabs on day 1 then take 1 tab po daily x 4 days Patient not taking: Reported on 05/09/2016 05/02/16   Kirt BoysMonica Carter, DO  promethazine-codeine (PHENERGAN WITH CODEINE) 6.25-10 MG/5ML syrup Take 5 mLs by mouth every 6 (six) hours as needed for cough. Patient not taking: Reported on 05/09/2016 03/26/16   Kermit Baloiffany L Reed, DO     ALLERGIES:  No Known Allergies  REVIEW OF SYSTEMS:  Review of Systems  Constitutional: Negative.   HENT: Negative.   Respiratory:       History of COPD presently on O2 daily. Sleep apnea being treated with CPAP at nighttime    Cardiovascular:       History of hypertension  Gastrointestinal: Negative.   Genitourinary: Negative.   Skin: Negative.   Neurological: Positive for tingling (in right thumb associated with pain in the right upper extremity).  Endo/Heme/Allergies: Negative.   Psychiatric/Behavioral: Negative.     FAMILY HISTORY:  History reviewed. No pertinent family history.  SOCIAL HISTORY:   Social History   Occupational History  . retired    Social History Main Topics  . Smoking status: Former Smoker    Packs/day: 0.50    Years: 15.00    Types: Cigarettes    Quit date: 04/30/1993  . Smokeless tobacco: Former NeurosurgeonUser    Types: Chew  . Alcohol use Yes     Comment: occassional  . Drug use: No  . Sexual activity: Not on file     EXAMINATION:  Vital signs in last 24 hours: BP (!) 166/82 (BP Location: Left Arm, Patient Position: Sitting,  Cuff Size: Large)   Pulse 76   Temp 97.3 F (36.3 C) (Tympanic)   Resp 12   Ht 5\' 6"  (1.676 m)   Wt 174 lb (78.9 kg)   BMI 28.08 kg/m   Physical Exam  Constitutional: He is oriented to person, place, and time. He appears well-developed and well-nourished.  HENT:  Head: Normocephalic and atraumatic.  Eyes: EOM are normal. Pupils are equal, round, and reactive to light.  Neck:  No carotid bruits  Cardiovascular: Normal rate, regular rhythm and normal heart sounds.   Pulmonary/Chest: Effort normal. He has wheezes (Intermittent).  Course breath sounds were present  Abdominal: Soft. Bowel sounds are normal. There is no tenderness.  Musculoskeletal:       Right knee: He exhibits effusion (Mild to moderate).  Neurological: He is alert and oriented to person, place, and time.  Skin: Skin is warm and dry.  Psychiatric: He has a normal mood and affect. His behavior is normal. Judgment and thought content normal.   Right Knee Exam   Tenderness  The patient is experiencing tenderness in the medial joint line.  Range of Motion  Right knee extension:  3-5 degrees.  Flexion: 110   Tests   Valgus: positive (Opens medially with a good endpoint)  Other  Other tests: effusion (Mild to moderate) present  Comments:  Crepitance with range of motion patellofemoral. Some tenderness more medial than lateral. Varus positioning of the knee but is correctable almost to neutral.   Left Knee Exam   Tenderness  The patient is experiencing tenderness in the medial hamstring.  Range of Motion  Left knee extension: 3  Flexion: 110   Tests   Valgus: positive (Opening with valgus stress but has a good endpoint.)  Comments:  Crepitance with range of motion patellofemoral. Some tenderness more medial than lateral. Varus positioning of the knee but is correctable almost to neutral.   Back Exam   Muscle Strength  Back normal muscle strength: He has good strength in the upper extremities bilateral and symmetric.  Reflexes  Biceps: 0/4 (Biceps triceps and brachial radialis are absent in the upper extremities. He has good strength)      Imaging Review Plain radiographs demonstrate moderate degenerative joint disease of the bilateral knee. The overall alignment is mild varus. The bone quality appears to be fair for age and reported activity level.  Mr Knee Right Wo Contrast  Result Date: 02/16/2016 CLINICAL DATA:  Chronic right knee pain X 2 years.  No injury. EXAM: MRI OF THE RIGHT KNEE WITHOUT CONTRAST TECHNIQUE: Multiplanar, multisequence MR imaging of the knee was performed. No intravenous contrast was administered. COMPARISON:  None. FINDINGS: MENISCI Medial meniscus: Radial tear of the posterior horn of the medial meniscus adjacent to the meniscal root. Radial tear of the posterior horn- body of the medial meniscus. Lateral meniscus:  Intact. LIGAMENTS Cruciates: Intact ACL and PCL. Increased signal and expansion of the ACL as can be seen with mucinous degeneration. Collaterals:  Sir collaterals CARTILAGE Patellofemoral: Full-thickness  cartilage loss of the lateral patellar facet. Medial: Full-thickness cartilage loss of the medial femorotibial compartment with subchondral reactive marrow changes. Lateral: Small focal area of cartilage fissuring involving the lateral femoral condyle. Joint: Large joint effusion. Normal Hoffa's fat. No plical thickening. Popliteal Fossa:  No Baker cyst.  Intact popliteus tendon. Extensor Mechanism:  Intact quadriceps tendon and patellar tendon. Bones: No other marrow signal abnormality. No fracture or dislocation. Other: No fluid collection or hematoma. IMPRESSION: 1. Radial tear  of the posterior horn of the medial meniscus adjacent to the meniscal root. Radial tear of the posterior horn- body of the medial meniscus. 2. Tricompartmental cartilage abnormalities as described above. 3. Intact ACL.  Mucinous degeneration of the ACL. 4. Large joint effusion. Electronically Signed   By: Elige Ko   On: 02/16/2016  Cervical spine 3 views reveals C2-3 DDD. Appears to have a retrolisthesis at C4-5. Degenerative disc disease is noted for 5-6/7 worse 56 last. Anterior spurring is noted diffusely about C4-5 C6 and C7.  4 view x-ray of the left knee does reveal some narrowing of the medial compartment. Appears to have some cystic changes on the medial femoral condyle. Peak intercondylar spines. Little bit of periarticular spurring on the medial joint    ASSESSMENT: End stage arthritis, right knee  Past Medical History:  Diagnosis Date  . Allergy, unspecified not elsewhere classified   . Chronic kidney disease, stage III (moderate)   . External hemorrhoids without mention of complication   . Gout, unspecified   . Hypertrophy of prostate without urinary obstruction and other lower urinary tract symptoms (LUTS)   . Obesity, unspecified   . Organic sleep apnea, unspecified   . Osteoarthrosis, unspecified whether generalized or localized, unspecified site   . Other abnormal glucose   . Other and unspecified  hyperlipidemia   . Other atopic dermatitis and related conditions   . Other specified visual disturbances   . Pain in joint, lower leg   . Pain in limb   . Unspecified disorder of kidney and ureter   . Unspecified essential hypertension   . Urticaria, unspecified     PLAN: Plan for right total knee replacement.  The patient history, physical examination and imaging studies are consistent with advanced degenerative joint disease of the bilateral knee. The patient has failed conservative treatment.  The clearance notes were reviewed.  After discussion with the patient it was felt that Total Knee Replacement was indicated. The procedure,  risks, and benefits of total knee arthroplasty were presented and reviewed. The risks including but not limited to aseptic loosening, infection, blood clots, vascular and nerve injury, stiffness, patella tracking problems and fracture complications among others were discussed. The patient acknowledged the explanation, agreed to proceed with total knee replacement.  Jacqualine Code 05/09/2016, 1:43 PM

## 2016-05-10 ENCOUNTER — Encounter (INDEPENDENT_AMBULATORY_CARE_PROVIDER_SITE_OTHER): Payer: Self-pay | Admitting: Orthopaedic Surgery

## 2016-05-10 ENCOUNTER — Ambulatory Visit (INDEPENDENT_AMBULATORY_CARE_PROVIDER_SITE_OTHER): Payer: Medicare Other

## 2016-05-10 ENCOUNTER — Ambulatory Visit (INDEPENDENT_AMBULATORY_CARE_PROVIDER_SITE_OTHER): Payer: Medicare Other | Admitting: Orthopaedic Surgery

## 2016-05-10 DIAGNOSIS — M545 Low back pain: Secondary | ICD-10-CM

## 2016-05-10 DIAGNOSIS — G8929 Other chronic pain: Secondary | ICD-10-CM

## 2016-05-10 DIAGNOSIS — M25562 Pain in left knee: Secondary | ICD-10-CM | POA: Diagnosis not present

## 2016-05-10 DIAGNOSIS — M25561 Pain in right knee: Secondary | ICD-10-CM | POA: Diagnosis not present

## 2016-05-10 NOTE — Progress Notes (Signed)
Office Visit Note   Patient: Stanley Sullivan           Date of Birth: 09-Jun-1940           MRN: [id number] Visit Date: 05/10/2016              Requested by: Kermit Baloiffany L Reed, DO [address] [place], [place] [address] PCP: Bufford SpikesEED, TIFFANY, DO   Assessment & Plan: Visit Diagnoses bilateral knee osteoarthritis. 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 and their son who lives in the [place] area.(by phone) discussion over 45 minutes I do not think that a knee replacement is necessary at this point. I would elect to treat the left knee nonoperatively with a cortisone injection as above. We might consider Visco supplementation lumbar spine might be creating some of the discomfort in his lower extremities and thus, the need for an MRI scan..  Family  is comfortable with the above decision  Follow-Up Instructions: No Follow-up on file.   Orders:  No orders of the defined types were placed in this encounter.  No orders of the defined types were placed in this encounter.     Procedures: No procedures performed   Clinical Data: No additional findings.   Subjective: No chief complaint on file.   Pt having surgery on his Right knee 05/22/16. He and his wife want to make sure they are all OK with the procedure, asks for MR. Panwala to be present to help with the language.  Stanley Sullivan has evidence of osteoarthritis in both knees. He has been scheduled for a right total knee replacement at the end of this month but presently is asymptomatic in the right knee. He's been experiencing more discomfort in the left knee. He's had a course of cortisone and Visco supplementation in the right knee and believes he's had at least one injection of cortisone in the left knee many years ago. He also  complains of chronic low back pain with occasional discomfort into both lower extremities.  Seen by Arlys JohnBrian in the office yesterday. Please refer to that office note.  Review of Systems   Objective: Vital Signs: There were no vitals taken for this visit.  Physical Exam  Ortho Exam right knee exam demonstrates minimal effusion. Full extension over 110 of flexion without instability. Very minimal medial joint pain. Mild patellar crepitation. No limp with weightbearing on right lower extremity. Straight leg raise negative bilaterally. Mild percussible tenderness of lower lumbar spine.  Exam of the left knee demonstrates full extension minimal effusion. Slight increased varus and mild diffuse medial joint pain. Mild patellar crepitation. Straight leg raise negative. Painless range of motion of left hip with internal/external rotation. No distal edema. Neurovascular exam intact.  Specialty Comments:  No specialty comments available.  Imaging: No results found.   PMFS History: Patient Active Problem List   Diagnosis Date Noted  . Interstitial lung disease (HCC) 10/29/2014  . Primary osteoarthritis of right knee 10/29/2014  . DM (diabetes mellitus) type II controlled with renal manifestation (HCC) 10/29/2014  . OSA (obstructive sleep apnea) 10/09/2013  . ILD (interstitial lung disease) (HCC) 10/09/2013  . Cough 10/07/2013  . Dyspnea 10/07/2013  . Osteoarthritis of left knee 08/24/2013  . Benign prostatic hypertrophy 08/24/2013  . Essential hypertension, benign 08/24/2013  . Gout 08/24/2013  . Type  II or unspecified type diabetes mellitus without mention of complication, not stated as uncontrolled 08/24/2013  . Chronic idiopathic constipation 04/10/2013  . Dyspnea on exertion 04/10/2013  . Osteoarthrosis, unspecified whether generalized or localized, unspecified site   . Chronic kidney disease, stage III (moderate)   . Obesity, unspecified   . Gout, unspecified   . Other and  unspecified hyperlipidemia   . Essential hypertension   . Hypertrophy of prostate without urinary obstruction and other lower urinary tract symptoms (LUTS)   . Maculopapular rash, generalized 10/02/2012   Past Medical History:  Diagnosis Date  . Allergy, unspecified not elsewhere classified   . Chronic kidney disease, stage III (moderate)   . External hemorrhoids without mention of complication   . Gout, unspecified   . Hypertrophy of prostate without urinary obstruction and other lower urinary tract symptoms (LUTS)   . Obesity, unspecified   . Organic sleep apnea, unspecified   . Osteoarthrosis, unspecified whether generalized or localized, unspecified site   . Other abnormal glucose   . Other and unspecified hyperlipidemia   . Other atopic dermatitis and related conditions   . Other specified visual disturbances   . Pain in joint, lower leg   . Pain in limb   . Unspecified disorder of kidney and ureter   . Unspecified essential hypertension   . Urticaria, unspecified     No family history on file.  Past Surgical History:  Procedure Laterality Date  . CATARACT EXTRACTION     Social History   Occupational History  . retired    Social History Main Topics  . Smoking status: Former Smoker    Packs/day: 0.50    Years: 15.00    Types: Cigarettes    Quit date: 04/30/1993  . Smokeless tobacco: Former Neurosurgeon    Types: Chew  . Alcohol use Yes     Comment: occassional  . Drug use: No  . Sexual activity: Not on file

## 2016-05-10 NOTE — Addendum Note (Signed)
Addended by: Mare LoanLAWSON, Tyteanna Ost M on: 05/10/2016 04:34 PM   Modules accepted: Orders

## 2016-05-10 NOTE — Progress Notes (Signed)
PW told me to cancel the surgery for pt. I called Taryn the scheduler.

## 2016-05-16 ENCOUNTER — Inpatient Hospital Stay (HOSPITAL_COMMUNITY)
Admission: RE | Admit: 2016-05-16 | Discharge: 2016-05-16 | Disposition: A | Discharge status: Home / Self Care | Payer: Medicare Other | Source: Ambulatory Visit

## 2016-05-21 ENCOUNTER — Ambulatory Visit
Admission: RE | Admit: 2016-05-21 | Discharge: 2016-05-21 | Disposition: A | Discharge status: Home / Self Care | Payer: Medicare Other | Source: Ambulatory Visit | Attending: Orthopaedic Surgery | Admitting: Orthopaedic Surgery

## 2016-05-21 DIAGNOSIS — G8929 Other chronic pain: Secondary | ICD-10-CM

## 2016-05-21 DIAGNOSIS — M545 Low back pain, unspecified: Secondary | ICD-10-CM

## 2016-05-21 DIAGNOSIS — M5126 Other intervertebral disc displacement, lumbar region: Secondary | ICD-10-CM | POA: Diagnosis not present

## 2016-05-22 ENCOUNTER — Inpatient Hospital Stay: Admit: 2016-05-22 | Payer: Medicare Other | Admitting: Orthopaedic Surgery

## 2016-05-22 SURGERY — ARTHROPLASTY, KNEE, TOTAL
Anesthesia: Choice | Site: Knee | Laterality: Right

## 2016-05-23 ENCOUNTER — Encounter (INDEPENDENT_AMBULATORY_CARE_PROVIDER_SITE_OTHER): Payer: Self-pay | Admitting: Orthopaedic Surgery

## 2016-05-24 ENCOUNTER — Ambulatory Visit (INDEPENDENT_AMBULATORY_CARE_PROVIDER_SITE_OTHER): Payer: Medicare Other | Admitting: Orthopaedic Surgery

## 2016-05-24 VITALS — Ht 65.0 in | Wt 174.0 lb

## 2016-05-24 DIAGNOSIS — G8929 Other chronic pain: Secondary | ICD-10-CM | POA: Diagnosis not present

## 2016-05-24 DIAGNOSIS — M545 Low back pain: Secondary | ICD-10-CM | POA: Diagnosis not present

## 2016-05-24 NOTE — Progress Notes (Signed)
Office Visit Note   Patient: Stanley Sullivan           Date of Birth: 1940/05/16           MRN: [id number] Visit Date: 05/24/2016              Requested by: Kermit Baloiffany L Reed, DO [address] 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 Grade 1 spondylolisthesis. No impingement or central stenosis.  Plan: Follow up as needed. [facility] his lumbar spine. Long discussion regarding  treatment options of his knees including Visco supplementation.  Follow-Up Instructions: No Follow-up on file.   Orders:  No orders of the defined types were placed in this encounter.  No orders of the defined types were placed in this encounter.     Procedures: No procedures performed   Clinical Data: No additional findings.   Subjective: Chief Complaint  Patient presents with  . Lower Back - Results    MRI Lumbar spine    Pt here today for MRI results of Lumbar spine   Mr. Stanley Sullivan relates no change in his symptoms. He is experiencing predominantly low back pain particularly when he gets up from a sitting position. He's had some lower extremity discomfort. But predominantly back pain. Review of Systems   Objective: Vital Signs: Ht 5\' 5"  (1.651 m)   Wt 174 lb (78.9 kg)   BMI 28.96 kg/m   Physical Exam  Ortho Exam straight leg raise negative bilaterally reflexes were symmetrical previous exam of right knee is unchanged. He is relatively comfortable at this point. Good pulses distally. Painless range of motion of both hips. Mild percussible tenderness of the lumbar spine.  Specialty Comments:  No specialty comments available.  Imaging: No results found.   PMFS History: Patient Active Problem List   Diagnosis Date Noted  . Interstitial lung disease (HCC) 10/29/2014  .  Primary osteoarthritis of right knee 10/29/2014  . DM (diabetes mellitus) type II controlled with renal manifestation (HCC) 10/29/2014  . OSA (obstructive sleep apnea) 10/09/2013  . ILD (interstitial lung disease) (HCC) 10/09/2013  . Cough 10/07/2013  . Dyspnea 10/07/2013  . Osteoarthritis of left knee 08/24/2013  . Benign prostatic hypertrophy 08/24/2013  . Essential hypertension, benign 08/24/2013  . Gout 08/24/2013  . Type II or unspecified type diabetes mellitus without mention of complication, not stated as uncontrolled 08/24/2013  . Chronic idiopathic constipation 04/10/2013  . Dyspnea on exertion 04/10/2013  . Osteoarthrosis, unspecified whether generalized or localized, unspecified site   . Chronic kidney disease, stage III (moderate)   . Obesity, unspecified   . Gout, unspecified   . Other and unspecified hyperlipidemia   . Essential hypertension   . Hypertrophy of prostate without urinary obstruction and other lower urinary tract symptoms (LUTS)   . Maculopapular rash, generalized 10/02/2012   Past Medical History:  Diagnosis Date  . Allergy, unspecified not elsewhere classified   . Chronic kidney disease, stage III (moderate)   . External hemorrhoids without mention of complication   . Gout, unspecified   . Hypertrophy of prostate without urinary obstruction and other lower urinary tract symptoms (LUTS)   . Obesity, unspecified   . Organic sleep apnea, unspecified   . Osteoarthrosis, unspecified whether generalized or localized, unspecified site   . Other abnormal glucose   .  Other and unspecified hyperlipidemia   . Other atopic dermatitis and related conditions   . Other specified visual disturbances   . Pain in joint, lower leg   . Pain in limb   . Unspecified disorder of kidney and ureter   . Unspecified essential hypertension   . Urticaria, unspecified     No family history on file.  Past Surgical History:  Procedure Laterality Date  . CATARACT EXTRACTION      Social History   Occupational History  . retired    Social History Main Topics  . Smoking status: Former Smoker    Packs/day: 0.50    Years: 15.00    Types: Cigarettes    Quit date: 04/30/1993  . Smokeless tobacco: Former Neurosurgeon    Types: Chew  . Alcohol use Yes     Comment: occassional  . Drug use: No  . Sexual activity: Not on file

## 2016-06-04 ENCOUNTER — Ambulatory Visit: Payer: Medicare Other | Admitting: Physical Therapy

## 2016-06-09 DIAGNOSIS — J8410 Pulmonary fibrosis, unspecified: Secondary | ICD-10-CM | POA: Diagnosis not present

## 2016-06-23 ENCOUNTER — Other Ambulatory Visit: Payer: Self-pay | Admitting: Internal Medicine

## 2016-06-25 ENCOUNTER — Ambulatory Visit: Payer: Medicare Other | Admitting: Internal Medicine

## 2016-06-26 DIAGNOSIS — R0602 Shortness of breath: Secondary | ICD-10-CM | POA: Diagnosis not present

## 2016-06-26 DIAGNOSIS — M129 Arthropathy, unspecified: Secondary | ICD-10-CM | POA: Diagnosis not present

## 2016-06-26 DIAGNOSIS — R05 Cough: Secondary | ICD-10-CM | POA: Diagnosis not present

## 2016-06-26 DIAGNOSIS — E291 Testicular hypofunction: Secondary | ICD-10-CM | POA: Diagnosis not present

## 2016-06-26 DIAGNOSIS — Z79899 Other long term (current) drug therapy: Secondary | ICD-10-CM | POA: Diagnosis not present

## 2016-06-26 DIAGNOSIS — E7800 Pure hypercholesterolemia, unspecified: Secondary | ICD-10-CM | POA: Diagnosis not present

## 2016-06-26 DIAGNOSIS — R300 Dysuria: Secondary | ICD-10-CM | POA: Diagnosis not present

## 2016-06-26 DIAGNOSIS — E559 Vitamin D deficiency, unspecified: Secondary | ICD-10-CM | POA: Diagnosis not present

## 2016-06-26 DIAGNOSIS — Z125 Encounter for screening for malignant neoplasm of prostate: Secondary | ICD-10-CM | POA: Diagnosis not present

## 2016-06-26 DIAGNOSIS — I10 Essential (primary) hypertension: Secondary | ICD-10-CM | POA: Diagnosis not present

## 2016-06-26 DIAGNOSIS — Z0000 Encounter for general adult medical examination without abnormal findings: Secondary | ICD-10-CM | POA: Diagnosis not present

## 2016-07-02 DIAGNOSIS — R05 Cough: Secondary | ICD-10-CM | POA: Diagnosis not present

## 2016-07-07 DIAGNOSIS — J8410 Pulmonary fibrosis, unspecified: Secondary | ICD-10-CM | POA: Diagnosis not present

## 2016-07-12 ENCOUNTER — Telehealth: Payer: Self-pay | Admitting: Physical Therapy

## 2016-07-12 NOTE — Telephone Encounter (Signed)
07/12/16. Pt's wife cxl PT eval on 06/04/16. Stated he has gout, said they would call to reschedule. No return call

## 2016-07-16 DIAGNOSIS — N400 Enlarged prostate without lower urinary tract symptoms: Secondary | ICD-10-CM | POA: Diagnosis not present

## 2016-07-16 DIAGNOSIS — E7800 Pure hypercholesterolemia, unspecified: Secondary | ICD-10-CM | POA: Diagnosis not present

## 2016-07-16 DIAGNOSIS — M109 Gout, unspecified: Secondary | ICD-10-CM | POA: Diagnosis not present

## 2016-07-16 DIAGNOSIS — I10 Essential (primary) hypertension: Secondary | ICD-10-CM | POA: Diagnosis not present

## 2016-07-18 ENCOUNTER — Telehealth: Payer: Self-pay | Admitting: Internal Medicine

## 2016-07-18 NOTE — Telephone Encounter (Signed)
I called to schedule the pt's AWV. His wife stated that she's upset that she was 3 mins late for last appt and it was cxcld. Says she no longer wants to be seen at this office. VDM (DD)

## 2016-08-07 DIAGNOSIS — J8410 Pulmonary fibrosis, unspecified: Secondary | ICD-10-CM | POA: Diagnosis not present

## 2016-09-06 DIAGNOSIS — J8410 Pulmonary fibrosis, unspecified: Secondary | ICD-10-CM | POA: Diagnosis not present

## 2016-09-18 ENCOUNTER — Ambulatory Visit (INDEPENDENT_AMBULATORY_CARE_PROVIDER_SITE_OTHER): Payer: Medicare Other | Admitting: Orthopaedic Surgery

## 2016-09-18 ENCOUNTER — Encounter (INDEPENDENT_AMBULATORY_CARE_PROVIDER_SITE_OTHER): Payer: Self-pay | Admitting: Orthopaedic Surgery

## 2016-09-18 VITALS — BP 147/80 | HR 71 | Ht 68.0 in | Wt 165.0 lb

## 2016-09-18 DIAGNOSIS — M1711 Unilateral primary osteoarthritis, right knee: Secondary | ICD-10-CM

## 2016-09-18 NOTE — Progress Notes (Signed)
Office Visit Note   Patient: Stanley Sullivan           Date of Birth: 14-Mar-1941           MRN: [id number] Visit Date: 09/18/2016              Requested by: Kermit Baloeed, Tiffany L, DO [address] [place], [place] [id number] PCP: Wilson SingerJariwala, Arvind N, MD   Assessment & Plan: Visit Diagnoses:  1. Unilateral primary osteoarthritis, right knee     Plan:  #1: Plan for right total knee arthroplasty in the near future after clearances are returned. They would like to have this done somewhere around the end of July.  Follow-Up Instructions: No Follow-up on file.   Orders:  No orders of the defined types were placed in this encounter.  No orders of the defined types were placed in this encounter.     Procedures: No procedures performed   Clinical Data: No additional findings.   Subjective: Chief Complaint  Patient presents with  . Right Knee - Pain    Mr. Stanley Sullivan is a 76 y o that presents with chronic Right knee pain. Swollen, redness and walked a lot on Monday. Some calf pain when he stand too long.    Mr. Stanley Sullivan is a very pleasant 76 year old male who is seen today for history of right knee pain for the last 3-4 years. He had a gradual onset of pain discomfort at that time and had gradual worsening. He did not have any other surgical procedures to the knee noted. History conservatively with over-the-counter NSAIDs and activity modification. His pain was rated at the 4-5 out of 10 with activity. He did also have nighttime pain. He has had corticosteroid injections in the past. No Visco supplementation though.  MRI scan had been performed and revealed  Radial tear of the posterior horn of the medial meniscus adjacent to the meniscal root. Radial tear of the posterior horn-body of the medial meniscus.  Tricompartmental cartilage abnormalities as described above. Intact ACL. Mucinous degeneration of the ACL.  Large joint effusion.  He had been previously scheduled for a right total knee  replacement in January but they canceled. He is now to the point where he is having significant pain and discomfort and would like to proceed with this surgery in the near future. Comes in today for reevaluation.    Review of Systems  Review of Systems  Constitutional: Negative.   HENT: Negative.   Respiratory:       History of COPD. Sleep apnea being treated with CPAP at nighttime  Cardiovascular:       History of hypertension  Gastrointestinal: Negative.   Genitourinary: Negative.   Skin: Negative.   Neurological: Negative  Endo/Heme/Allergies: Negative.   Psychiatric/Behavioral: Negative.     Objective: Vital Signs: BP (!) 147/80   Pulse 71   Ht 5\' 8"  (1.727 m)   Wt 165 lb (74.8 kg)   BMI 25.09 kg/m   Physical Exam  Constitutional: He is oriented to person, place, and time. He appears well-developed and well-nourished.  HENT:  Head: Normocephalic and atraumatic.  Eyes: EOM are normal. Pupils are equal, round, and reactive to light.  Pulmonary/Chest: Effort normal.  Neurological: He is alert and oriented to person, place, and time.  Skin: Skin is warm and dry.  Psychiatric: He has a normal mood and affect. His behavior is normal. Judgment and thought content normal.    Ortho Exam  Range of motion of  the right knee from about 5 to 112. Marked patellofemoral crepitance with range of motion. Trace effusion. Baker's cyst is noted posteriorly. Pseudo-laxity with valgus stressing but he has an excellent endpoint. Mild anterior drawer. at this time.  Specialty Comments:  No specialty comments available.  Imaging: No results found.   PMFS History: Patient Active Problem List   Diagnosis Date Noted  . Interstitial lung disease (HCC) 10/29/2014  . Primary osteoarthritis of right knee 10/29/2014  . DM (diabetes mellitus) type II controlled with renal manifestation (HCC) 10/29/2014  . OSA (obstructive sleep apnea) 10/09/2013  . ILD (interstitial lung disease) (HCC)  10/09/2013  . Cough 10/07/2013  . Dyspnea 10/07/2013  . Osteoarthritis of left knee 08/24/2013  . Benign prostatic hypertrophy 08/24/2013  . Essential hypertension, benign 08/24/2013  . Gout 08/24/2013  . Type II or unspecified type diabetes mellitus without mention of complication, not stated as uncontrolled 08/24/2013  . Chronic idiopathic constipation 04/10/2013  . Dyspnea on exertion 04/10/2013  . Osteoarthrosis, unspecified whether generalized or localized, unspecified site   . Chronic kidney disease, stage III (moderate)   . Obesity, unspecified   . Gout, unspecified   . Other and unspecified hyperlipidemia   . Essential hypertension   . Hypertrophy of prostate without urinary obstruction and other lower urinary tract symptoms (LUTS)   . Maculopapular rash, generalized 10/02/2012   Past Medical History:  Diagnosis Date  . Allergy, unspecified not elsewhere classified   . Chronic kidney disease, stage III (moderate)   . External hemorrhoids without mention of complication   . Gout, unspecified   . Hypertrophy of prostate without urinary obstruction and other lower urinary tract symptoms (LUTS)   . Obesity, unspecified   . Organic sleep apnea, unspecified   . Osteoarthrosis, unspecified whether generalized or localized, unspecified site   . Other abnormal glucose   . Other and unspecified hyperlipidemia   . Other atopic dermatitis and related conditions   . Other specified visual disturbances   . Pain in joint, lower leg   . Pain in limb   . Unspecified disorder of kidney and ureter   . Unspecified essential hypertension   . Urticaria, unspecified     History reviewed. No pertinent family history.  Past Surgical History:  Procedure Laterality Date  . CATARACT EXTRACTION     Social History   Occupational History  . retired    Social History Main Topics  . Smoking status: Former Smoker    Packs/day: 0.50    Years: 15.00    Types: Cigarettes    Quit date:  04/30/1993  . Smokeless tobacco: Former Neurosurgeon    Types: Chew  . Alcohol use Yes     Comment: occassional  . Drug use: No  . Sexual activity: Not on file

## 2016-09-20 ENCOUNTER — Telehealth (INDEPENDENT_AMBULATORY_CARE_PROVIDER_SITE_OTHER): Payer: Self-pay | Admitting: Orthopaedic Surgery

## 2016-09-20 NOTE — Telephone Encounter (Signed)
LVM with pt to please call to schedule surgery. Will try pt again at a later time. 

## 2016-09-25 ENCOUNTER — Other Ambulatory Visit (INDEPENDENT_AMBULATORY_CARE_PROVIDER_SITE_OTHER): Payer: Self-pay | Admitting: Orthopaedic Surgery

## 2016-09-25 ENCOUNTER — Telehealth: Payer: Self-pay | Admitting: Internal Medicine

## 2016-09-25 NOTE — Telephone Encounter (Signed)
Form in MR's folder up front to be dispensed at the end of the day. Forwarding to [place] to follow up on form.

## 2016-09-27 DIAGNOSIS — E559 Vitamin D deficiency, unspecified: Secondary | ICD-10-CM | POA: Diagnosis not present

## 2016-09-27 DIAGNOSIS — I10 Essential (primary) hypertension: Secondary | ICD-10-CM | POA: Diagnosis not present

## 2016-09-27 DIAGNOSIS — M129 Arthropathy, unspecified: Secondary | ICD-10-CM | POA: Diagnosis not present

## 2016-09-27 DIAGNOSIS — E7800 Pure hypercholesterolemia, unspecified: Secondary | ICD-10-CM | POA: Diagnosis not present

## 2016-09-27 DIAGNOSIS — Z79899 Other long term (current) drug therapy: Secondary | ICD-10-CM | POA: Diagnosis not present

## 2016-09-28 NOTE — Telephone Encounter (Signed)
Stanley Sullivan, wife, is calling regarding surgical clearance form, CB is [phone].

## 2016-09-28 NOTE — Telephone Encounter (Signed)
This has been placed in MR's look-ats.

## 2016-09-28 NOTE — Telephone Encounter (Signed)
Pt is aware that we will contact him with status update. Elise/MR please advise on status of paperwork. Thanks.

## 2016-10-01 NOTE — Telephone Encounter (Signed)
MR/ Robynn Panelise please advise when clearance is complete. Thanks.

## 2016-10-03 NOTE — Telephone Encounter (Signed)
Stanley Sullivan any updates as of today?

## 2016-10-04 NOTE — Telephone Encounter (Signed)
Attempted to call pt. Call went straight to voicemail. lmtcb x1 for pt. 

## 2016-10-04 NOTE — Telephone Encounter (Signed)
WhenI saw him in Nov 2017 I did clearance for knee replacement as follows. It is well documented in my progress note as well  #preoprespiratory exam  - RISK: ow- Intermediate: for pulmonary comp0lications following knee surgery  - REC: - Surgery in inpatient setting and  Post op care in Step down or tele +/- Pulmonary consultation    So,  A) why is he wanting preop clearance again ? - is it because the same knee replacemeent  surgery got postponed (most likely)  OR,  B) is he having a different surgery?  C) when is surgery?  D) how is respirtory health since his last visit with us in sept 2017? ANy worsening in dyspnea since then?  Thanks  Dr. Kalman ShanMurali Elleen Coulibaly, M.D., [facility].P Pulmonary and Critical Care Medicine Staff Physician [facility] [place] Pager: [phone], If no answer or between  15:00h - 7:00h: call [phone]  10/04/2016 6:31 AM

## 2016-10-05 NOTE — Telephone Encounter (Signed)
[name], patient's wife returning call.  CB is [phone].

## 2016-10-05 NOTE — Telephone Encounter (Signed)
Called and spoke with pt and he stated that he cannot understand what all I was asking and he will have his wife call us back once she is home.

## 2016-10-07 DIAGNOSIS — J8410 Pulmonary fibrosis, unspecified: Secondary | ICD-10-CM | POA: Diagnosis not present

## 2016-10-08 ENCOUNTER — Telehealth (INDEPENDENT_AMBULATORY_CARE_PROVIDER_SITE_OTHER): Payer: Self-pay | Admitting: Orthopaedic Surgery

## 2016-10-08 NOTE — Telephone Encounter (Signed)
Then, please send the prior note to the surgeon office that shows the clearance. And please ask surrgeon office that a) patient has no interim worsening and b) patient not seen since. And so if they want updated clearance? If they do, then book him with Tammy Parrett  Thanks  Dr. Kalman ShanMurali Marylou Wages, M.D., [facility].P Pulmonary and Critical Care Medicine Staff Physician [facility] [place] Pulmonary and Critical Care Pager: [phone], If no answer or between  15:00h - 7:00h: call [phone]  10/08/2016 12:07 PM

## 2016-10-08 NOTE — Telephone Encounter (Signed)
Spoke with receptionist with Dr Cleophas DunkerWhitfield and let her know what is going on  She states unsure if they can accept surgery clearance from back in Nov 2017  She is going to check with the nurse and have someone call us back

## 2016-10-08 NOTE — Telephone Encounter (Signed)
Called patient for contact information for the MD doing the knee surgery.      Norlene CampbellPeter Whitfield, MD [phone]  [facility] call back around 2pm.

## 2016-10-08 NOTE — Telephone Encounter (Signed)
Leslie from [facility] called inquiring about the clearance we needed through them for this patient to have surgery.  The last time they saw in November of 2017.  Do you want them to see him again.  CB#[phone].  Thank you. °

## 2016-10-08 NOTE — Telephone Encounter (Signed)
Spoke with pt's wife. She states that his original knee replacement surgery was postponed due to some complications and [place]. Pt is not having any issues at this time, such as SOB, chest tightness, wheezing or coughing.  MR - please advise. Thanks.

## 2016-10-10 ENCOUNTER — Telehealth (INDEPENDENT_AMBULATORY_CARE_PROVIDER_SITE_OTHER): Payer: Self-pay | Admitting: Orthopaedic Surgery

## 2016-10-10 NOTE — Telephone Encounter (Signed)
PLease advise

## 2016-10-10 NOTE — Telephone Encounter (Signed)
Yes unless they think he is cleared without seeing him and will document that

## 2016-10-10 NOTE — Telephone Encounter (Signed)
LM for Dr Hoy RegisterWhitfield's nurse to return call.   03/26/16 #preoprespiratory exam - Intermediate - low risk for pulmonary comp0lications following knee surgery  - Surgery in inpatient setting - Post op care in Step down or tele with Pulmonary consultation  - Will inform Dr Cleophas DunkerWhitfield

## 2016-10-10 NOTE — Telephone Encounter (Signed)
Verlon AuLeslie from [facility] called inquiring about the clearance we needed through them for this patient to have surgery.  The last time they saw in November of 2017.  Do you want them to see him again.  CB#[phone].  Thank you.

## 2016-10-11 NOTE — Telephone Encounter (Signed)
Called and spoke with Marylu LundJanet and she states Dr Cleophas DunkerWhitfield can accept the note from Nov 2017, but she does need us to sign a form. She will be faxing this today. I advised will fax back to them once MR returns to clinic and can sign. Will forward this to EE to keep an eye out for form, thanks!

## 2016-10-11 NOTE — Telephone Encounter (Signed)
I confirmed that the form to be signed is indeed in MR's lookat cubby  Will forward back to EE to have him sign when he returns, thanks!

## 2016-10-11 NOTE — Telephone Encounter (Signed)
Marylu LundJanet from Dr. Hoy RegisterWhitfield's office returned phone call, contact # [phone]...ert

## 2016-10-11 NOTE — Telephone Encounter (Signed)
Spoke with Verlon AuLeslie and she said Dr. Marchelle Gearingamaswamy spoke with pt and he reported no SOB. I faxed a clearance form to be completed.

## 2016-10-11 NOTE — Telephone Encounter (Signed)
I have received the surgery clearance form and have placed this in MR's look-ats. He will be back in the office next week.

## 2016-10-11 NOTE — Telephone Encounter (Signed)
Attempted to contact Dr. Hoy RegisterWhitfield's office. I was placed on a long hold with no one coming to the line. Will try back.

## 2016-10-16 NOTE — Telephone Encounter (Signed)
ok 

## 2016-10-19 NOTE — Telephone Encounter (Signed)
Patient's wife returned call.  She was advised that surgery clearance form has been completed and faxed to referring office.  No call back is needed.

## 2016-10-19 NOTE — Telephone Encounter (Signed)
MR please advise if this form has been completed. Thanks. 

## 2016-10-19 NOTE — Telephone Encounter (Signed)
Surgery clearance form has been completed and fax back to referring office.   LMTCB for pt.

## 2016-10-25 DIAGNOSIS — J8410 Pulmonary fibrosis, unspecified: Secondary | ICD-10-CM | POA: Diagnosis not present

## 2016-11-06 DIAGNOSIS — J8410 Pulmonary fibrosis, unspecified: Secondary | ICD-10-CM | POA: Diagnosis not present

## 2016-11-14 ENCOUNTER — Ambulatory Visit (INDEPENDENT_AMBULATORY_CARE_PROVIDER_SITE_OTHER): Payer: Medicare Other | Admitting: Orthopedic Surgery

## 2016-11-14 ENCOUNTER — Encounter (INDEPENDENT_AMBULATORY_CARE_PROVIDER_SITE_OTHER): Payer: Self-pay | Admitting: Orthopedic Surgery

## 2016-11-14 VITALS — BP 158/71 | HR 63 | Temp 97.9°F | Resp 16 | Ht 64.0 in | Wt 172.0 lb

## 2016-11-14 DIAGNOSIS — M1711 Unilateral primary osteoarthritis, right knee: Secondary | ICD-10-CM

## 2016-11-14 NOTE — Progress Notes (Addendum)
Stanley CampbellPeter Whitfield, MD   Stanley CodeBrian Britteny Fiebelkorn, PA-C [address], [place], [place]  [address]                             [phone]   ORTHOPAEDIC HISTORY & PHYSICAL  Stanley Sullivan MRN:  [id number] DOB/SEX:  04/04/1941/male  CHIEF COMPLAINT:  Painful right Knee  HISTORY:  Stanley Sullivan is a very pleasant 76 year old male who is seen today for history of right knee pain for the last 3-4 years. He had a gradual onset of pain discomfort at that time and had gradual worsening. He did not have any other surgical procedures to the knee noted. History conservatively with over-the-counter NSAIDs and activity modification. His pain was rated at the 4-5 out of 10 with activity. He did also have nighttime pain. He has had corticosteroid injections in the past. No Visco supplementation though.  MRI scan had been performed and revealed FINDINGS: MENISCI Medial meniscus: Radial tear of the posterior horn of the medial meniscus adjacent to the meniscal root. Radial tear of the posterior horn- body of the medial meniscus. Lateral meniscus:  Intact.  LIGAMENTS Cruciates: Intact ACL and PCL. Increased signal and expansion of the ACL as can be seen with mucinous degeneration.  CARTILAGE Patellofemoral: Full-thickness cartilage loss of the lateral patellar facet. Medial: Full-thickness cartilage loss of the medial femorotibial compartment with subchondral reactive marrow changes. Lateral: Small focal area of cartilage fissuring involving the lateral femoral condyle.  Joint: Large joint effusion. Normal Hoffa's fat. No plical thickening. Popliteal Fossa:  No Baker cyst.  Intact popliteus tendon. Extensor Mechanism:  Intact quadriceps tendon and patellar tendon. Bones: No other marrow signal abnormality. No fracture or Dislocation.   He had been previously scheduled for a right total knee replacement in January but they canceled. He is now to the point where he is having significant pain and  discomfort and would like to proceed with this surgery in the near future.   Patient is a 76 y.o. male presented with a history of pain in the right knee for 3 years. Onset of symptoms was gradual starting 3 years ago with gradually worsening course since that time. Prior procedures on the knee are none. Patient has been treated conservatively with over-the-counter NSAIDs and activity modification. Patient currently rates pain in the knee at 4 out of 10 with activity. There is pain at night. present.  They have been previously treated with: NSAIDS: could not take because of stage 3 CKD  Knee injection with corticosteroid  was performed Knee injection with visco supplementation was not performed Medications:  none  PAST MEDICAL HISTORY: Patient Active Problem List   Diagnosis Date Noted  . Interstitial lung disease (HCC) 10/29/2014  . Primary osteoarthritis of right knee 10/29/2014  . DM (diabetes mellitus) type II controlled with renal manifestation (HCC) 10/29/2014  . OSA (obstructive sleep apnea) 10/09/2013  . ILD (interstitial lung disease) (HCC) 10/09/2013  . Cough 10/07/2013  . Dyspnea 10/07/2013  . Osteoarthritis of left knee 08/24/2013  . Benign prostatic hypertrophy 08/24/2013  . Essential hypertension, benign 08/24/2013  . Gout 08/24/2013  . Type II or unspecified type diabetes mellitus without mention of complication, not stated as uncontrolled 08/24/2013  . Chronic idiopathic constipation 04/10/2013  . Dyspnea on exertion 04/10/2013  . Osteoarthrosis, unspecified whether generalized or localized, unspecified site   . Chronic kidney disease, stage III (moderate)   . Obesity, unspecified   .  Gout, unspecified   . Other and unspecified hyperlipidemia   . Essential hypertension   . Hypertrophy of prostate without urinary obstruction and other lower urinary tract symptoms (LUTS)   . Maculopapular rash, generalized 10/02/2012   Past Medical History:  Diagnosis Date  .  Allergy, unspecified not elsewhere classified   . Chronic kidney disease, stage III (moderate)   . External hemorrhoids without mention of complication   . Gout, unspecified   . Hypertrophy of prostate without urinary obstruction and other lower urinary tract symptoms (LUTS)   . Obesity, unspecified   . Organic sleep apnea, unspecified   . Osteoarthrosis, unspecified whether generalized or localized, unspecified site   . Other abnormal glucose   . Other and unspecified hyperlipidemia   . Other atopic dermatitis and related conditions   . Other specified visual disturbances   . Pain in joint, lower leg   . Pain in limb   . Unspecified disorder of kidney and ureter   . Unspecified essential hypertension   . Urticaria, unspecified    Past Surgical History:  Procedure Laterality Date  . CATARACT EXTRACTION       MEDICATIONS PRIOR TO ADMISSION: No current facility-administered medications for this encounter.   Current Outpatient Prescriptions:  .  allopurinol (ZYLOPRIM) 100 MG tablet, Take 1 tablet (100 mg total) by mouth daily. For gout, Disp: 90 tablet, Rfl: 1 .  atorvastatin (LIPITOR) 80 MG tablet, , Disp: , Rfl:  .  CALCIUM PO, Take 1 tablet by mouth daily., Disp: , Rfl:  .  cetirizine (KLS ALLER-TEC) 10 MG tablet, Take 10 mg by mouth daily PRN., Disp: , Rfl:  .  Cholecalciferol (VITAMIN D3) 2000 units TABS, Take by mouth daily., Disp: , Rfl:  .  furosemide (LASIX) 20 MG tablet, Take 1 tablet (20 mg total) by mouth daily. For swelling of legs, Disp: 90 tablet, Rfl: 1 .  lisinopril (PRINIVIL,ZESTRIL) 10 MG tablet, Take 1 tablet (10 mg total) by mouth daily. To control blood pressure, Disp: 90 tablet, Rfl: 3 .  losartan (COZAAR) 100 MG tablet, , Disp: , Rfl:  .  metoprolol succinate (TOPROL-XL) 100 MG 24 hr tablet, TAKE 1 TABLET (100 MG TOTAL) BY MOUTH DAILY., Disp: 30 tablet, Rfl: 4 .  tamsulosin (FLOMAX) 0.4 MG CAPS capsule, TAKE 1 CAPSULE BY MOUTH ONCE A DAY FOR YOUR PROSTATE,  Disp: 90 capsule, Rfl: 1 .  triamcinolone (KENALOG) 0.025 % ointment, Apply 1 application topically 2 (two) times daily., Disp: 45 g, Rfl: 3 .  vitamin B-12 (CYANOCOBALAMIN) 1000 MCG tablet, Take 1,000 mcg by mouth daily., Disp: , Rfl:    ALLERGIES:  No Known Allergies  REVIEW OF SYSTEMS:  Review of Systems  Cardiovascular:       HYPERTENSION  Musculoskeletal:       GOUT     FAMILY HISTORY:  No family history on file.  SOCIAL HISTORY:   Social History   Occupational History  . retired    Social History Main Topics  . Smoking status: Former Smoker    Packs/day: 0.50    Years: 15.00    Types: Cigarettes    Quit date: 04/30/1993  . Smokeless tobacco: Former Neurosurgeon    Types: Chew  . Alcohol use Yes     Comment: occassional  . Drug use: No  . Sexual activity: Not on file     EXAMINATION:  Vital signs in last 24 hours: BP (!) 158/71   Pulse 63   Temp 97.9 F (36.6  C)   Resp 16   Ht 5\' 4"  (1.626 m)   Wt 172 lb (78 kg)   BMI 29.52 kg/m    Physical Exam  Constitutional: He is oriented to person, place, and time. He appears well-developed and well-nourished.  HENT:  Head: Normocephalic and atraumatic.  Eyes: Pupils are equal, round, and reactive to light. Conjunctivae and EOM are normal.  Neck: Neck supple.  Cardiovascular: Normal rate, regular rhythm, normal heart sounds and intact distal pulses.   Pulmonary/Chest: Effort normal and breath sounds normal.  Abdominal: Soft. Bowel sounds are normal. There is no tenderness.  Neurological: He is alert and oriented to person, place, and time. No cranial nerve deficit.  Skin: Skin is warm and dry.  Psychiatric: He has a normal mood and affect. His behavior is normal. Judgment and thought content normal.   Ortho Exam  Range of motion from near full extension to 120. Some patellofemoral crepitance is noted. Trace effusion. Ligamentously stable.  Imaging Review Plain radiographs demonstrate moderate degenerative joint  disease of the right knee. The overall alignment is neutral. The bone quality appears to be good for age and reported activity level.  ASSESSMENT: End stage arthritis, right knee  Past Medical History:  Diagnosis Date  . Allergy, unspecified not elsewhere classified   . Chronic kidney disease, stage III (moderate)   . External hemorrhoids without mention of complication   . Gout, unspecified   . Hypertrophy of prostate without urinary obstruction and other lower urinary tract symptoms (LUTS)   . Obesity, unspecified   . Organic sleep apnea, unspecified   . Osteoarthrosis, unspecified whether generalized or localized, unspecified site   . Other abnormal glucose   . Other and unspecified hyperlipidemia   . Other atopic dermatitis and related conditions   . Other specified visual disturbances   . Pain in joint, lower leg   . Pain in limb   . Unspecified disorder of kidney and ureter   . Unspecified essential hypertension   . Urticaria, unspecified     PLAN: Plan for right total knee replacement.  The patient history, physical examination and imaging studies are consistent with moderate degenerative joint disease of the right knee. The patient has failed conservative treatment.  The clearance notes were reviewed.  After discussion with the patient it was felt that Total Knee Replacement was indicated. The procedure,  risks, and benefits of total knee arthroplasty were presented and reviewed. The risks including but not limited to aseptic loosening, infection, blood clots, vascular and nerve injury, stiffness, patella tracking problems and fracture complications among others were discussed. The patient acknowledged the explanation, agreed to proceed with total knee replacement.  Oris Drone Aleda Grana [facility] [phone]  11/14/2016 1:42 PM

## 2016-11-14 NOTE — H&P (Signed)
Stanley CampbellPeter Whitfield, MD   Jacqualine CodeBrian Meital Riehl, PA-C [address], [place], [place]  [address]                             [phone]   ORTHOPAEDIC HISTORY & PHYSICAL  Stanley HugueninManilal N Starke MRN:  [id number] DOB/SEX:  04/04/1941/male  CHIEF COMPLAINT:  Painful right Knee  HISTORY:  Mr. Stanley Sullivan is a very pleasant 76 year old male who is seen today for history of right knee pain for the last 3-4 years. He had a gradual onset of pain discomfort at that time and had gradual worsening. He did not have any other surgical procedures to the knee noted. History conservatively with over-the-counter NSAIDs and activity modification. His pain was rated at the 4-5 out of 10 with activity. He did also have nighttime pain. He has had corticosteroid injections in the past. No Visco supplementation though.  MRI scan had been performed and revealed FINDINGS: MENISCI Medial meniscus: Radial tear of the posterior horn of the medial meniscus adjacent to the meniscal root. Radial tear of the posterior horn- body of the medial meniscus. Lateral meniscus:  Intact.  LIGAMENTS Cruciates: Intact ACL and PCL. Increased signal and expansion of the ACL as can be seen with mucinous degeneration.  CARTILAGE Patellofemoral: Full-thickness cartilage loss of the lateral patellar facet. Medial: Full-thickness cartilage loss of the medial femorotibial compartment with subchondral reactive marrow changes. Lateral: Small focal area of cartilage fissuring involving the lateral femoral condyle.  Joint: Large joint effusion. Normal Hoffa's fat. No plical thickening. Popliteal Fossa:  No Baker cyst.  Intact popliteus tendon. Extensor Mechanism:  Intact quadriceps tendon and patellar tendon. Bones: No other marrow signal abnormality. No fracture or Dislocation.   He had been previously scheduled for a right total knee replacement in January but they canceled. He is now to the point where he is having significant pain and  discomfort and would like to proceed with this surgery in the near future.   Patient is a 76 y.o. male presented with a history of pain in the right knee for 3 years. Onset of symptoms was gradual starting 3 years ago with gradually worsening course since that time. Prior procedures on the knee are none. Patient has been treated conservatively with over-the-counter NSAIDs and activity modification. Patient currently rates pain in the knee at 4 out of 10 with activity. There is pain at night. present.  They have been previously treated with: NSAIDS: could not take because of stage 3 CKD  Knee injection with corticosteroid  was performed Knee injection with visco supplementation was not performed Medications:  none  PAST MEDICAL HISTORY: Patient Active Problem List   Diagnosis Date Noted  . Interstitial lung disease (HCC) 10/29/2014  . Primary osteoarthritis of right knee 10/29/2014  . DM (diabetes mellitus) type II controlled with renal manifestation (HCC) 10/29/2014  . OSA (obstructive sleep apnea) 10/09/2013  . ILD (interstitial lung disease) (HCC) 10/09/2013  . Cough 10/07/2013  . Dyspnea 10/07/2013  . Osteoarthritis of left knee 08/24/2013  . Benign prostatic hypertrophy 08/24/2013  . Essential hypertension, benign 08/24/2013  . Gout 08/24/2013  . Type II or unspecified type diabetes mellitus without mention of complication, not stated as uncontrolled 08/24/2013  . Chronic idiopathic constipation 04/10/2013  . Dyspnea on exertion 04/10/2013  . Osteoarthrosis, unspecified whether generalized or localized, unspecified site   . Chronic kidney disease, stage III (moderate)   . Obesity, unspecified   .  Gout, unspecified   . Other and unspecified hyperlipidemia   . Essential hypertension   . Hypertrophy of prostate without urinary obstruction and other lower urinary tract symptoms (LUTS)   . Maculopapular rash, generalized 10/02/2012   Past Medical History:  Diagnosis Date  .  Allergy, unspecified not elsewhere classified   . Chronic kidney disease, stage III (moderate)   . External hemorrhoids without mention of complication   . Gout, unspecified   . Hypertension   . Hypertrophy of prostate without urinary obstruction and other lower urinary tract symptoms (LUTS)   . Obesity, unspecified   . Organic sleep apnea, unspecified   . Osteoarthrosis, unspecified whether generalized or localized, unspecified site   . Other abnormal glucose   . Other and unspecified hyperlipidemia   . Other atopic dermatitis and related conditions   . Other specified visual disturbances   . Pain in joint, lower leg   . Pain in limb   . Unspecified disorder of kidney and ureter   . Unspecified essential hypertension   . Urticaria, unspecified    Past Surgical History:  Procedure Laterality Date  . CATARACT EXTRACTION       MEDICATIONS PRIOR TO ADMISSION: No current facility-administered medications for this encounter.   Current Outpatient Prescriptions:  .  allopurinol (ZYLOPRIM) 100 MG tablet, Take 1 tablet (100 mg total) by mouth daily. For gout, Disp: 90 tablet, Rfl: 1 .  atorvastatin (LIPITOR) 80 MG tablet, , Disp: , Rfl:  .  CALCIUM PO, Take 1 tablet by mouth daily., Disp: , Rfl:  .  cetirizine (KLS ALLER-TEC) 10 MG tablet, Take 10 mg by mouth daily PRN., Disp: , Rfl:  .  Cholecalciferol (VITAMIN D3) 2000 units TABS, Take by mouth daily., Disp: , Rfl:  .  furosemide (LASIX) 20 MG tablet, Take 1 tablet (20 mg total) by mouth daily. For swelling of legs, Disp: 90 tablet, Rfl: 1 .  lisinopril (PRINIVIL,ZESTRIL) 10 MG tablet, Take 1 tablet (10 mg total) by mouth daily. To control blood pressure, Disp: 90 tablet, Rfl: 3 .  losartan (COZAAR) 100 MG tablet, , Disp: , Rfl:  .  metoprolol succinate (TOPROL-XL) 100 MG 24 hr tablet, TAKE 1 TABLET (100 MG TOTAL) BY MOUTH DAILY., Disp: 30 tablet, Rfl: 4 .  tamsulosin (FLOMAX) 0.4 MG CAPS capsule, TAKE 1 CAPSULE BY MOUTH ONCE A DAY FOR  YOUR PROSTATE, Disp: 90 capsule, Rfl: 1 .  triamcinolone (KENALOG) 0.025 % ointment, Apply 1 application topically 2 (two) times daily., Disp: 45 g, Rfl: 3 .  vitamin B-12 (CYANOCOBALAMIN) 1000 MCG tablet, Take 1,000 mcg by mouth daily., Disp: , Rfl:    ALLERGIES:  No Known Allergies  REVIEW OF SYSTEMS:  Review of Systems  Cardiovascular:       HYPERTENSION  Musculoskeletal:       GOUT     FAMILY HISTORY:  No family history on file.  SOCIAL HISTORY:   Social History   Occupational History  . retired    Social History Main Topics  . Smoking status: Former Smoker    Packs/day: 0.50    Years: 15.00    Types: Cigarettes    Quit date: 04/30/1993  . Smokeless tobacco: Former Neurosurgeon    Types: Chew  . Alcohol use Yes     Comment: occassional  . Drug use: No  . Sexual activity: Not on file     EXAMINATION:  Vital signs in last 24 hours: BP (!) 158/71   Pulse 63  Temp 97.9 F (36.6 C)   Resp 16   Ht 5\' 4"  (1.626 m)   Wt 172 lb (78 kg)   BMI 29.52 kg/m   Physical Exam  Constitutional: He is oriented to person, place, and time. He appears well-developed and well-nourished.  HENT:  Head: Normocephalic and atraumatic.  Eyes: Pupils are equal, round, and reactive to light. Conjunctivae and EOM are normal.  Neck: Neck supple.  Cardiovascular: Normal rate, regular rhythm, normal heart sounds and intact distal pulses.   Pulmonary/Chest: Effort normal and breath sounds normal.  Abdominal: Soft. Bowel sounds are normal. There is no tenderness.  Neurological: He is alert and oriented to person, place, and time. No cranial nerve deficit.  Skin: Skin is warm and dry.  Psychiatric: He has a normal mood and affect. His behavior is normal. Judgment and thought content normal.   Ortho Exam  Range of motion from near full extension to 120. Some patellofemoral crepitance is noted. Trace effusion. Ligamentously stable.  Imaging Review Plain radiographs demonstrate moderate  degenerative joint disease of the right knee. The overall alignment is neutral. The bone quality appears to be good for age and reported activity level.  ASSESSMENT: End stage arthritis, right knee  Past Medical History:  Diagnosis Date  . Allergy, unspecified not elsewhere classified   . Chronic kidney disease, stage III (moderate)   . External hemorrhoids without mention of complication   . Gout, unspecified   . Hypertension   . Hypertrophy of prostate without urinary obstruction and other lower urinary tract symptoms (LUTS)   . Obesity, unspecified   . Organic sleep apnea, unspecified   . Osteoarthrosis, unspecified whether generalized or localized, unspecified site   . Other abnormal glucose   . Other and unspecified hyperlipidemia   . Other atopic dermatitis and related conditions   . Other specified visual disturbances   . Pain in joint, lower leg   . Pain in limb   . Unspecified disorder of kidney and ureter   . Unspecified essential hypertension   . Urticaria, unspecified     PLAN: Plan for right total knee replacement.  The patient history, physical examination and imaging studies are consistent with moderate degenerative joint disease of the right knee. The patient has failed conservative treatment.  The clearance notes were reviewed.  After discussion with the patient it was felt that Total Knee Replacement was indicated. The procedure,  risks, and benefits of total knee arthroplasty were presented and reviewed. The risks including but not limited to aseptic loosening, infection, blood clots, vascular and nerve injury, stiffness, patella tracking problems and fracture complications among others were discussed. The patient acknowledged the explanation, agreed to proceed with total knee replacement.  Oris DroneBrian D. Aleda Granaetrarca, PA-C [facility] [phone]  11/14/2016 3:26 PM

## 2016-11-23 NOTE — Pre-Procedure Instructions (Signed)
Stanley Sullivan  11/23/2016      Stanley Sullivan [id number] [place] [address] [place] [address] [place] [place] [address] Phone: [phone] Fax: [phone]    Your procedure is scheduled on Tuesday August 7.  [facility].M.  Call this number if you have problems the morning of surgery:  [phone]   Remember:  Do not eat food or drink liquids after midnight.  Please continue to take all prescribed medications as instructed by your doctor except for the following medication instructions:    Take these medicines the morning of surgery with A SIP OF WATER: metoprolol (Toprol-XL0, tamsulosin (flomax), allopurinol (zyloprim), cetirizine if needed, acetaminophen (tylenol) if needed, albuterol if needed  Please bring inhaler to hospital with you  7 days prior to surgery STOP taking any Aspirin, Aleve, Naproxen, Ibuprofen, Motrin, Advil, Goody's, BC's, all herbal medications, fish oil, and all vitamins    Do not wear jewelry  Do not wear lotions, powders, or colognes, or deoderant.  Men may shave face and neck.  Do not bring valuables to the hospital.  [facility] is not responsible for any belongings or valuables.  Contacts, dentures or bridgework may not be worn into surgery.  Leave your suitcase in the car.  After surgery it may be brought to your room.  For patients admitted to the hospital, discharge time will be determined by your treatment team.  Patients discharged the day of surgery will not be allowed to drive home.    Special instructions:    [place]- Preparing For Surgery  Before surgery, you can play an important role. Because skin is not sterile, your skin needs to be as free of germs as possible. You can reduce the number of germs on your skin by washing with CHG (chlorahexidine gluconate) Soap before surgery.  CHG is an antiseptic cleaner which kills germs and bonds with the skin to continue  killing germs even after washing.  Please do not use if you have an allergy to CHG or antibacterial soaps. If your skin becomes reddened/irritated stop using the CHG.  Do not shave (including legs and underarms) for at least 48 hours prior to first CHG shower. It is OK to shave your face.  Please follow these instructions carefully.   1. Shower the NIGHT BEFORE SURGERY and the MORNING OF SURGERY with CHG.   2. If you chose to wash your hair, wash your hair first as usual with your normal shampoo.  3. After you shampoo, rinse your hair and body thoroughly to remove the shampoo.  4. Use CHG as you would any other liquid soap. You can apply CHG directly to the skin and wash gently with a scrungie or a clean washcloth.   5. Apply the CHG Soap to your body ONLY FROM THE NECK DOWN.  Do not use on open wounds or open sores. Avoid contact with your eyes, ears, mouth and genitals (private parts). Wash genitals (private parts) with your normal soap.  6. Wash thoroughly, paying special attention to the area where your surgery will be performed.  7. Thoroughly rinse your body with warm water from the neck down.  8. DO NOT shower/wash with your normal soap after using and rinsing off the CHG Soap.  9. Pat yourself dry with a CLEAN TOWEL.   10. Wear CLEAN PAJAMAS   11. Place CLEAN SHEETS on your bed the night of your first  shower and DO NOT SLEEP WITH PETS.    Day of Surgery: Do not apply any deodorants/lotions. Please wear clean clothes to the hospital/surgery center.      Please read over the following fact sheets that you were given. Total Joint Packet

## 2016-11-24 DIAGNOSIS — J8410 Pulmonary fibrosis, unspecified: Secondary | ICD-10-CM | POA: Diagnosis not present

## 2016-11-26 ENCOUNTER — Encounter (HOSPITAL_COMMUNITY)
Admission: RE | Admit: 2016-11-26 | Discharge: 2016-11-26 | Disposition: A | Discharge status: Home / Self Care | Payer: Medicare Other | Source: Ambulatory Visit | Attending: Orthopaedic Surgery | Admitting: Orthopaedic Surgery

## 2016-11-26 ENCOUNTER — Telehealth (INDEPENDENT_AMBULATORY_CARE_PROVIDER_SITE_OTHER): Payer: Self-pay | Admitting: Orthopedic Surgery

## 2016-11-26 ENCOUNTER — Encounter (HOSPITAL_COMMUNITY): Payer: Self-pay

## 2016-11-26 DIAGNOSIS — Z0183 Encounter for blood typing: Secondary | ICD-10-CM | POA: Insufficient documentation

## 2016-11-26 DIAGNOSIS — Z79899 Other long term (current) drug therapy: Secondary | ICD-10-CM | POA: Diagnosis not present

## 2016-11-26 DIAGNOSIS — Z01812 Encounter for preprocedural laboratory examination: Secondary | ICD-10-CM | POA: Insufficient documentation

## 2016-11-26 DIAGNOSIS — Z87891 Personal history of nicotine dependence: Secondary | ICD-10-CM | POA: Diagnosis not present

## 2016-11-26 DIAGNOSIS — Z01818 Encounter for other preprocedural examination: Secondary | ICD-10-CM | POA: Insufficient documentation

## 2016-11-26 DIAGNOSIS — I129 Hypertensive chronic kidney disease with stage 1 through stage 4 chronic kidney disease, or unspecified chronic kidney disease: Secondary | ICD-10-CM | POA: Insufficient documentation

## 2016-11-26 DIAGNOSIS — J849 Interstitial pulmonary disease, unspecified: Secondary | ICD-10-CM | POA: Diagnosis not present

## 2016-11-26 DIAGNOSIS — M1711 Unilateral primary osteoarthritis, right knee: Secondary | ICD-10-CM | POA: Insufficient documentation

## 2016-11-26 DIAGNOSIS — E785 Hyperlipidemia, unspecified: Secondary | ICD-10-CM | POA: Diagnosis not present

## 2016-11-26 DIAGNOSIS — Z9981 Dependence on supplemental oxygen: Secondary | ICD-10-CM | POA: Insufficient documentation

## 2016-11-26 DIAGNOSIS — G4733 Obstructive sleep apnea (adult) (pediatric): Secondary | ICD-10-CM | POA: Diagnosis not present

## 2016-11-26 DIAGNOSIS — N183 Chronic kidney disease, stage 3 (moderate): Secondary | ICD-10-CM | POA: Insufficient documentation

## 2016-11-26 HISTORY — DX: Dependence on supplemental oxygen: Z99.81

## 2016-11-26 LAB — COMPREHENSIVE METABOLIC PANEL
ALT: 16 U/L — AB (ref 17–63)
AST: 24 U/L (ref 15–41)
Albumin: 3.6 g/dL (ref 3.5–5.0)
Alkaline Phosphatase: 61 U/L (ref 38–126)
Anion gap: 7 (ref 5–15)
BILIRUBIN TOTAL: 1.3 mg/dL — AB (ref 0.3–1.2)
BUN: 19 mg/dL (ref 6–20)
CO2: 21 mmol/L — ABNORMAL LOW (ref 22–32)
CREATININE: 1.9 mg/dL — AB (ref 0.61–1.24)
Calcium: 8.8 mg/dL — ABNORMAL LOW (ref 8.9–10.3)
Chloride: 108 mmol/L (ref 101–111)
GFR calc Af Amer: 38 mL/min — ABNORMAL LOW (ref >60)
GFR, EST NON AFRICAN AMERICAN: 32 mL/min — AB (ref >60)
Glucose, Bld: 106 mg/dL — ABNORMAL HIGH (ref 65–99)
Potassium: 4.7 mmol/L (ref 3.5–5.1)
Sodium: 136 mmol/L (ref 135–145)
TOTAL PROTEIN: 6.3 g/dL — AB (ref 6.5–8.1)

## 2016-11-26 LAB — CBC WITH DIFFERENTIAL/PLATELET
BASOS PCT: 0 %
Basophils Absolute: 0 10*3/uL (ref 0.0–0.1)
EOS PCT: 6 %
Eosinophils Absolute: 0.5 10*3/uL (ref 0.0–0.7)
HEMATOCRIT: 46.2 % (ref 39.0–52.0)
Hemoglobin: 15.2 g/dL (ref 13.0–17.0)
LYMPHS ABS: 1.8 10*3/uL (ref 0.7–4.0)
Lymphocytes Relative: 21 %
MCH: 23.8 pg — ABNORMAL LOW (ref 26.0–34.0)
MCHC: 32.9 g/dL (ref 30.0–36.0)
MCV: 72.3 fL — AB (ref 78.0–100.0)
MONOS PCT: 11 %
Monocytes Absolute: 1 10*3/uL (ref 0.1–1.0)
NEUTROS PCT: 62 %
Neutro Abs: 5.4 10*3/uL (ref 1.7–7.7)
Platelets: 167 10*3/uL (ref 150–400)
RBC: 6.39 MIL/uL — ABNORMAL HIGH (ref 4.22–5.81)
RDW: 15.6 % — ABNORMAL HIGH (ref 11.5–15.5)
WBC: 8.7 10*3/uL (ref 4.0–10.5)

## 2016-11-26 LAB — URINALYSIS, ROUTINE W REFLEX MICROSCOPIC
Bilirubin Urine: NEGATIVE
Glucose, UA: NEGATIVE mg/dL
Hgb urine dipstick: NEGATIVE
KETONES UR: NEGATIVE mg/dL
LEUKOCYTES UA: NEGATIVE
NITRITE: NEGATIVE
PH: 5 (ref 5.0–8.0)
Protein, ur: NEGATIVE mg/dL
SPECIFIC GRAVITY, URINE: 1.004 — AB (ref 1.005–1.030)

## 2016-11-26 LAB — SURGICAL PCR SCREEN
MRSA, PCR: NEGATIVE
STAPHYLOCOCCUS AUREUS: NEGATIVE

## 2016-11-26 LAB — APTT: APTT: 29 s (ref 24–36)

## 2016-11-26 LAB — TYPE AND SCREEN
ABO/RH(D): A POS
Antibody Screen: NEGATIVE

## 2016-11-26 LAB — PROTIME-INR
INR: 1.12
PROTHROMBIN TIME: 14.5 s (ref 11.4–15.2)

## 2016-11-26 LAB — ABO/RH: ABO/RH(D): A POS

## 2016-11-26 NOTE — Progress Notes (Addendum)
PCP - Stanley Sullivan, saw Stanley Sullivan in the past- last office note, CXR and EKG tracing requested Cardiologist - Stanley Sullivan, last office note and EKG tracing requested.  Pulmonary- Dr. Marchelle Gearingamaswamy   Chest x-ray - 06/26/16- requested from [facility] EKG - 03/12/16 under media tab Stress Test - 05/11/13 ECHO - 04/16/13   Sleep Study - done at [place] per patient wife CPAP - pressure setting 3 Patient wears oxygen PRN at home for O2 sat in 80s. Patient states he uses this with exertion and in the morning/evening.   Pt BP elevated at PAT appointment, to take BP medicine when he gets home.  Chart forwarded to anesthesia for review.   Patient denies shortness of breath, fever, cough and chest pain at PAT appointment   Patient verbalized understanding of instructions that were given to them at the PAT appointment. Patient was also instructed that they will need to review over the PAT instructions again at home before surgery.  Patient wife was present for PAT appointment and interpreted for patient. Patient speaks little [place], primary language Gujarati. Son [name] to come day of surgery to interpret for patient.

## 2016-11-26 NOTE — Telephone Encounter (Signed)
ABNORMAL LABS.  ELEVATED CRET, GFR LOW.  SPOKE TO DR ARVIND'S OFFICE AND THEY SAID HE STILL WAS CLEARED FOR SURGERY.  TOLD Stanley Sullivan TO DRINK MORE FLUIDS BEFORE SURGERY

## 2016-11-27 LAB — URINE CULTURE: Culture: <10000 — AB

## 2016-11-27 NOTE — Progress Notes (Signed)
Anesthesia Chart Review:  Pt is a 76 year old male scheduled for R total knee arthroplasty on 12/04/2016 with Norlene CampbellPeter Whitfield, MD  - PCP is Barney DrainMoogali Arvind, MD who cleared pt for surgery.  - Was evaluated by cardiologist Delrae RendJagadeesh Ganji, MD for dyspnea at pulmonology request in 2014/2015.  Last office visit note 03/14/16 recommends prn cardiology f/u.   - Pulmonologist is Kalman ShanMurali Ramaswamy, MD who cleared pt for surgery at low-intermediate risk for pulmonary complications at last office visit 03/26/16; he notes "Post op care in Step down or tele with Pulmonary consultation"   PMH includes:  HTN, hyperlipidemia, OSA, interstitial lung disease with exertional hypoxemia (cause unknown at this time), CKD (stage III).  Uses home O2 in morning and at night prn.  Former smoker. BMI 27  Medications include: Albuterol, Lipitor, Lasix, losartan, metoprolol  BP (!) 160/76   Pulse 65   Temp 36.4 C   Resp 18   Ht 5' 6.5" (1.689 m)   Wt 170 lb (77.1 kg)   SpO2 91%   BMI 27.03 kg/m    Preoperative labs reviewed.   - Cr 1.9, BUN 19.  Baseline Cr appears to be ~1.5.  Jacqualine CodeBrian Petrarca, PA in Dr. Hoy RegisterWhitfield's office, spoke with PCP about elevated Cr and Dr. Kathrynn SpeedArvind felt pt could proceed with surgery as scheduled.   CT chest 07/03/16 [facility]): Infiltrate or scarring posterior segment of R upper lobe.   CXR 06/26/16 [facility]):  1. B bronchial changes. 2. Hyperinflation. 3. Chest CT with IV contrast recommended to evaluate cough x1 month  EKG 03/14/16 [facility]): NSR  Nuclear stress test 05/11/13:  1. Resting EKG NSR, poor R wave progression. Stress EKG nondiagnostic for ischemia. No ST-T changes of ischemia noted with pharmacologic stress testing. Stress symptoms included dyspnea. Stress terminated due to completion of protocol 2. Perfusion study demonstrates normal isotope uptake both at rest and stress. There was no evidence of ischemia or scar. Dynamic gated  images reveal normal wall motion and endocardial thickening. LVEF estimated at 58%  Echo 04/06/13:  1. LV cavity normal in size. Mild concentric LVH. Normal global wall motion. Normal systolic global function. Calculated EF 50%. Doppler evidence of grade II diastolic dysfunction. 2. LA cavity mildly dilated. 3. Mild aortic valve leaflet calcification. 4. Mitral valve structurally normal. Mild mitral regurgitation. 5. Tricuspid valve structurally normal. Mild tricuspid regurgitation.  If no changes, I anticipate pt can proceed with surgery as scheduled.   Rica Mastngela Dewon Mendizabal, FNP-BC [facility]/Anesthesiology Phone: [phone] 11/27/2016 2:50 PM

## 2016-12-03 MED ORDER — TRANEXAMIC ACID 1000 MG/10ML IV SOLN
2000.0000 mg | INTRAVENOUS | Status: DC
  Filled 2016-12-03: qty 20

## 2016-12-03 MED ORDER — CEFAZOLIN SODIUM-DEXTROSE 2-4 GM/100ML-% IV SOLN
2.0000 g | INTRAVENOUS | Status: AC
  Administered 2016-12-04: 2 g via INTRAVENOUS
  Filled 2016-12-03: qty 100

## 2016-12-03 MED ORDER — ACETAMINOPHEN 10 MG/ML IV SOLN
1000.0000 mg | Freq: Once | INTRAVENOUS | Status: AC
  Administered 2016-12-04: 1000 mg via INTRAVENOUS
  Filled 2016-12-03: qty 100

## 2016-12-03 MED ORDER — SODIUM CHLORIDE 0.9 % IV SOLN: INTRAVENOUS | Status: DC

## 2016-12-04 ENCOUNTER — Inpatient Hospital Stay (HOSPITAL_COMMUNITY)
Admission: RE | Admit: 2016-12-04 | Discharge: 2016-12-06 | DRG: 470 | Disposition: A | Discharge status: Skilled Nursing Facility | Payer: Medicare Other | Source: Ambulatory Visit | Attending: Orthopaedic Surgery | Admitting: Orthopaedic Surgery

## 2016-12-04 ENCOUNTER — Encounter (HOSPITAL_COMMUNITY)
Admission: RE | Disposition: A | Discharge status: Skilled Nursing Facility | Payer: Self-pay | Source: Ambulatory Visit | Attending: Orthopaedic Surgery

## 2016-12-04 ENCOUNTER — Inpatient Hospital Stay (HOSPITAL_COMMUNITY): Payer: Medicare Other | Admitting: Emergency Medicine

## 2016-12-04 ENCOUNTER — Encounter (HOSPITAL_COMMUNITY): Payer: Self-pay | Admitting: Certified Registered"

## 2016-12-04 ENCOUNTER — Inpatient Hospital Stay (HOSPITAL_COMMUNITY): Payer: Medicare Other

## 2016-12-04 DIAGNOSIS — R0902 Hypoxemia: Secondary | ICD-10-CM | POA: Diagnosis not present

## 2016-12-04 DIAGNOSIS — J9611 Chronic respiratory failure with hypoxia: Secondary | ICD-10-CM | POA: Diagnosis present

## 2016-12-04 DIAGNOSIS — E1122 Type 2 diabetes mellitus with diabetic chronic kidney disease: Secondary | ICD-10-CM | POA: Diagnosis present

## 2016-12-04 DIAGNOSIS — Z87891 Personal history of nicotine dependence: Secondary | ICD-10-CM | POA: Diagnosis not present

## 2016-12-04 DIAGNOSIS — R05 Cough: Secondary | ICD-10-CM

## 2016-12-04 DIAGNOSIS — I5032 Chronic diastolic (congestive) heart failure: Secondary | ICD-10-CM | POA: Diagnosis present

## 2016-12-04 DIAGNOSIS — M6281 Muscle weakness (generalized): Secondary | ICD-10-CM | POA: Diagnosis not present

## 2016-12-04 DIAGNOSIS — E876 Hypokalemia: Secondary | ICD-10-CM | POA: Diagnosis not present

## 2016-12-04 DIAGNOSIS — R262 Difficulty in walking, not elsewhere classified: Secondary | ICD-10-CM | POA: Diagnosis not present

## 2016-12-04 DIAGNOSIS — Z96652 Presence of left artificial knee joint: Secondary | ICD-10-CM

## 2016-12-04 DIAGNOSIS — E875 Hyperkalemia: Secondary | ICD-10-CM | POA: Diagnosis present

## 2016-12-04 DIAGNOSIS — E785 Hyperlipidemia, unspecified: Secondary | ICD-10-CM | POA: Diagnosis not present

## 2016-12-04 DIAGNOSIS — G4733 Obstructive sleep apnea (adult) (pediatric): Secondary | ICD-10-CM | POA: Diagnosis not present

## 2016-12-04 DIAGNOSIS — I129 Hypertensive chronic kidney disease with stage 1 through stage 4 chronic kidney disease, or unspecified chronic kidney disease: Secondary | ICD-10-CM | POA: Diagnosis not present

## 2016-12-04 DIAGNOSIS — N400 Enlarged prostate without lower urinary tract symptoms: Secondary | ICD-10-CM | POA: Diagnosis not present

## 2016-12-04 DIAGNOSIS — Z9981 Dependence on supplemental oxygen: Secondary | ICD-10-CM

## 2016-12-04 DIAGNOSIS — I130 Hypertensive heart and chronic kidney disease with heart failure and stage 1 through stage 4 chronic kidney disease, or unspecified chronic kidney disease: Secondary | ICD-10-CM | POA: Diagnosis present

## 2016-12-04 DIAGNOSIS — M109 Gout, unspecified: Secondary | ICD-10-CM | POA: Diagnosis present

## 2016-12-04 DIAGNOSIS — I10 Essential (primary) hypertension: Secondary | ICD-10-CM | POA: Diagnosis present

## 2016-12-04 DIAGNOSIS — Z79899 Other long term (current) drug therapy: Secondary | ICD-10-CM | POA: Diagnosis not present

## 2016-12-04 DIAGNOSIS — N179 Acute kidney failure, unspecified: Secondary | ICD-10-CM | POA: Diagnosis not present

## 2016-12-04 DIAGNOSIS — M1711 Unilateral primary osteoarthritis, right knee: Secondary | ICD-10-CM | POA: Diagnosis not present

## 2016-12-04 DIAGNOSIS — R059 Cough, unspecified: Secondary | ICD-10-CM

## 2016-12-04 DIAGNOSIS — N183 Chronic kidney disease, stage 3 unspecified: Secondary | ICD-10-CM | POA: Diagnosis present

## 2016-12-04 DIAGNOSIS — R488 Other symbolic dysfunctions: Secondary | ICD-10-CM | POA: Diagnosis not present

## 2016-12-04 DIAGNOSIS — G8918 Other acute postprocedural pain: Secondary | ICD-10-CM | POA: Diagnosis not present

## 2016-12-04 DIAGNOSIS — I9581 Postprocedural hypotension: Secondary | ICD-10-CM | POA: Diagnosis not present

## 2016-12-04 DIAGNOSIS — G4730 Sleep apnea, unspecified: Secondary | ICD-10-CM | POA: Diagnosis present

## 2016-12-04 DIAGNOSIS — R278 Other lack of coordination: Secondary | ICD-10-CM | POA: Diagnosis not present

## 2016-12-04 DIAGNOSIS — Z471 Aftercare following joint replacement surgery: Secondary | ICD-10-CM | POA: Diagnosis not present

## 2016-12-04 HISTORY — DX: Presence of left artificial knee joint: Z96.652

## 2016-12-04 HISTORY — PX: TOTAL KNEE ARTHROPLASTY: SHX125

## 2016-12-04 LAB — BASIC METABOLIC PANEL
Anion gap: 8 (ref 5–15)
BUN: 16 mg/dL (ref 6–20)
CHLORIDE: 111 mmol/L (ref 101–111)
CO2: 20 mmol/L — AB (ref 22–32)
CREATININE: 1.76 mg/dL — AB (ref 0.61–1.24)
Calcium: 8.3 mg/dL — ABNORMAL LOW (ref 8.9–10.3)
GFR calc Af Amer: 41 mL/min — ABNORMAL LOW (ref >60)
GFR calc non Af Amer: 36 mL/min — ABNORMAL LOW (ref >60)
GLUCOSE: 102 mg/dL — AB (ref 65–99)
POTASSIUM: 5 mmol/L (ref 3.5–5.1)
Sodium: 139 mmol/L (ref 135–145)

## 2016-12-04 LAB — CBC
HEMATOCRIT: 43 % (ref 39.0–52.0)
HEMOGLOBIN: 13.4 g/dL (ref 13.0–17.0)
MCH: 22.9 pg — AB (ref 26.0–34.0)
MCHC: 31.2 g/dL (ref 30.0–36.0)
MCV: 73.4 fL — AB (ref 78.0–100.0)
Platelets: 148 10*3/uL — ABNORMAL LOW (ref 150–400)
RBC: 5.86 MIL/uL — AB (ref 4.22–5.81)
RDW: 16.1 % — ABNORMAL HIGH (ref 11.5–15.5)
WBC: 10.1 10*3/uL (ref 4.0–10.5)

## 2016-12-04 LAB — GLUCOSE, CAPILLARY: GLUCOSE-CAPILLARY: 138 mg/dL — AB (ref 65–99)

## 2016-12-04 SURGERY — ARTHROPLASTY, KNEE, TOTAL
Anesthesia: Spinal | Site: Knee | Laterality: Right

## 2016-12-04 MED ORDER — POLYETHYLENE GLYCOL 3350 17 G PO PACK
17.0000 g | PACK | Freq: Every day | ORAL | Status: DC | PRN
  Administered 2016-12-05: 17 g via ORAL
  Filled 2016-12-04: qty 1

## 2016-12-04 MED ORDER — ACETAMINOPHEN 10 MG/ML IV SOLN
1000.0000 mg | Freq: Four times a day (QID) | INTRAVENOUS | Status: AC
  Administered 2016-12-04 – 2016-12-05 (×4): 1000 mg via INTRAVENOUS
  Filled 2016-12-04 (×4): qty 100

## 2016-12-04 MED ORDER — ONDANSETRON HCL 4 MG/2ML IJ SOLN
INTRAMUSCULAR | Status: AC
  Filled 2016-12-04: qty 2

## 2016-12-04 MED ORDER — DOCUSATE SODIUM 100 MG PO CAPS
100.0000 mg | ORAL_CAPSULE | Freq: Two times a day (BID) | ORAL | Status: DC
  Administered 2016-12-05 – 2016-12-06 (×3): 100 mg via ORAL
  Filled 2016-12-04 (×4): qty 1

## 2016-12-04 MED ORDER — ALBUTEROL SULFATE (2.5 MG/3ML) 0.083% IN NEBU: 2.5000 mg | INHALATION_SOLUTION | Freq: Four times a day (QID) | RESPIRATORY_TRACT | Status: DC | PRN

## 2016-12-04 MED ORDER — PHENYLEPHRINE HCL 10 MG/ML IJ SOLN
INTRAMUSCULAR | Status: DC | PRN
  Administered 2016-12-04 (×3): 120 ug via INTRAVENOUS

## 2016-12-04 MED ORDER — BUPIVACAINE IN DEXTROSE 0.75-8.25 % IT SOLN
INTRATHECAL | Status: DC | PRN
  Administered 2016-12-04: 1.8 mL via INTRATHECAL

## 2016-12-04 MED ORDER — FENTANYL CITRATE (PF) 250 MCG/5ML IJ SOLN
INTRAMUSCULAR | Status: AC
  Filled 2016-12-04: qty 5

## 2016-12-04 MED ORDER — METHOCARBAMOL 500 MG PO TABS
ORAL_TABLET | ORAL | Status: AC
  Administered 2016-12-04: 500 mg via ORAL
  Filled 2016-12-04: qty 1

## 2016-12-04 MED ORDER — OXYCODONE HCL 5 MG PO TABS
5.0000 mg | ORAL_TABLET | ORAL | Status: DC | PRN
  Administered 2016-12-04 (×3): 10 mg via ORAL
  Filled 2016-12-04: qty 2

## 2016-12-04 MED ORDER — ALLOPURINOL 100 MG PO TABS
100.0000 mg | ORAL_TABLET | Freq: Every day | ORAL | Status: DC
  Administered 2016-12-05 – 2016-12-06 (×2): 100 mg via ORAL
  Filled 2016-12-04 (×2): qty 1

## 2016-12-04 MED ORDER — RIVAROXABAN 10 MG PO TABS: 10.0000 mg | ORAL_TABLET | Freq: Every day | ORAL | Status: DC

## 2016-12-04 MED ORDER — OXYCODONE HCL 5 MG PO TABS
ORAL_TABLET | ORAL | Status: AC
  Administered 2016-12-04: 10 mg via ORAL
  Filled 2016-12-04: qty 2

## 2016-12-04 MED ORDER — ALUM & MAG HYDROXIDE-SIMETH 200-200-20 MG/5ML PO SUSP: 30.0000 mL | ORAL | Status: DC | PRN

## 2016-12-04 MED ORDER — ASPIRIN 325 MG PO TABS
325.0000 mg | ORAL_TABLET | Freq: Two times a day (BID) | ORAL | Status: DC
  Administered 2016-12-05 – 2016-12-06 (×3): 325 mg via ORAL
  Filled 2016-12-04 (×3): qty 1

## 2016-12-04 MED ORDER — BUPIVACAINE-EPINEPHRINE 0.25% -1:200000 IJ SOLN
INTRAMUSCULAR | Status: DC | PRN
  Administered 2016-12-04: 30 mL

## 2016-12-04 MED ORDER — ATORVASTATIN CALCIUM 80 MG PO TABS
80.0000 mg | ORAL_TABLET | Freq: Every day | ORAL | Status: DC
  Administered 2016-12-05 – 2016-12-06 (×2): 80 mg via ORAL
  Filled 2016-12-04 (×2): qty 1

## 2016-12-04 MED ORDER — PHENOL 1.4 % MT LIQD: 1.0000 | OROMUCOSAL | Status: DC | PRN

## 2016-12-04 MED ORDER — MIDAZOLAM HCL 2 MG/2ML IJ SOLN
INTRAMUSCULAR | Status: DC | PRN
  Administered 2016-12-04 (×2): 1 mg via INTRAVENOUS

## 2016-12-04 MED ORDER — POLYVINYL ALCOHOL 1.4 % OP SOLN
1.0000 [drp] | Freq: Three times a day (TID) | OPHTHALMIC | Status: DC | PRN
  Filled 2016-12-04: qty 15

## 2016-12-04 MED ORDER — NALOXONE HCL 0.4 MG/ML IJ SOLN
INTRAMUSCULAR | Status: AC
  Administered 2016-12-04: 0.8 mg
  Filled 2016-12-04: qty 1

## 2016-12-04 MED ORDER — BISACODYL 10 MG RE SUPP: 10.0000 mg | Freq: Every day | RECTAL | Status: DC | PRN

## 2016-12-04 MED ORDER — BUPIVACAINE-EPINEPHRINE (PF) 0.25% -1:200000 IJ SOLN
INTRAMUSCULAR | Status: AC
  Filled 2016-12-04: qty 30

## 2016-12-04 MED ORDER — LOSARTAN POTASSIUM 50 MG PO TABS: 100.0000 mg | ORAL_TABLET | Freq: Every day | ORAL | Status: DC

## 2016-12-04 MED ORDER — LORATADINE 10 MG PO TABS
10.0000 mg | ORAL_TABLET | Freq: Every day | ORAL | Status: DC
  Administered 2016-12-06: 10 mg via ORAL
  Filled 2016-12-04 (×2): qty 1

## 2016-12-04 MED ORDER — MAGNESIUM CITRATE PO SOLN: 1.0000 | Freq: Once | ORAL | Status: DC | PRN

## 2016-12-04 MED ORDER — LACTATED RINGERS IV SOLN
INTRAVENOUS | Status: DC | PRN
  Administered 2016-12-04 (×2): via INTRAVENOUS

## 2016-12-04 MED ORDER — SODIUM CHLORIDE 0.9 % IV SOLN
INTRAVENOUS | Status: DC
  Administered 2016-12-04 – 2016-12-05 (×2): via INTRAVENOUS

## 2016-12-04 MED ORDER — CHLORHEXIDINE GLUCONATE 4 % EX LIQD: 60.0000 mL | Freq: Once | CUTANEOUS | Status: DC

## 2016-12-04 MED ORDER — HYDROMORPHONE HCL 1 MG/ML IJ SOLN
INTRAMUSCULAR | Status: AC
  Administered 2016-12-04: 0.5 mg via INTRAVENOUS
  Filled 2016-12-04: qty 1

## 2016-12-04 MED ORDER — PHENYLEPHRINE HCL 10 MG/ML IJ SOLN
INTRAVENOUS | Status: DC | PRN
  Administered 2016-12-04: 50 ug/min via INTRAVENOUS

## 2016-12-04 MED ORDER — DIPHENHYDRAMINE HCL 12.5 MG/5ML PO ELIX: 12.5000 mg | ORAL_SOLUTION | ORAL | Status: DC | PRN

## 2016-12-04 MED ORDER — METOPROLOL SUCCINATE ER 100 MG PO TB24: 100.0000 mg | ORAL_TABLET | Freq: Every day | ORAL | Status: DC

## 2016-12-04 MED ORDER — FUROSEMIDE 20 MG PO TABS: 20.0000 mg | ORAL_TABLET | Freq: Every day | ORAL | Status: DC

## 2016-12-04 MED ORDER — ALBUMIN HUMAN 5 % IV SOLN
INTRAVENOUS | Status: DC | PRN
  Administered 2016-12-04: 08:00:00 via INTRAVENOUS

## 2016-12-04 MED ORDER — METHOCARBAMOL 500 MG PO TABS
500.0000 mg | ORAL_TABLET | Freq: Four times a day (QID) | ORAL | Status: DC | PRN
  Administered 2016-12-04 (×2): 500 mg via ORAL
  Filled 2016-12-04: qty 1

## 2016-12-04 MED ORDER — 0.9 % SODIUM CHLORIDE (POUR BTL) OPTIME
TOPICAL | Status: DC | PRN
  Administered 2016-12-04: 1000 mL

## 2016-12-04 MED ORDER — TAMSULOSIN HCL 0.4 MG PO CAPS
0.4000 mg | ORAL_CAPSULE | Freq: Every day | ORAL | Status: DC
  Administered 2016-12-05 – 2016-12-06 (×2): 0.4 mg via ORAL
  Filled 2016-12-04 (×2): qty 1

## 2016-12-04 MED ORDER — HYDROMORPHONE HCL 1 MG/ML IJ SOLN
0.5000 mg | INTRAMUSCULAR | Status: DC | PRN
Start: 2016-12-04 — End: 2016-12-04
  Administered 2016-12-04: 0.5 mg via INTRAVENOUS
  Filled 2016-12-04: qty 1

## 2016-12-04 MED ORDER — PHENYLEPHRINE HCL 10 MG/ML IJ SOLN
INTRAMUSCULAR | Status: AC
  Filled 2016-12-04: qty 2

## 2016-12-04 MED ORDER — KETOROLAC TROMETHAMINE 15 MG/ML IJ SOLN
7.5000 mg | Freq: Four times a day (QID) | INTRAMUSCULAR | Status: AC
  Administered 2016-12-04 – 2016-12-05 (×4): 7.5 mg via INTRAVENOUS
  Filled 2016-12-04 (×3): qty 1

## 2016-12-04 MED ORDER — MENTHOL 3 MG MT LOZG: 1.0000 | LOZENGE | OROMUCOSAL | Status: DC | PRN

## 2016-12-04 MED ORDER — ONDANSETRON HCL 4 MG/2ML IJ SOLN
4.0000 mg | Freq: Four times a day (QID) | INTRAMUSCULAR | Status: DC | PRN
  Administered 2016-12-04: 4 mg via INTRAVENOUS
  Filled 2016-12-04: qty 2

## 2016-12-04 MED ORDER — FENTANYL CITRATE (PF) 250 MCG/5ML IJ SOLN
INTRAMUSCULAR | Status: DC | PRN
  Administered 2016-12-04 (×2): 50 ug via INTRAVENOUS

## 2016-12-04 MED ORDER — CEFAZOLIN SODIUM-DEXTROSE 2-4 GM/100ML-% IV SOLN
2.0000 g | Freq: Four times a day (QID) | INTRAVENOUS | Status: AC
  Administered 2016-12-04: 2 g via INTRAVENOUS
  Filled 2016-12-04 (×2): qty 100

## 2016-12-04 MED ORDER — ONDANSETRON HCL 4 MG/2ML IJ SOLN
INTRAMUSCULAR | Status: DC | PRN
  Administered 2016-12-04: 4 mg via INTRAVENOUS

## 2016-12-04 MED ORDER — PROPOFOL 500 MG/50ML IV EMUL
INTRAVENOUS | Status: DC | PRN
  Administered 2016-12-04: 10 ug/kg/min via INTRAVENOUS

## 2016-12-04 MED ORDER — HYDROMORPHONE HCL 1 MG/ML IJ SOLN
0.2500 mg | INTRAMUSCULAR | Status: DC | PRN
  Administered 2016-12-04 (×4): 0.5 mg via INTRAVENOUS

## 2016-12-04 MED ORDER — PROMETHAZINE HCL 25 MG/ML IJ SOLN: 6.2500 mg | INTRAMUSCULAR | Status: DC | PRN

## 2016-12-04 MED ORDER — SODIUM CHLORIDE 0.9 % IR SOLN
Status: DC | PRN
  Administered 2016-12-04: 3000 mL

## 2016-12-04 MED ORDER — HYDROCODONE-ACETAMINOPHEN 5-325 MG PO TABS
1.0000 | ORAL_TABLET | ORAL | Status: DC | PRN
  Administered 2016-12-05 – 2016-12-06 (×3): 1 via ORAL
  Filled 2016-12-04 (×3): qty 1

## 2016-12-04 MED ORDER — KETOROLAC TROMETHAMINE 15 MG/ML IJ SOLN
INTRAMUSCULAR | Status: AC
  Filled 2016-12-04: qty 1

## 2016-12-04 MED ORDER — ONDANSETRON HCL 4 MG PO TABS: 4.0000 mg | ORAL_TABLET | Freq: Four times a day (QID) | ORAL | Status: DC | PRN

## 2016-12-04 MED ORDER — METHOCARBAMOL 1000 MG/10ML IJ SOLN
500.0000 mg | Freq: Four times a day (QID) | INTRAVENOUS | Status: DC | PRN
  Filled 2016-12-04: qty 5

## 2016-12-04 MED ORDER — MIDAZOLAM HCL 2 MG/2ML IJ SOLN
INTRAMUSCULAR | Status: AC
  Filled 2016-12-04: qty 2

## 2016-12-04 MED ORDER — CEFAZOLIN SODIUM-DEXTROSE 2-3 GM-% IV SOLR: INTRAVENOUS | Status: DC | PRN

## 2016-12-04 SURGICAL SUPPLY — 60 items
BAG DECANTER FOR FLEXI CONT (MISCELLANEOUS) ×2 IMPLANT
BANDAGE ESMARK 6X9 LF (GAUZE/BANDAGES/DRESSINGS) ×1 IMPLANT
BLADE SAGITTAL 25.0X1.19X90 (BLADE) ×2 IMPLANT
BNDG CMPR 9X6 STRL LF SNTH (GAUZE/BANDAGES/DRESSINGS) ×1
BNDG ESMARK 6X9 LF (GAUZE/BANDAGES/DRESSINGS) ×2
BOWL SMART MIX CTS (DISPOSABLE) ×2 IMPLANT
CAP KNEE TOTAL 3 SIGMA ×1 IMPLANT
CEMENT HV SMART SET (Cement) ×4 IMPLANT
COVER SURGICAL LIGHT HANDLE (MISCELLANEOUS) ×2 IMPLANT
CUFF TOURNIQUET SINGLE 34IN LL (TOURNIQUET CUFF) ×1 IMPLANT
CUFF TOURNIQUET SINGLE 44IN (TOURNIQUET CUFF) IMPLANT
DECANTER SPIKE VIAL GLASS SM (MISCELLANEOUS) ×2 IMPLANT
DRAPE EXTREMITY T 121X128X90 (DRAPE) ×1 IMPLANT
DRAPE HALF SHEET 40X57 (DRAPES) ×4 IMPLANT
DRSG ADAPTIC 3X8 NADH LF (GAUZE/BANDAGES/DRESSINGS) ×2 IMPLANT
DRSG PAD ABDOMINAL 8X10 ST (GAUZE/BANDAGES/DRESSINGS) ×4 IMPLANT
DURAPREP 26ML APPLICATOR (WOUND CARE) ×4 IMPLANT
ELECT CAUTERY BLADE 6.4 (BLADE) ×2 IMPLANT
ELECT REM PT RETURN 9FT ADLT (ELECTROSURGICAL) ×2
ELECTRODE REM PT RTRN 9FT ADLT (ELECTROSURGICAL) ×1 IMPLANT
EVACUATOR 1/8 PVC DRAIN (DRAIN) IMPLANT
FACESHIELD WRAPAROUND (MASK) ×4 IMPLANT
FACESHIELD WRAPAROUND OR TEAM (MASK) ×2 IMPLANT
GAUZE SPONGE 4X4 12PLY STRL (GAUZE/BANDAGES/DRESSINGS) ×2 IMPLANT
GLOVE BIOGEL PI IND STRL 8 (GLOVE) ×1 IMPLANT
GLOVE BIOGEL PI IND STRL 8.5 (GLOVE) ×1 IMPLANT
GLOVE BIOGEL PI INDICATOR 8 (GLOVE) ×1
GLOVE BIOGEL PI INDICATOR 8.5 (GLOVE) ×1
GLOVE ECLIPSE 8.0 STRL XLNG CF (GLOVE) ×4 IMPLANT
GLOVE SURG ORTHO 8.5 STRL (GLOVE) ×4 IMPLANT
GOWN STRL REUS W/ TWL LRG LVL3 (GOWN DISPOSABLE) ×2 IMPLANT
GOWN STRL REUS W/TWL 2XL LVL3 (GOWN DISPOSABLE) ×2 IMPLANT
GOWN STRL REUS W/TWL LRG LVL3 (GOWN DISPOSABLE) ×4
HANDPIECE INTERPULSE COAX TIP (DISPOSABLE) ×2
KIT BASIN OR (CUSTOM PROCEDURE TRAY) ×2 IMPLANT
KIT ROOM TURNOVER OR (KITS) ×2 IMPLANT
MANIFOLD NEPTUNE II (INSTRUMENTS) ×2 IMPLANT
NEEDLE 22X1 1/2 (OR ONLY) (NEEDLE) ×2 IMPLANT
NS IRRIG 1000ML POUR BTL (IV SOLUTION) ×2 IMPLANT
PACK TOTAL JOINT (CUSTOM PROCEDURE TRAY) ×2 IMPLANT
PAD ABD 8X10 STRL (GAUZE/BANDAGES/DRESSINGS) ×1 IMPLANT
PAD ARMBOARD 7.5X6 YLW CONV (MISCELLANEOUS) ×4 IMPLANT
PAD CAST 4YDX4 CTTN HI CHSV (CAST SUPPLIES) ×1 IMPLANT
PADDING CAST COTTON 4X4 STRL (CAST SUPPLIES) ×2
PADDING CAST COTTON 6X4 STRL (CAST SUPPLIES) ×2 IMPLANT
SET HNDPC FAN SPRY TIP SCT (DISPOSABLE) ×1 IMPLANT
STAPLER VISISTAT 35W (STAPLE) ×2 IMPLANT
SUCTION FRAZIER HANDLE 10FR (MISCELLANEOUS) ×1
SUCTION TUBE FRAZIER 10FR DISP (MISCELLANEOUS) ×1 IMPLANT
SURGIFLO W/THROMBIN 8M KIT (HEMOSTASIS) IMPLANT
SUT BONE WAX W31G (SUTURE) ×2 IMPLANT
SUT ETHIBOND NAB CT1 #1 30IN (SUTURE) ×4 IMPLANT
SUT MNCRL AB 3-0 PS2 18 (SUTURE) ×2 IMPLANT
SUT VIC AB 0 CT1 27 (SUTURE) ×2
SUT VIC AB 0 CT1 27XBRD ANBCTR (SUTURE) ×1 IMPLANT
SYR CONTROL 10ML LL (SYRINGE) ×1 IMPLANT
TOWEL OR 17X24 6PK STRL BLUE (TOWEL DISPOSABLE) ×2 IMPLANT
TOWEL OR 17X26 10 PK STRL BLUE (TOWEL DISPOSABLE) ×2 IMPLANT
TRAY FOLEY BAG SILVER LF 16FR (SET/KITS/TRAYS/PACK) ×2 IMPLANT
WRAP KNEE MAXI GEL POST OP (GAUZE/BANDAGES/DRESSINGS) ×2 IMPLANT

## 2016-12-04 NOTE — Anesthesia Procedure Notes (Addendum)
Anesthesia Regional Block: Adductor canal block   Pre-Anesthetic Checklist: ,, timeout performed, Correct Patient, Correct Site, Correct Laterality, Correct Procedure, Correct Position, site marked, Risks and benefits discussed,  Surgical consent,  Pre-op evaluation,  At surgeon's request and post-op pain management  Laterality: Right and Lower  Prep: chloraprep       Needles:      Needle Length: 9cm  Needle Gauge: 21   Needle insertion depth: 5 cm   Additional Needles:   Procedures: ultrasound guided,,,,,,,,  Narrative:  Start time: 12/04/2016 7:00 AM End time: 12/04/2016 7:14 AM Injection made incrementally with aspirations every 5 mL.  Performed by: Personally  Anesthesiologist: Lakresha Stifter

## 2016-12-04 NOTE — Transfer of Care (Cosign Needed)
Immediate Anesthesia Transfer of Care Note  Patient: Stanley Sullivan  Procedure(s) Performed: Procedure(s): RIGHT TOTAL KNEE ARTHROPLASTY (Right)  Patient Location: PACU  Anesthesia Type:MAC, Spinal and MAC combined with regional for post-op pain  Level of Consciousness: awake, alert , oriented, drowsy and patient cooperative  Airway & Oxygen Therapy: Patient Spontanous Breathing and Patient connected to nasal cannula oxygen  Post-op Assessment: Report given to RN and Post -op Vital signs reviewed and stable  Post vital signs: Reviewed and stable  Last Vitals:  Vitals:   12/04/16 0612 12/04/16 0915  BP: (!) 190/88   Pulse:  64  Resp:    Temp:  (!) 36.3 C    Last Pain:  Vitals:   12/04/16 0608  TempSrc:   PainSc: 0-No pain      Patients Stated Pain Goal: 3 (53/61/44 3154)  Complications: No apparent anesthesia complications

## 2016-12-04 NOTE — Op Note (Signed)
PATIENT ID:      Stanley HugueninManilal N Sullivan  MRN:     [id number] DOB/AGE:    [birth year] / 76 y.o.       OPERATIVE REPORT    DATE OF PROCEDURE:  12/04/2016       PREOPERATIVE DIAGNOSIS:OSTEOARTHRITIS   RIGHT KNEE                                                       Estimated body mass index is 27.03 kg/m as calculated from the following:   Height as of 11/26/16: 5' 6.5" (1.689 m).   Weight as of 11/26/16: 170 lb (77.1 kg).     POSTOPERATIVE DIAGNOSIS:   RIGHT KNEE OSTEOARTHRITIS                                                                     Estimated body mass index is 27.03 kg/m as calculated from the following:   Height as of 11/26/16: 5' 6.5" (1.689 m).   Weight as of 11/26/16: 170 lb (77.1 kg).     PROCEDURE:  Procedure(s): RIGHT TOTAL KNEE ARTHROPLASTY      SURGEON:  Norlene CampbellPeter Jakub Debold, MD    ASSISTANT:   Jacqualine CodeBrian Petrarca, PA-C   (Present and scrubbed throughout the case, critical for assistance with exposure, retraction, instrumentation, and closure.)          ANESTHESIA: regional and spinal     DRAINS: HEMOVAC DRAIN IN RIGHT KNEE CLAMPED    TOURNIQUET TIME: 54 min   COMPLICATIONS:  None   CONDITION:  stable  PROCEDURE IN [id number]   Stanley Sullivan 12/04/2016, 8:53 AM

## 2016-12-04 NOTE — Anesthesia Procedure Notes (Signed)
Spinal  Patient location during procedure: OR Start time: 12/04/2016 7:16 AM End time: 12/04/2016 7:26 AM Staffing Anesthesiologist: Sharee HolsterMASSAGEE, Dot Splinter Performed: anesthesiologist  Preanesthetic Checklist Completed: patient identified, site marked, surgical consent, pre-op evaluation, timeout performed, IV checked and risks and benefits discussed Spinal Block Patient position: sitting Prep: ChloraPrep Patient monitoring: heart rate, cardiac monitor, continuous pulse ox and blood pressure Approach: right paramedian Location: L3-4 Injection technique: single-shot Needle Needle type: Quincke  Needle gauge: 22 G Needle insertion depth: 5 cm Assessment Sensory level: T6

## 2016-12-04 NOTE — Anesthesia Procedure Notes (Cosign Needed)
Procedure Name: MAC Date/Time: 12/04/2016 7:03 AM Performed by: Rica Koyanagi Pre-anesthesia Checklist: Patient identified, Emergency Drugs available, Suction available and Patient being monitored Patient Re-evaluated:Patient Re-evaluated prior to induction Oxygen Delivery Method: Nasal cannula and Simple face mask Placement Confirmation: positive ETCO2

## 2016-12-04 NOTE — Progress Notes (Signed)
Pt family called this nurse to pt room saying that pt is unresponsive, pt responsive to painful stimuli only and non verbal, pulse is weak and thready, pt given narcan 0.8mg  with good result. Pt responsive, A&Ox4.  Spoke with primary team.  See new orders.  AKingRNBSN

## 2016-12-04 NOTE — Anesthesia Preprocedure Evaluation (Addendum)
Anesthesia Evaluation  Patient identified by MRN, date of birth, ID band Patient awake    Reviewed: Allergy & Precautions, NPO status , Patient's Chart, lab work & pertinent test results  Airway Mallampati: II  TM Distance: <3 FB Neck ROM: Full    Dental no notable dental hx.    Pulmonary shortness of breath, with exertion and Long-Term Oxygen Therapy, asthma , sleep apnea , former smoker,    + rhonchi        Cardiovascular hypertension,  Rhythm:Regular Rate:Normal     Neuro/Psych    GI/Hepatic   Endo/Other  diabetes  Renal/GU Renal disease     Musculoskeletal  (+) Arthritis ,   Abdominal   Peds  Hematology   Anesthesia Other Findings   Reproductive/Obstetrics                            Anesthesia Physical Anesthesia Plan  ASA: III  Anesthesia Plan: Spinal   Post-op Pain Management:    Induction: Intravenous  PONV Risk Score and Plan: 2 and Ondansetron and Dexamethasone  Airway Management Planned: Natural Airway and Simple Face Mask  Additional Equipment:   Intra-op Plan:   Post-operative Plan:   Informed Consent: I have reviewed the patients History and Physical, chart, labs and discussed the procedure including the risks, benefits and alternatives for the proposed anesthesia with the patient or authorized representative who has indicated his/her understanding and acceptance.     Plan Discussed with: CRNA  Anesthesia Plan Comments:         Anesthesia Quick Evaluation

## 2016-12-04 NOTE — H&P (Signed)
The recent History & Physical has been reviewed. I have personally examined the patient today. There is no interval change to the documented History & Physical. The patient would like to proceed with the procedure.  Stanley Sullivan 12/04/2016,  6:59 AM

## 2016-12-04 NOTE — Anesthesia Postprocedure Evaluation (Signed)
Anesthesia Post Note  Patient: Stanley Sullivan  Procedure(s) Performed: Procedure(s) (LRB): RIGHT TOTAL KNEE ARTHROPLASTY (Right)     Patient location during evaluation: PACU Anesthesia Type: Spinal Level of consciousness: oriented and awake and alert Pain management: pain level controlled Vital Signs Assessment: post-procedure vital signs reviewed and stable Respiratory status: spontaneous breathing, respiratory function stable and patient connected to nasal cannula oxygen Cardiovascular status: blood pressure returned to baseline and stable Postop Assessment: no headache and no backache Anesthetic complications: no    Last Vitals:  Vitals:   12/04/16 1015 12/04/16 1030  BP:    Pulse: (!) 54 (!) 50  Resp: 12 14  Temp:      Last Pain:  Vitals:   12/04/16 1015  TempSrc:   PainSc: 0-No pain                 Ivie Savitt,JAMES TERRILL

## 2016-12-04 NOTE — Progress Notes (Signed)
Called Dr. Hoy RegisterWhitfield's office about calling rapid on pt. Waiting on a callback.

## 2016-12-04 NOTE — Op Note (Signed)
NAME:  Stanley Sullivan, Stanley Sullivan                    ACCOUNT NO.:  MEDICAL RECORD NO.:  [id number]  LOCATION:                                 FACILITY:  PHYSICIAN:  Vonna Kotyk. Durward Fortes, M.D.    DATE OF BIRTH:  DATE OF PROCEDURE:  12/04/2016 DATE OF DISCHARGE:                              OPERATIVE REPORT   PREOPERATIVE DIAGNOSIS:  Osteoarthritis, right knee.  POSTOPERATIVE DIAGNOSIS:  Osteoarthritis, right knee.  PROCEDURE:  Right total knee replacement.  SURGEON:  Vonna Kotyk. Durward Fortes, M.D.  ASSISTANT:  Aaron Edelman D. Petrarca, PA-C.  ANESTHESIA:  Spinal with supplemental adductor canal block.  COMPLICATIONS:  None.  COMPONENTS:  DePuy LCS large standard plus femoral component, a #3 rotating keeled tibial tray, a 10 mm polyethylene bridging bearing.  A 3- peg metal back rotating patella.  Components were secured with polymethylmethacrylate.  DESCRIPTION OF PROCEDURE:  Stanley Sullivan was met in the holding area with his family, identified the right knee as the appropriate operative site and marked it accordingly.  Any questions were answered.  Anesthesia performed an adductor canal block.  The patient was then transported to room #7 and placed under spinal anesthetic per anesthesia without difficulty.  He was then placed supine on the operating room table.  Nursing staff inserted a Foley catheter without problems.  Urine was clear.  Right lower extremity was then placed in a thigh tourniquet.  The right lower extremity was then prepped with chlorhexidine scrub and DuraPrep x2 from the tourniquet to the tips of the toes.  Sterile draping was performed.  Time-out was called.  The extremity was then elevated and Esmarch exsanguinated with a proximal tourniquet at 350 mmHg.  A midline longitudinal incision was made centered about the patella extending from the superior pouch to the tibial tubercle via sharp dissection, incision carried down to subcutaneous tissue.  First layer of capsule was  incised in the midline.  A medial parapatellar incision was made with the Bovie.  The joint was entered.  There was a clear yellow joint effusion approximately 15 mL in volume.  Patella was everted to 180 degrees laterally, the knee flexed to 90 degrees.  There was a moderate amount of beefy red synovitis in it. Synovectomy was performed.  There were moderate-sized osteophytes along the medial and lateral femoral condyles, which were removed.  There were also osteophytes along the medial tibial plateau that were removed. There was near complete absence of articular cartilage along the medial femoral condyle and just lesser extent the medial tibial plateau.  There was no fixed varus or valgus.  There was minimal loss of flexion.  Minimal loss of extension.  I measured a standard plus femoral component.  First, bony cut was then made transversely in the proximal tibia using the external tibial guide.  With each bony cut on both the tibia and the femur, I used the external guide to be sure we had appropriate alignment.  Subsequent cuts were then made on the femur with the standard plus femoral jig.  I used a 4-degree distal femoral valgus cut. Lamina spreaders were then inserted along the medial and lateral compartments.  I removed medial  and lateral menisci.  Also, ACL and PCL. Osteophytes were removed from the posterior femoral condyle with a 3/4- inch curved osteotome.  MCL and LCL remained intact.  Flexion and extension gaps were perfectly symmetrical at 10 mm.  Final cut was then made on the femur to obtain the center holes in the tapering cuts.  Retractors were then placed around the tibia.  It was advanced anteriorly.  I measured a #3 tibial tray.  This was pinned in place. Center hole was made followed by the keeled cut.  With the tibial jig still in place, the 10 mm trial polyethylene liner was inserted followed by the trial standard plus femoral component.  The entire  construct was reduced.  With full extension, no opening with a varus or valgus stress and no malrotation of the tibial tray.  Patella was then prepared by removing 10 mm of bone leaving a 13 mm patella thickness.  Patella jig was inserted.  Three holes were made. The trial patella was inserted, reduced and through a full range of motion remained stable.  Trial components were removed.  The joint was copiously irrigated with saline solution.  Final components were then impacted with polymethylmethacrylate. Initially applied the tibial tray, followed by the 10-mm polyethylene bridging bearing and the standard plus femoral component.  These were reduced, placed in extension under compression, and any extraneous methacrylate was removed from the periphery of the components.  Patella was applied with methacrylate and patellar clamp.  At approximately 16 minutes, the methacrylate had matured during which time, we injected the deep capsule with 0.25% Marcaine with epinephrine after irrigation.  Tourniquet was deflated at 54 minutes.  The joint was again irrigated, any gross bleeders were Bovie coagulated.  We then applied tranexamic acid topically and the compression for 5 minutes.  A medium-size Hemovac was inserted.  Deep capsule was closed with a running #1 Ethibond, superficial capsule with a running 0 Vicryl, subcu with 3-0 Monocryl, skin closed with skin clips.  A sterile bulky dressing was applied followed by the patient's support stocking.  The patient tolerated procedure without complications.     Vonna Kotyk. Durward Fortes, M.D.     PWW/MEDQ  D:  12/04/2016  T:  12/04/2016  Job:  [id number]

## 2016-12-05 ENCOUNTER — Inpatient Hospital Stay (HOSPITAL_COMMUNITY): Payer: Medicare Other

## 2016-12-05 ENCOUNTER — Encounter (HOSPITAL_COMMUNITY): Payer: Self-pay | Admitting: Orthopaedic Surgery

## 2016-12-05 DIAGNOSIS — N183 Chronic kidney disease, stage 3 unspecified: Secondary | ICD-10-CM | POA: Diagnosis present

## 2016-12-05 DIAGNOSIS — G4730 Sleep apnea, unspecified: Secondary | ICD-10-CM | POA: Diagnosis present

## 2016-12-05 DIAGNOSIS — I10 Essential (primary) hypertension: Secondary | ICD-10-CM | POA: Diagnosis present

## 2016-12-05 DIAGNOSIS — E875 Hyperkalemia: Secondary | ICD-10-CM | POA: Diagnosis present

## 2016-12-05 DIAGNOSIS — I5032 Chronic diastolic (congestive) heart failure: Secondary | ICD-10-CM | POA: Diagnosis present

## 2016-12-05 DIAGNOSIS — J9611 Chronic respiratory failure with hypoxia: Secondary | ICD-10-CM | POA: Diagnosis present

## 2016-12-05 DIAGNOSIS — R05 Cough: Secondary | ICD-10-CM

## 2016-12-05 DIAGNOSIS — N179 Acute kidney failure, unspecified: Secondary | ICD-10-CM | POA: Diagnosis present

## 2016-12-05 HISTORY — DX: Chronic respiratory failure with hypoxia: J96.11

## 2016-12-05 LAB — BASIC METABOLIC PANEL
ANION GAP: 7 (ref 5–15)
ANION GAP: 5 (ref 5–15)
BUN: 23 mg/dL — ABNORMAL HIGH (ref 6–20)
BUN: 20 mg/dL (ref 6–20)
CALCIUM: 7.9 mg/dL — AB (ref 8.9–10.3)
CHLORIDE: 110 mmol/L (ref 101–111)
CHLORIDE: 115 mmol/L — AB (ref 101–111)
CO2: 19 mmol/L — AB (ref 22–32)
CO2: 17 mmol/L — AB (ref 22–32)
Calcium: 6.3 mg/dL — CL (ref 8.9–10.3)
Creatinine, Ser: 2.14 mg/dL — ABNORMAL HIGH (ref 0.61–1.24)
Creatinine, Ser: 1.81 mg/dL — ABNORMAL HIGH (ref 0.61–1.24)
GFR calc non Af Amer: 28 mL/min — ABNORMAL LOW (ref >60)
GFR calc non Af Amer: 34 mL/min — ABNORMAL LOW (ref >60)
GFR, EST AFRICAN AMERICAN: 33 mL/min — AB (ref >60)
GFR, EST AFRICAN AMERICAN: 40 mL/min — AB (ref >60)
GLUCOSE: 104 mg/dL — AB (ref 65–99)
GLUCOSE: 119 mg/dL — AB (ref 65–99)
POTASSIUM: 6.3 mmol/L — AB (ref 3.5–5.1)
POTASSIUM: 4 mmol/L (ref 3.5–5.1)
Sodium: 136 mmol/L (ref 135–145)
Sodium: 137 mmol/L (ref 135–145)

## 2016-12-05 LAB — CBC
HEMATOCRIT: 42 % (ref 39.0–52.0)
HEMOGLOBIN: 13.3 g/dL (ref 13.0–17.0)
MCH: 23.9 pg — ABNORMAL LOW (ref 26.0–34.0)
MCHC: 31.7 g/dL (ref 30.0–36.0)
MCV: 75.4 fL — AB (ref 78.0–100.0)
Platelets: 137 10*3/uL — ABNORMAL LOW (ref 150–400)
RBC: 5.57 MIL/uL (ref 4.22–5.81)
RDW: 17 % — AB (ref 11.5–15.5)
WBC: 13 10*3/uL — AB (ref 4.0–10.5)

## 2016-12-05 MED ORDER — SODIUM CHLORIDE 0.9 % IV SOLN: INTRAVENOUS | Status: DC

## 2016-12-05 MED FILL — Medication: Qty: 1 | Status: AC

## 2016-12-05 NOTE — Evaluation (Signed)
Occupational Therapy Evaluation Patient Details Name: Stanley Sullivan MRN: [id number] DOB: July 28, 1940 Today's Date: 12/05/2016    History of Present Illness Stanley Sullivan ERGLE is a 76 y.o. 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 Marchelle Gearing, hypertension, grade 2 diastolic dysfunction, stage III chronic kidney, dyslipidemia, chronic hypoxemic respiratory failure on 2-3 L oxygen. Patient was admitted for R TKA, postop developed decreased responsiveness due to medications, also with low BP's.     Clinical Impression   PTA, pt was living with his wife and was independent. Currently, pt requires Min A for LB ADLs and Min Guard A for functional mobility using RW. Provided education on toilet transfer and shower transfer with 3N1. Pt demonstrated understanding of PWB precautions throughout session. Pt would benefit form further acute OT to facilitate safe dc and address LB ADLs. Recommend dc SNF for further OT to increase safety and independence with ADL and functional mobility.    Follow Up Recommendations  SNF;Supervision/Assistance - 24 hour    Equipment Recommendations  Other (comment) (Defer to next venue)    Recommendations for Other Services       Precautions / Restrictions Precautions Precautions: Fall Precaution Comments: O2 dependent Restrictions Weight Bearing Restrictions: Yes RLE Weight Bearing: Weight bearing as tolerated RLE Partial Weight Bearing Percentage or Pounds: 50%      Mobility Bed Mobility               General bed mobility comments: up in chair  Transfers Overall transfer level: Needs assistance Equipment used: Rolling walker (2 wheeled) Transfers: Sit to/from Stand Sit to Stand: Min assist         General transfer comment: assist for balance, cues for technique    Balance Overall balance assessment: Needs assistance Sitting-balance support: No upper extremity supported;Feet supported Sitting balance-Leahy  Scale: Good Sitting balance - Comments: able to lean forward to adjust socks without LOB   Standing balance support: Bilateral upper extremity supported;During functional activity Standing balance-Leahy Scale: Poor Standing balance comment: UE support for balance                           ADL either performed or assessed with clinical judgement   ADL Overall ADL's : Needs assistance/impaired Eating/Feeding: Set up;Sitting   Grooming: Set up;Sitting   Upper Body Bathing: Set up;Sitting   Lower Body Bathing: Minimal assistance;Sit to/from stand   Upper Body Dressing : Set up;Sitting   Lower Body Dressing: Minimal assistance;Sit to/from stand Lower Body Dressing Details (indicate cue type and reason): Pt demonstrated good ROM to reach down and adjust socks Toilet Transfer: Min guard;Ambulation;RW;BSC       Tub/ Shower Transfer: Walk-in shower;Min guard;3 in 1;Ambulation;Rolling walker;Cueing for Print production planner Details (indicate cue type and reason): Provided education on shower transfer with 3N1. Pt demonstrated good understanding and granddaughter verbalized understanding to increased carry over to home Functional mobility during ADLs: Min guard;Rolling walker General ADL Comments: Pt demonstrated good functional performance post surgery. Provided education on shower and toilet t/f. Pt would benefit from further OT to address LB ADLs.     Vision         Perception     Praxis      Pertinent Vitals/Pain Pain Assessment: 0-10 Faces Pain Scale: Hurts little more Pain Location: R knee Pain Descriptors / Indicators: Sore Pain Intervention(s): Monitored during session;Repositioned     Hand Dominance Right   Extremity/Trunk Assessment Upper  Extremity Assessment Upper Extremity Assessment: Overall WFL for tasks assessed   Lower Extremity Assessment Lower Extremity Assessment: Defer to PT evaluation   Cervical / Trunk Assessment Cervical /  Trunk Assessment: Normal   Communication Communication Communication: Prefers language other than [place];Interpreter utilized   Cognition Arousal/Alertness: Awake/alert Behavior During Therapy: WFL for tasks assessed/performed Overall Cognitive Status: Within Functional Limits for tasks assessed                                     General Comments  Granddaughter present for session. Used in person interpreter    Exercises    Shoulder Instructions      Home Living Family/patient expects to be discharged to:: Private residence Living Arrangements: Spouse/significant other Available Help at Discharge: Family;Available 24 hours/day Type of Home: House Home Access: Stairs to enter Entergy CorporationEntrance Stairs-Number of Steps: 3 Entrance Stairs-Rails: Can reach both Home Layout: Two level;Able to live on main level with bedroom/bathroom Alternate Level Stairs-Number of Steps: 20 Alternate Level Stairs-Rails: Right Bathroom Shower/Tub: Producer, television/film/videoWalk-in shower   Bathroom Toilet: Standard     Home Equipment: Cane - single point   Additional Comments: Granddaughter states family will be putting in a chair lift for stairs to second story bedroom, or will put bedroom on first floor.      Prior Functioning/Environment Level of Independence: Independent        Comments: Pt drives. Pt active and walks regularly, enjoys woodworking and hands on work        OT Problem List: Decreased activity tolerance;Impaired balance (sitting and/or standing);Decreased safety awareness;Decreased knowledge of use of DME or AE;Decreased knowledge of precautions;Pain      OT Treatment/Interventions: Self-care/ADL training;Therapeutic exercise;Energy conservation;DME and/or AE instruction;Therapeutic activities;Patient/family education    OT Goals(Current goals can be found in the care plan section) Acute Rehab OT Goals Patient Stated Goal: To go home OT Goal Formulation: With patient Time For Goal  Achievement: 12/19/16 Potential to Achieve Goals: Good  OT Frequency: Min 2X/week   Barriers to D/C:            Co-evaluation              AM-PAC PT "6 Clicks" Daily Activity     Outcome Measure Help from another person eating meals?: None Help from another person taking care of personal grooming?: A Little Help from another person toileting, which includes using toliet, bedpan, or urinal?: A Little Help from another person bathing (including washing, rinsing, drying)?: A Little Help from another person to put on and taking off regular upper body clothing?: None Help from another person to put on and taking off regular lower body clothing?: A Little 6 Click Score: 20   End of Session Equipment Utilized During Treatment: Gait belt;Rolling walker;Oxygen (4L) Nurse Communication: Mobility status  Activity Tolerance: Patient tolerated treatment well Patient left: in chair;with call bell/phone within reach;with family/visitor present  OT Visit Diagnosis: Other abnormalities of gait and mobility (R26.89);Pain Pain - Right/Left: Right Pain - part of body: Knee                Time: [id number] OT Time Calculation (min): 21 min Charges:  OT General Charges $OT Visit: 1 Procedure OT Evaluation $OT Eval Low Complexity: 1 Procedure G-Codes:     Mckinsey Keagle MSOT, OTR/L Acute Rehab Pager: [phone] Office: [phone]  Theodoro GristCharis M Lacee Grey 12/05/2016, 1:47 PM

## 2016-12-05 NOTE — Progress Notes (Signed)
Physical Therapy Treatment Patient Details Name: Stanley Sullivan MRN: [id number] DOB: 10/26/1940 Today's Date: 12/05/2016    History of Present Illness Stanley Sullivan is a 76 y.o. 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 Marchelle Gearing, hypertension, grade 2 diastolic dysfunction, stage III chronic kidney, dyslipidemia, chronic hypoxemic respiratory failure on 2-3 L oxygen. Patient was admitted for R TKA, postop developed decreased responsiveness due to medications, also with low BP's.     PT Comments    Pt progressing with mobility. Continues to require min guard for safety with amb and multiple cues for gait mechanics and R knee extension for heel-to-toe walking; dyspnea 2/4 with exertion. SpO2 down to 70s after ambulation on 4L O2, returning to 93% with cues for pursed lip breathing. Would benefit from continued acute PT services. Will continue to follow.   Follow Up Recommendations  DC plan and follow up therapy as arranged by surgeon;SNF;Supervision/Assistance - 24 hour     Equipment Recommendations  Rolling walker with 5" wheels    Recommendations for Other Services       Precautions / Restrictions Precautions Precautions: Fall Precaution Comments: O2 dependent Restrictions Weight Bearing Restrictions: Yes RLE Weight Bearing: Weight bearing as tolerated RLE Partial Weight Bearing Percentage or Pounds: 50%    Mobility  Bed Mobility Overal bed mobility: Needs Assistance Bed Mobility: Sit to Supine       Sit to supine: Min guard   General bed mobility comments: Received up in chair. Able to lay down and scoot up in bed with min guard and cues for technique.  Transfers Overall transfer level: Needs assistance Equipment used: Rolling walker (2 wheeled) Transfers: Sit to/from Stand Sit to Stand: Min assist         General transfer comment: MinA for balance and cues for hand placement on RW  Ambulation/Gait Ambulation/Gait assistance:  Min guard Ambulation Distance (Feet): 140 Feet Assistive device: Rolling walker (2 wheeled) Gait Pattern/deviations: Step-to pattern;Step-through pattern;Decreased stride length Gait velocity: Decreased Gait velocity interpretation: <1.8 ft/sec, indicative of risk for recurrent falls General Gait Details: Cues for step-through pattern and RLE knee extension for heel-to-toe step pattern. Required 1x standing rest break for dyspnea 2/4 with exertion.   Stairs            Wheelchair Mobility    Modified Rankin (Stroke Patients Only)       Balance Overall balance assessment: Needs assistance Sitting-balance support: No upper extremity supported;Feet supported Sitting balance-Leahy Scale: Good     Standing balance support: Bilateral upper extremity supported;During functional activity Standing balance-Leahy Scale: Poor Standing balance comment: Reliant on UE support for balance                            Cognition Arousal/Alertness: Awake/alert Behavior During Therapy: WFL for tasks assessed/performed Overall Cognitive Status: Within Functional Limits for tasks assessed                                        Exercises    General Comments General comments (skin integrity, edema, etc.): O2 saturation down to 70s after ambulation on 4L O2 [place], cues for pursed lip breathing and return to 93% in ~2 minutes.      Pertinent Vitals/Pain Pain Assessment: Faces Faces Pain Scale: Hurts little more Pain Location: R knee Pain Descriptors / Indicators: Aching;Sore  Pain Intervention(s): Limited activity within patient's tolerance;Monitored during session    Home Living                      Prior Function            PT Goals (current goals can now be found in the care plan section) Acute Rehab PT Goals Patient Stated Goal: To go home PT Goal Formulation: With patient/family Time For Goal Achievement: 12/12/16 Potential to Achieve Goals:  Good Progress towards PT goals: Progressing toward goals    Frequency    7X/week      PT Plan Current plan remains appropriate    Co-evaluation              AM-PAC PT "6 Clicks" Daily Activity  Outcome Measure  Difficulty turning over in bed (including adjusting bedclothes, sheets and blankets)?: A Little Difficulty moving from lying on back to sitting on the side of the bed? : Total Difficulty sitting down on and standing up from a chair with arms (e.g., wheelchair, bedside commode, etc,.)?: Total Help needed moving to and from a bed to chair (including a wheelchair)?: A Little Help needed walking in hospital room?: A Little Help needed climbing 3-5 steps with a railing? : A Little 6 Click Score: 14    End of Session Equipment Utilized During Treatment: Gait belt;Oxygen Activity Tolerance: Patient limited by fatigue Patient left: in bed;in CPM;with family/visitor present;with call bell/phone within reach   PT Visit Diagnosis: Other abnormalities of gait and mobility (R26.89);Difficulty in walking, not elsewhere classified (R26.2);Pain Pain - Right/Left: Right Pain - part of body: Knee     Time: [id number] PT Time Calculation (min) (ACUTE ONLY): 24 min  Charges:  $Gait Training: 8-22 mins $Therapeutic Activity: 8-22 mins                    G CodesSheran Lawless:       Cyndi Sharron [place], [place] CarolinaPT [phone] 12/05/2016    Elray Mcgregorynthia Dejon Lukas 12/05/2016, 5:18 PM

## 2016-12-05 NOTE — Plan of Care (Signed)
Problem: Safety: Goal: Ability to remain free from injury will improve Patient's belongings and call light within reach. Family at bedside. Patient will call for assistance when needed.

## 2016-12-05 NOTE — Evaluation (Signed)
Physical Therapy Evaluation Patient Details Name: Stanley Sullivan MRN: [id number] DOB: 02-15-1941 Today's Date: 12/05/2016   History of Present Illness  Stanley Sullivan is a 76 y.o. 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 Patient was admitted for R TKA, postop developed decreased responsiveness due to medications, also with low BP's.    Clinical Impression  Patient presents with decreased independence with mobility due to weakness, pain, decreased ROM and limited cardiopulmonary endurance.  He will benefit from skilled PT in the acute setting prior to d/c to SNF level rehab.  Of note, but on 4L O2 and dropped to 83% during ambulation and returned to 88% after resting <2 min.      Follow Up Recommendations DC plan and follow up therapy as arranged by surgeon;SNF;Supervision/Assistance - 24 hour    Equipment Recommendations  Rolling walker with 5" wheels    Recommendations for Other Services       Precautions / Restrictions Precautions Precautions: Fall Precaution Comments: O2 dependent Restrictions Weight Bearing Restrictions: Yes RLE Weight Bearing: Weight bearing as tolerated RLE Partial Weight Bearing Percentage or Pounds: 50%      Mobility  Bed Mobility               General bed mobility comments: up in chair  Transfers Overall transfer level: Needs assistance Equipment used: Rolling walker (2 wheeled) Transfers: Sit to/from Stand Sit to Stand: Min assist         General transfer comment: assist for balance, cues for technique  Ambulation/Gait Ambulation/Gait assistance: Min guard Ambulation Distance (Feet): 140 Feet Assistive device: Rolling walker (2 wheeled) Gait Pattern/deviations: Step-to pattern;Step-through pattern;Decreased stride length     General Gait Details:  cues for technique and weight bearing throughout, also for standing rest and pursed lip breathing about 3 minutes prior to return to room  Stairs            Wheelchair Mobility    Modified Rankin (Stroke Patients Only)       Balance Overall balance assessment: Needs assistance Sitting-balance support: No upper extremity supported;Feet supported Sitting balance-Leahy Scale: Good Sitting balance - Comments: able to lean forward to adjust socks without LOB   Standing balance support: Bilateral upper extremity supported;During functional activity Standing balance-Leahy Scale: Poor Standing balance comment: UE support for balance                             Pertinent Vitals/Pain Pain Assessment: 0-10 Faces Pain Scale: Hurts little more Pain Location: R knee Pain Descriptors / Indicators: Sore Pain Intervention(s): Monitored during session;Repositioned    Home Living Family/patient expects to be discharged to:: Private residence Living Arrangements: Spouse/significant other Available Help at Discharge: Family;Available 24 hours/day Type of Home: House Home Access: Stairs to enter Entrance Stairs-Rails: Can reach both Entrance Stairs-Number of Steps: 3 Home Layout: Two level;Able to live on main level with bedroom/bathroom Home Equipment: Gilmer MorCane - single point Additional Comments: Granddaughter states family will be putting in a chair lift for stairs to second story bedroom, or will put bedroom on first floor.    Prior Function Level of Independence: Independent         Comments: Pt drives. Pt active and walks regularly, enjoys woodworking and hands on work     Hand Dominance   Dominant Hand:  Right    Extremity/Trunk Assessment   Upper Extremity Assessment Upper Extremity Assessment: Overall WFL for tasks assessed    Lower Extremity Assessment Lower Extremity Assessment: Defer to PT evaluation    Cervical / Trunk Assessment Cervical / Trunk  Assessment: Normal  Communication   Communication: Prefers language other than [place];Interpreter utilized  Cognition Arousal/Alertness: Awake/alert Behavior During Therapy: WFL for tasks assessed/performed Overall Cognitive Status: Within Functional Limits for tasks assessed                                        General Comments General comments (skin integrity, edema, etc.): interpreter in room, but grandaughter doing most of the interpreting.  SpO2 83% after ambulating on 4L O2, cues for pursed lip breathing and return to 88% in <2 minutes    Exercises Total Joint Exercises Ankle Circles/Pumps: AROM;Both;Seated;10 reps Quad Sets: Right;Seated;10 reps;Strengthening Short Arc Quad: Strengthening;10 reps;Seated;Right Heel Slides: AAROM;Right;10 reps;Seated Hip ABduction/ADduction: AROM;Right;10 reps;Seated Straight Leg Raises: AROM;Right;10 reps;Seated   Assessment/Plan    PT Assessment Patient needs continued PT services  PT Problem List Decreased range of motion;Decreased strength;Decreased balance;Decreased activity tolerance;Pain;Decreased mobility;Decreased knowledge of precautions;Decreased knowledge of use of DME       PT Treatment Interventions DME instruction;Gait training;Balance training;Functional mobility training;Stair training;Therapeutic exercise;Patient/family education;Therapeutic activities    PT Goals (Current goals can be found in the Care Plan section)  Acute Rehab PT Goals Patient Stated Goal: To go home PT Goal Formulation: With patient/family Time For Goal Achievement: 12/12/16 Potential to Achieve Goals: Good    Frequency 7X/week   Barriers to discharge        Co-evaluation               AM-PAC PT "6 Clicks" Daily Activity  Outcome Measure Difficulty turning over in bed (including adjusting bedclothes, sheets and blankets)?: A Little Difficulty moving from lying on back to sitting on the side of the bed? :  Total Difficulty sitting down on and standing up from a chair with arms (e.g., wheelchair, bedside commode, etc,.)?: Total Help needed moving to and from a bed to chair (including a wheelchair)?: A Little Help needed walking in hospital room?: A Little Help needed climbing 3-5 steps with a railing? : A Little 6 Click Score: 14    End of Session Equipment Utilized During Treatment: Gait belt Activity Tolerance: Patient limited by fatigue Patient left: in chair;with call bell/phone within reach;with family/visitor present   PT Visit Diagnosis: Other abnormalities of gait and mobility (R26.89);Difficulty in walking, not elsewhere classified (R26.2);Pain Pain - Right/Left: Right Pain - part of body: Knee    Time: 1610-9604 PT Time Calculation (min) (ACUTE ONLY): 26 min   Charges:   PT Evaluation $PT Eval Moderate Complexity: 1 Mod PT Treatments $Therapeutic Exercise: 8-22 mins   PT G CodesSheran Lawless, [place] [phone] 12/05/2016   Elray Mcgregor 12/05/2016, 1:50 PM

## 2016-12-05 NOTE — Progress Notes (Signed)
RR call came over pager as [facility] un[facility]. Pt arrived to 5n23  from PACU approx 1230. Pt resting, no complaints, a/o x4, responsive to verbal stimuli. 1935 Dilaudid 0.5 mg IVP given, pt immediatly became unresponsive, unable to arouse with sternal rub. PTA Narcan 0.8 mg IVP given, pt was initially alert and oriented to self, disoriented to time and place, shortly pt was alert and oriented x4. Pt nauseous, Zofran 4 mg IVP given.  BP 160/75, HR 69, RR 18, 100% 2L. No interventions from RRT at this time. RN encouraged to call for any further assistance.  Dr. Hoy RegisterWhitfield's on call physician made aware.

## 2016-12-05 NOTE — Progress Notes (Signed)
Subjective: 1 Day Post-Op Procedure(s) (LRB): RIGHT TOTAL KNEE ARTHROPLASTY (Right) Patient reports pain as mild and moderate.    Objective: Vital signs in last 24 hours: Temp:  [97.4 F (36.3 C)-98.3 F (36.8 C)] 97.8 F (36.6 C) (08/08 1359) Pulse Rate:  [62-68] 67 (08/08 1359) Resp:  [15-16] 16 (08/08 1359) BP: (94-149)/(47-57) 149/47 (08/08 1359) SpO2:  [92 %-96 %] 92 % (08/08 1359)  Intake/Output from previous day: 08/07 0701 - 08/08 0700 In: 1450 [I.V.:1200; IV Piggyback:250] Out: 950 [Urine:900; Blood:50] Intake/Output this shift: No intake/output data recorded.   Recent Labs  12/04/16 1249 12/05/16 0436  HGB 13.4 13.3    Recent Labs  12/04/16 1249 12/05/16 0436  WBC 10.1 13.0*  RBC 5.86* 5.57  HCT 43.0 42.0  PLT 148* 137*    Recent Labs  12/05/16 0436 12/05/16 0721  NA 136 137  K 6.3* 4.0  CL 110 115*  CO2 19* 17*  BUN 23* 20  CREATININE 2.14* 1.81*  GLUCOSE 104* 119*  CALCIUM 7.9* 6.3*   No results for input(s): LABPT, INR in the last 72 hours.  Sensation intact distally Intact pulses distally Dorsiflexion/Plantar flexion intact Incision: dressing C/D/I and no drainage Compartment soft  Assessment/Plan: 1 Day Post-Op Procedure(s) (LRB): RIGHT TOTAL KNEE ARTHROPLASTY (Right) Advance diet Up with therapy Continue foley due to strict I&O  Social service working on placement Hospitalists is caring for his medical needs at this time  Jacqualine CodeBrian Raya Mckinstry 12/05/2016, 5:52 PM

## 2016-12-05 NOTE — Consult Note (Signed)
Consultation Note   Stanley Sullivan ZOX:[id number] DOB: [birth year] DOA: 12/04/2016   PCP: Wilson Singer, MD   Consulting physician: Konrad Dolores  Requesting physician: Cleophas Dunker  Reason for consultation: Acute kidney injury with hyperkalemia  HPI: Stanley Sullivan is a 76 y.o. 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 Marchelle Gearing, hypertension, grade 2 diastolic dysfunction, stage III chronic kidney, dyslipidemia, chronic hypoxemic respiratory failure on 2-3 L oxygen. Stanley Sullivan was admitted for elective knee replacement surgery. Was noted to have suboptimal blood pressures in the first 12-16 hours postoperatively. He also became unresponsive secondary to pain medications with improvement in symptoms after Narcan. A.m. labs revealed worsening of renal function with mild hypokalemia at 6.3 with repeat electrical panel pending. Orthopedic service has requested medicine assistance.   Review of Systems:  In addition to the HPI above,  No Fever-chills, myalgias or other constitutional symptoms No Headache, changes with Vision or hearing, new weakness, tingling, numbness in any extremity, dizziness, dysarthria or word finding difficulty, gait disturbance or imbalance, tremors or seizure activity No problems swallowing food or Liquids, indigestion/reflux, choking or coughing while eating, abdominal pain with or after eating No Chest pain, no change in chronic Shortness of Breath, palpitations, orthopnea or DOE; has developed dry nonproductive cough since my initial evaluation of the Stanley Sullivan earlier this morning No Abdominal pain, N/V, melena,hematochezia, dark tarry stools, constipation No dysuria, malodorous urine, hematuria or flank pain No new skin rashes, lesions, masses or bruises, No new joint pains, aches, swelling or redness No recent unintentional weight gain or loss No polyuria, polydypsia or polyphagia   Past Medical History:  Diagnosis Date  . Allergy,  unspecified not elsewhere classified   . Chronic kidney disease, stage III (moderate)    Stanley Sullivan denies  . External hemorrhoids without mention of complication   . Gout, unspecified   . Hypertension   . Hypertrophy of prostate without urinary obstruction and other lower urinary tract symptoms (LUTS)   . Obesity, unspecified   . On home oxygen therapy    in the morning and evening, with exertion, as needed when O2 drops to 80s  . Organic sleep apnea, unspecified    CPAP, presure setting 3  . Osteoarthrosis, unspecified whether generalized or localized, unspecified site   . Other abnormal glucose   . Other and unspecified hyperlipidemia   . Other atopic dermatitis and related conditions   . Other specified visual disturbances   . Pain in joint, lower leg   . Pain in limb   . Unspecified disorder of kidney and ureter   . Unspecified essential hypertension   . Urticaria, unspecified     Past Surgical History:  Procedure Laterality Date  . CATARACT EXTRACTION      Social History   Social History  . Marital status: Married    Spouse name: N/A  . Number of children: N/A  . Years of education: N/A   Occupational History  . retired    Social History Main Topics  . Smoking status: Former Smoker    Packs/day: 0.50    Years: 15.00    Types: Cigarettes    Quit date: 04/30/1993  . Smokeless tobacco: Former Neurosurgeon    Types: Chew  . Alcohol use Yes     Comment: occassional  . Drug use: No  . Sexual activity: Not on file   Other Topics Concern  . Not on file   Social History Narrative  . No narrative on file  Allergies  Allergen Reactions  . No Known Allergies     Family history reviewed and not pertinent to current admission findings were consultation request  Prior to Admission medications   Medication Sig Start Date End Date Taking? Authorizing Provider  albuterol (PROVENTIL HFA;VENTOLIN HFA) 108 (90 Base) MCG/ACT inhaler Inhale 2 puffs into the lungs every 6  (six) hours as needed for wheezing or shortness of breath (or cough). Stanley Sullivan taking differently: Inhale 2 puffs into the lungs every 6 (six) hours as needed for wheezing or shortness of breath (or cough). For shortness of breath/wheezing. 05/02/16  Yes Kirt Boys, DO  allopurinol (ZYLOPRIM) 100 MG tablet Take 1 tablet (100 mg total) by mouth daily. For gout 10/13/15  Yes Reed, Tiffany L, DO  atorvastatin (LIPITOR) 80 MG tablet Take 80 mg by mouth daily.  10/15/16  Yes [provider]  CALCIUM PO Take 1 tablet by mouth daily.   Yes [provider]  cetirizine (KLS ALLER-TEC) 10 MG tablet Take 10 mg by mouth daily as needed (for allergies.).    Yes [provider]  cholecalciferol (VITAMIN D) 1000 units tablet Take 1,000 Units by mouth daily.   Yes [provider]  Cyanocobalamin (VITAMIN B-12) 2500 MCG SUBL Place 2,500 mcg under the tongue daily.   Yes [provider]  furosemide (LASIX) 20 MG tablet Take 1 tablet (20 mg total) by mouth daily. For swelling of legs 07/18/15  Yes Reed, Tiffany L, DO  losartan (COZAAR) 100 MG tablet Take 100 mg by mouth daily.  10/15/16  Yes [provider]  metoprolol succinate (TOPROL-XL) 100 MG 24 hr tablet TAKE 1 TABLET (100 MG TOTAL) BY MOUTH DAILY. 06/25/16  Yes Reed, Tiffany L, DO  tamsulosin (FLOMAX) 0.4 MG CAPS capsule TAKE 1 CAPSULE BY MOUTH ONCE A DAY FOR YOUR PROSTATE 04/16/16  Yes Reed, Tiffany L, DO  acetaminophen (TYLENOL) 500 MG tablet Take 500-1,000 mg by mouth every 6 (six) hours as needed (for headache/pain.). Take 1-2 tablets by mouth every 6-8 hours as needed for pain.    [provider]  polyvinyl alcohol (LUBRICANT DROPS) 1.4 % ophthalmic solution Place 1 drop into both eyes 3 (three) times daily as needed for dry eyes.    [provider]    Physical Exam: Vitals:   12/04/16 1606 12/04/16 2106 12/05/16 0153 12/05/16 0632  BP: 110/66 (!) 94/51 (!) 112/51 (!) 141/57  Pulse: (!)  54 64 62 68  Resp: 12 15 15 15   Temp: (!) 97.5 F (36.4 C) 98.3 F (36.8 C) 97.8 F (36.6 C) (!) 97.4 F (36.3 C)  TempSrc: Oral Axillary Oral Oral  SpO2: 92% 93% 96% 92%      Constitutional: NAD, calm, comfortable Eyes: PERRL, lids and conjunctivae normal ENMT: Mucous membranes are moist. Posterior pharynx clear of any exudate or lesions.Normal dentition.  Neck: normal, supple, no masses, no thyromegaly Respiratory: clear to auscultation bilaterally, no wheezing, no crackles. Normal respiratory effort. No accessory muscle use.  Cardiovascular: Regular rate and rhythm, no murmurs / rubs / gallops. No extremity edema. 2+ pedal pulses. No carotid bruits.  Abdomen: no tenderness, no masses palpated. No hepatosplenomegaly. Bowel sounds positive.  Musculoskeletal: no clubbing / cyanosis. No joint deformity upper and lower extremities. Good ROM, no contractures. Normal muscle tone. RLE in CPM Skin: no rashes, lesions, ulcers. No induration Neurologic: CN 2-12 grossly intact. Sensation intact, DTR normal. Strength 5/5 x all 4 extremities.  Psychiatric: Normal judgment and insight. Alert and oriented  x 3. Normal mood. Family assisting with translation.   Labs on Admission: I have personally reviewed following labs and imaging studies  CBC:  Recent Labs Lab 12/04/16 1249 12/05/16 0436  WBC 10.1 13.0*  HGB 13.4 13.3  HCT 43.0 42.0  MCV 73.4* 75.4*  PLT 148* 137*   Basic Metabolic Panel:  Recent Labs Lab 12/04/16 1249 12/05/16 0436 12/05/16 0721  NA 139 136 137  K 5.0 6.3* 4.0  CL 111 110 115*  CO2 20* 19* 17*  GLUCOSE 102* 104* 119*  BUN 16 23* 20  CREATININE 1.76* 2.14* 1.81*  CALCIUM 8.3* 7.9* 6.3*   GFR: Estimated Creatinine Clearance: 31.4 mL/min (A) (by C-G formula based on SCr of 1.81 mg/dL (H)). Liver Function Tests: No results for input(s): AST, ALT, ALKPHOS, BILITOT, PROT, ALBUMIN in the last 168 hours. No results for input(s): LIPASE, AMYLASE in the last  168 hours. No results for input(s): AMMONIA in the last 168 hours. Coagulation Profile: No results for input(s): INR, PROTIME in the last 168 hours. Cardiac Enzymes: No results for input(s): CKTOTAL, CKMB, CKMBINDEX, TROPONINI in the last 168 hours. BNP (last 3 results) No results for input(s): PROBNP in the last 8760 hours. HbA1C: No results for input(s): HGBA1C in the last 72 hours. CBG:  Recent Labs Lab 12/04/16 1957  GLUCAP 138*   Lipid Profile: No results for input(s): CHOL, HDL, LDLCALC, TRIG, CHOLHDL, LDLDIRECT in the last 72 hours. Thyroid Function Tests: No results for input(s): TSH, T4TOTAL, FREET4, T3FREE, THYROIDAB in the last 72 hours. Anemia Panel: No results for input(s): VITAMINB12, FOLATE, FERRITIN, TIBC, IRON, RETICCTPCT in the last 72 hours. Urine analysis:    Component Value Date/Time   COLORURINE STRAW (A) 11/26/2016 0852   APPEARANCEUR CLEAR 11/26/2016 0852   LABSPEC 1.004 (L) 11/26/2016 0852   PHURINE 5.0 11/26/2016 0852   GLUCOSEU NEGATIVE 11/26/2016 0852   HGBUR NEGATIVE 11/26/2016 0852   BILIRUBINUR NEGATIVE 11/26/2016 0852   KETONESUR NEGATIVE 11/26/2016 0852   PROTEINUR NEGATIVE 11/26/2016 0852   UROBILINOGEN 0.2 09/28/2013 1039   NITRITE NEGATIVE 11/26/2016 0852   LEUKOCYTESUR NEGATIVE 11/26/2016 0852   Sepsis Labs: @LABRCNTIP (procalcitonin:4,lacticidven:4) ) Recent Results (from the past 240 hour(s))  Surgical pcr screen     Status: None   Collection Time: 11/26/16  8:52 AM  Result Value Ref Range Status   MRSA, PCR NEGATIVE NEGATIVE Final   Staphylococcus aureus NEGATIVE NEGATIVE Final    Comment:        The Xpert SA Assay (FDA approved for NASAL specimens in patients over 77 years of age), is one component of a comprehensive surveillance program.  Test performance has been validated by [facility] for patients greater than or equal to 77 year old. It is not intended to diagnose infection nor to guide or monitor treatment.     Urine culture     Status: Abnormal   Collection Time: 11/26/16  8:52 AM  Result Value Ref Range Status   Specimen Description URINE, CLEAN CATCH  Final   Special Requests NONE  Final   Culture <10,000 COLONIES/mL INSIGNIFICANT GROWTH (A)  Final   Report Status 11/27/2016 FINAL  Final     Radiological Exams on Admission: No results found.    Assessment/Plan Principal Problem:   Acute renal failure superimposed on stage 3 chronic kidney disease/  Acute hyperkalemia/Acute hypocalcemia -Asked to see Stanley Sullivan regarding worsening of renal function with potassium of 6.3, BUN 23 and calcium 2.14. -Repeat electrolyte panel demonstrated potassium 4.0, BUN  20 and creatinine 1.81 noting only treatment Stanley Sullivan received was IV fluids -Stop antihypertensives in setting of recent suboptimal blood pressures -Hold ARB in setting of AKI and recent hypokalemia -Hold Lasix -Check ionized calcium noting that calcium was 8.8 on preoperative labs and has trended downward steadily; may need to begin calcium replacement -Baseline renal function preop: 19 and 1.90 -Current renal function: 20 and 1.81 -Suspect combination of continued use of diuretic despite IV fluids, suboptimal blood pressure postoperatively, continued use of ARB as well as utilization of short-term NSAIDs have contributed  Active Problems:   Hypertension/Chronic diastolic heart failure, NYHA class 2  -Current blood pressure controlled -In the immediate postoperative period until about 1 AM this morning Stanley Sullivan's blood pressure soft with systolic readings between 90 and 112 -Stanley Sullivan was quite sedated and required the utilization of Narcan overnight when came unresponsive-symptoms improved with administration of Narcan -Stanley Sullivan has developed a dry nonproductive cough-PCXR  Today 8/8 neg for CHF -Currently appears to be compensated regarding diastolic heart failure-Lasix and ARB on hold as above- + 500 cc past 24 hrs    Chronic respiratory  failure with hypoxia 2/2 ILD -On chronic oxygen between 2 and 3 L at home with documented hypoxemia with ambulation-continue -Continue preadmission albuterol MDI -Followed by pulmonary medicine as an outpatient; documented by pulmonary medicine that always not compliant with oxygen at home and does not always keep follow-up appointments -Etiology of ILD symptoms unclear and pulmonologist documents that the Stanley Sullivan is skeptical of proceeding with lung biopsy  -Consideration given to proceeding with right heart catheterization at some point as well -PCXR today (8/8) neg    Primary osteoarthritis of right knee/S/P total knee replacement using cement, left -Per primary orthopedic team       ELLIS,ALLISON L. ANP-BC [facility] Pager [phone]   If 7PM-7AM, please contact night-coverage [email or web address] Password [email or web address]  12/05/2016, 10:03 AM

## 2016-12-06 ENCOUNTER — Encounter (HOSPITAL_COMMUNITY): Payer: Self-pay | Admitting: General Practice

## 2016-12-06 DIAGNOSIS — E875 Hyperkalemia: Secondary | ICD-10-CM

## 2016-12-06 DIAGNOSIS — J849 Interstitial pulmonary disease, unspecified: Secondary | ICD-10-CM | POA: Diagnosis not present

## 2016-12-06 DIAGNOSIS — L539 Erythematous condition, unspecified: Secondary | ICD-10-CM | POA: Diagnosis not present

## 2016-12-06 DIAGNOSIS — I10 Essential (primary) hypertension: Secondary | ICD-10-CM | POA: Diagnosis not present

## 2016-12-06 DIAGNOSIS — J8410 Pulmonary fibrosis, unspecified: Secondary | ICD-10-CM | POA: Diagnosis not present

## 2016-12-06 DIAGNOSIS — J9611 Chronic respiratory failure with hypoxia: Secondary | ICD-10-CM | POA: Diagnosis not present

## 2016-12-06 DIAGNOSIS — Z96651 Presence of right artificial knee joint: Secondary | ICD-10-CM | POA: Diagnosis not present

## 2016-12-06 DIAGNOSIS — D72825 Bandemia: Secondary | ICD-10-CM | POA: Diagnosis not present

## 2016-12-06 DIAGNOSIS — I5032 Chronic diastolic (congestive) heart failure: Secondary | ICD-10-CM | POA: Diagnosis not present

## 2016-12-06 DIAGNOSIS — N179 Acute kidney failure, unspecified: Secondary | ICD-10-CM

## 2016-12-06 DIAGNOSIS — R262 Difficulty in walking, not elsewhere classified: Secondary | ICD-10-CM | POA: Diagnosis not present

## 2016-12-06 DIAGNOSIS — R278 Other lack of coordination: Secondary | ICD-10-CM | POA: Diagnosis not present

## 2016-12-06 DIAGNOSIS — M6281 Muscle weakness (generalized): Secondary | ICD-10-CM | POA: Diagnosis not present

## 2016-12-06 DIAGNOSIS — M1711 Unilateral primary osteoarthritis, right knee: Secondary | ICD-10-CM | POA: Diagnosis not present

## 2016-12-06 DIAGNOSIS — Z96652 Presence of left artificial knee joint: Secondary | ICD-10-CM | POA: Diagnosis not present

## 2016-12-06 DIAGNOSIS — N183 Chronic kidney disease, stage 3 (moderate): Secondary | ICD-10-CM

## 2016-12-06 DIAGNOSIS — Z471 Aftercare following joint replacement surgery: Secondary | ICD-10-CM | POA: Diagnosis not present

## 2016-12-06 DIAGNOSIS — G4733 Obstructive sleep apnea (adult) (pediatric): Secondary | ICD-10-CM | POA: Diagnosis not present

## 2016-12-06 DIAGNOSIS — R488 Other symbolic dysfunctions: Secondary | ICD-10-CM | POA: Diagnosis not present

## 2016-12-06 LAB — BASIC METABOLIC PANEL
Anion gap: 8 (ref 5–15)
BUN: 24 mg/dL — ABNORMAL HIGH (ref 6–20)
CHLORIDE: 111 mmol/L (ref 101–111)
CO2: 18 mmol/L — ABNORMAL LOW (ref 22–32)
Calcium: 8 mg/dL — ABNORMAL LOW (ref 8.9–10.3)
Creatinine, Ser: 1.94 mg/dL — ABNORMAL HIGH (ref 0.61–1.24)
GFR, EST AFRICAN AMERICAN: 37 mL/min — AB (ref >60)
GFR, EST NON AFRICAN AMERICAN: 32 mL/min — AB (ref >60)
Glucose, Bld: 128 mg/dL — ABNORMAL HIGH (ref 65–99)
POTASSIUM: 5 mmol/L (ref 3.5–5.1)
SODIUM: 137 mmol/L (ref 135–145)

## 2016-12-06 LAB — CBC
HCT: 39.4 % (ref 39.0–52.0)
HEMOGLOBIN: 12.8 g/dL — AB (ref 13.0–17.0)
MCH: 24.2 pg — ABNORMAL LOW (ref 26.0–34.0)
MCHC: 32.5 g/dL (ref 30.0–36.0)
MCV: 74.3 fL — ABNORMAL LOW (ref 78.0–100.0)
PLATELETS: 132 10*3/uL — AB (ref 150–400)
RBC: 5.3 MIL/uL (ref 4.22–5.81)
RDW: 16.7 % — ABNORMAL HIGH (ref 11.5–15.5)
WBC: 16.8 10*3/uL — AB (ref 4.0–10.5)

## 2016-12-06 LAB — CALCIUM, IONIZED: Calcium, Ionized, Serum: 3.7 mg/dL — ABNORMAL LOW (ref 4.5–5.6)

## 2016-12-06 MED ORDER — SALINE SPRAY 0.65 % NA SOLN
1.0000 | NASAL | Status: DC | PRN
  Filled 2016-12-06: qty 44

## 2016-12-06 MED ORDER — HYDROCODONE-ACETAMINOPHEN 5-325 MG PO TABS: 1.0000 | ORAL_TABLET | Freq: Four times a day (QID) | ORAL | 0 refills | Status: DC | PRN

## 2016-12-06 MED ORDER — HYDROCODONE-ACETAMINOPHEN 5-325 MG PO TABS
1.0000 | ORAL_TABLET | Freq: Four times a day (QID) | ORAL | Status: DC | PRN
  Administered 2016-12-06: 1 via ORAL
  Filled 2016-12-06: qty 1

## 2016-12-06 MED ORDER — ASPIRIN 325 MG PO TABS: 325.0000 mg | ORAL_TABLET | Freq: Two times a day (BID) | ORAL | Status: DC

## 2016-12-06 NOTE — NC FL2 (Signed)
[place] MEDICAID FL2 LEVEL OF CARE SCREENING TOOL     IDENTIFICATION  Patient Name: Stanley Sullivan Birthdate: June 03, 1940 Sex: male Admission Date (Current Location): 12/04/2016  [facility] and [place] Number:  Producer, television/film/video and Address:  [facility], [address], [place], [place] [address]      Provider Number: [id number]  Attending Physician Name and Address:  Valeria Batman, MD  Relative Name and Phone Number:  Spouse Mrs. Baze    Current Level of Care: Hospital Recommended Level of Care: Skilled Nursing Facility Prior Approval Number:    Date Approved/Denied: 12/06/16 PASRR Number: [id number]  Discharge Plan: SNF    Current Diagnoses: Patient Active Problem List   Diagnosis Date Noted  . Hypertension 12/05/2016  . Acute renal failure superimposed on stage 3 chronic kidney disease (HCC) 12/05/2016  . Chronic respiratory failure with hypoxia (HCC) 12/05/2016  . Chronic diastolic heart failure, NYHA class 2 (HCC) 12/05/2016  . Acute hyperkalemia 12/05/2016  . Hypocalcemia 12/05/2016  . AKI (acute kidney injury) (HCC)   . 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 Interstitial lung disease (HCC) 10/29/2014  . Primary osteoarthritis of right knee 10/29/2014  . DM (diabetes mellitus) type II controlled with renal manifestation (HCC) 10/29/2014  . OSA (obstructive sleep apnea) 10/09/2013  . ILD (interstitial lung disease) (HCC) 10/09/2013  . Cough 10/07/2013  . Dyspnea 10/07/2013  . Osteoarthritis of left knee 08/24/2013  . Benign prostatic hypertrophy 08/24/2013  . Essential hypertension, benign 08/24/2013  . Gout 08/24/2013  . Type II or unspecified type diabetes mellitus without mention of complication, not stated as uncontrolled 08/24/2013  . Chronic idiopathic constipation 04/10/2013  . Dyspnea on exertion 04/10/2013  . Osteoarthrosis, unspecified whether generalized or localized, unspecified site   .  Chronic kidney disease, stage III (moderate)   . Obesity, unspecified   . Gout, unspecified   . Other and unspecified hyperlipidemia   . Essential hypertension   . Hypertrophy of prostate without urinary obstruction and other lower urinary tract symptoms (LUTS)   . Maculopapular rash, generalized 10/02/2012    Orientation RESPIRATION BLADDER Height & Weight     Self, Time, Situation, Place  O2 (Nasal Cannula L) Continent Weight:   Height:     BEHAVIORAL SYMPTOMS/MOOD NEUROLOGICAL BOWEL NUTRITION STATUS      Continent Diet (See Dc summary)  AMBULATORY STATUS COMMUNICATION OF NEEDS Skin   Limited Assist Verbally Surgical wounds (Closed Right Knee Incision with gauze)                       Personal Care Assistance Level of Assistance  Bathing, Feeding, Dressing Bathing Assistance: Limited assistance Feeding assistance: Limited assistance Dressing Assistance: Limited assistance     Functional Limitations Info  Speech     Speech Info:  (Needs Interpreter)    SPECIAL CARE FACTORS FREQUENCY  PT (By licensed PT), OT (By licensed OT)     PT Frequency: 7x week OT Frequency: 2x week            Contractures      Additional Factors Info  Code Status, Allergies Code Status Info: Full Code Allergies Info: No Known Allergies           Current Medications (12/06/2016):  This is the current hospital active medication list Current Facility-Administered Medications  Medication Dose Route Frequency Provider Last Rate Last Dose  . 0.9 %  sodium chloride infusion   Intravenous  Continuous Russella DarEllis, Allison L, NP 100 mL/hr at 12/05/16 0830    . albuterol (PROVENTIL) (2.5 MG/3ML) 0.083% nebulizer solution 2.5 mg  2.5 mg Nebulization Q6H PRN Jetty PeeksPetrarca, Brian D, PA-C      . allopurinol (ZYLOPRIM) tablet 100 mg  100 mg Oral Daily Jacqualine Codeetrarca, Brian D, PA-C   100 mg at 12/06/16 1031  . alum & mag hydroxide-simeth (MAALOX/MYLANTA) 200-200-20 MG/5ML suspension 30 mL  30 mL Oral Q4H PRN  Jacqualine CodePetrarca, Brian D, PA-C      . aspirin tablet 325 mg  325 mg Oral BID PC Jetty Peeksetrarca, Brian D, PA-C   325 mg at 12/06/16 1029  . atorvastatin (LIPITOR) tablet 80 mg  80 mg Oral Daily Jetty Peeksetrarca, Brian D, PA-C   80 mg at 12/06/16 1030  . bisacodyl (DULCOLAX) suppository 10 mg  10 mg Rectal Daily PRN Jetty Peeksetrarca, Brian D, PA-C      . diphenhydrAMINE (BENADRYL) 12.5 MG/5ML elixir 12.5-25 mg  12.5-25 mg Oral Q4H PRN Petrarca, Brian D, PA-C      . docusate sodium (COLACE) capsule 100 mg  100 mg Oral BID Jacqualine Codeetrarca, Brian D, PA-C   100 mg at 12/06/16 1031  . HYDROcodone-acetaminophen (NORCO/VICODIN) 5-325 MG per tablet 1 tablet  1 tablet Oral Q6H PRN Vassie LollMadera, Carlos, MD   1 tablet at 12/06/16 1030  . loratadine (CLARITIN) tablet 10 mg  10 mg Oral Daily Jetty Peeksetrarca, Brian D, PA-C   10 mg at 12/06/16 1031  . magnesium citrate solution 1 Bottle  1 Bottle Oral Once PRN Petrarca, Oris DroneBrian D, PA-C      . menthol-cetylpyridinium (CEPACOL) lozenge 3 mg  1 lozenge Oral PRN Jacqualine CodePetrarca, Brian D, PA-C       Or  . phenol (CHLORASEPTIC) mouth spray 1 spray  1 spray Mouth/Throat PRN Petrarca, Oris DroneBrian D, PA-C      . ondansetron (ZOFRAN) tablet 4 mg  4 mg Oral Q6H PRN Jacqualine CodePetrarca, Brian D, PA-C       Or  . ondansetron (ZOFRAN) injection 4 mg  4 mg Intravenous Q6H PRN Jacqualine CodePetrarca, Brian D, PA-C   4 mg at 12/04/16 2002  . polyethylene glycol (MIRALAX / GLYCOLAX) packet 17 g  17 g Oral Daily PRN Jetty Peeksetrarca, Brian D, PA-C   17 g at 12/05/16 1752  . polyvinyl alcohol (LIQUIFILM TEARS) 1.4 % ophthalmic solution 1 drop  1 drop Both Eyes TID PRN Jetty PeeksPetrarca, Brian D, PA-C      . tamsulosin (FLOMAX) capsule 0.4 mg  0.4 mg Oral Daily Jetty Peeksetrarca, Brian D, PA-C   0.4 mg at 12/06/16 1030     Discharge Medications: Please see discharge summary for a list of discharge medications.  Relevant Imaging Results:  Relevant Lab Results:   Additional Information SS#:[id number]  Tresa MoorePatricia V Vonna Brabson, LCSW

## 2016-12-06 NOTE — Social Work (Signed)
Clinical Social Worker facilitated patient discharge including contacting patient family and facility to confirm patient discharge plans.  Clinical information faxed to facility and family agreeable with plan.    CSW arranged ambulance transport via [facility] to [facility].    RN to call [phone] to give report prior to discharge.  Clinical Social Worker will sign off for now as social work intervention is no longer needed. Please consult us again if new need arises.  Saory Carriero, LCSW Clinical Social Worker [phone]    

## 2016-12-06 NOTE — Progress Notes (Signed)
Subjective: 2 Days Post-Op Procedure(s) (LRB): RIGHT TOTAL KNEE ARTHROPLASTY (Right) Patient reports pain as mild and moderate.    Objective: Vital signs in last 24 hours: Temp:  [97.8 F (36.6 C)] 97.8 F (36.6 C) (08/08 2230) Pulse Rate:  [67-88] 71 (08/09 0004) Resp:  [16-17] 17 (08/08 2230) BP: (149-183)/(47-67) 160/66 (08/09 0004) SpO2:  [92 %-96 %] 96 % (08/08 2230)  Intake/Output from previous day: 08/08 0701 - 08/09 0700 In: 1160 [P.O.:360; I.V.:650] Out: 655 [Urine:550; Drains:105] Intake/Output this shift: Total I/O In: 240 [P.O.:240] Out: -    Recent Labs  12/04/16 1249 12/05/16 0436 12/06/16 0550  HGB 13.4 13.3 12.8*    Recent Labs  12/05/16 0436 12/06/16 0550  WBC 13.0* 16.8*  RBC 5.57 5.30  HCT 42.0 39.4  PLT 137* 132*    Recent Labs  12/05/16 0721 12/06/16 0550  NA 137 137  K 4.0 5.0  CL 115* 111  CO2 17* 18*  BUN 20 24*  CREATININE 1.81* 1.94*  GLUCOSE 119* 128*  CALCIUM 6.3* 8.0*   No results for input(s): LABPT, INR in the last 72 hours.  Neurovascular intact Intact pulses distally Dorsiflexion/Plantar flexion intact Incision: no drainage Compartment soft  Assessment/Plan: 2 Days Post-Op Procedure(s) (LRB): RIGHT TOTAL KNEE ARTHROPLASTY (Right) Discharge to SNF  today Stanley Sullivan 12/06/2016, 10:44 AM

## 2016-12-06 NOTE — Progress Notes (Signed)
PATIENT ID: LINTON STOLP        MRN:  [id number]          DOB/AGE: 77/14/1942 / 77 y.o.    Norlene Campbell, MD   Jacqualine Code, PA-C [address] [place], [place]  [address]                             [phone]   PROGRESS NOTE  Subjective:  negative for Chest Pain  negative for Shortness of Breath  negative for Nausea/Vomiting   negative for Calf Pain    Tolerating Diet: yes         Patient reports pain as mild and moderate.     Sitting up in chair with family present-comfortable, no complaints  Objective: Vital signs in last 24 hours:   Patient Vitals for the past 24 hrs:  BP Temp Temp src Pulse Resp SpO2  12/06/16 0004 (!) 160/66 - - 71 - -  12/05/16 2330 (!) 183/53 - - - - -  12/05/16 2230 (!) 175/67 97.8 F (36.6 C) Oral 88 17 96 %  12/05/16 1359 (!) 149/47 97.8 F (36.6 C) Oral 67 16 92 %      Intake/Output from previous day:   08/08 0701 - 08/09 0700 In: 1160 [P.O.:360; I.V.:650] Out: 655 [Urine:550; Drains:105]   Intake/Output this shift:   08/09 0701 - 08/09 1900 In: 240 [P.O.:240] Out: -    Intake/Output      08/08 0701 - 08/09 0700 08/09 0701 - 08/10 0700   P.O. 360 240   I.V. 650    Other 150    Total Intake 1160 240   Urine 550    Drains 105    Total Output 655     Net +505 +240        Urine Occurrence 1 x       LABORATORY DATA:  Recent Labs  12/04/16 1249 12/05/16 0436 12/06/16 0550  WBC 10.1 13.0* 16.8*  HGB 13.4 13.3 12.8*  HCT 43.0 42.0 39.4  PLT 148* 137* 132*    Recent Labs  12/04/16 1249 12/05/16 0436 12/05/16 0721 12/06/16 0550  NA 139 136 137 137  K 5.0 6.3* 4.0 5.0  CL 111 110 115* 111  CO2 20* 19* 17* 18*  BUN 16 23* 20 24*  CREATININE 1.76* 2.14* 1.81* 1.94*  GLUCOSE 102* 104* 119* 128*  CALCIUM 8.3* 7.9* 6.3* 8.0*   Lab Results  Component Value Date   INR 1.12 11/26/2016    Recent Radiographic Studies :  Dg Chest Port 1 View  Result Date: 12/05/2016 CLINICAL DATA:  Cough. One day postop from  right knee arthroplasty. Chronic kidney disease stage 3. EXAM: PORTABLE CHEST 1 VIEW COMPARISON:  03/11/2015 FINDINGS: The heart size and mediastinal contours are within normal limits. Low lung volumes are noted, however both lungs are clear. No evidence of pneumothorax or pleural effusion. The visualized skeletal structures are unremarkable. IMPRESSION: Low lung volumes.  No active disease. Electronically Signed   By: Myles Rosenthal M.D.   On: 12/05/2016 11:35     Examination:  General appearance: alert, cooperative and no distress  Wound Exam: clean, dry, intact   Drainage:  Scant/small amount Serosanguinous exudate In hemovac Motor Exam: EHL, FHL, Anterior Tibial and Posterior Tibial Intact  Sensory Exam: Superficial Peroneal, Deep Peroneal and Tibial normal  Vascular Exam: Normal  Assessment:    2 Days Post-Op  Procedure(s) (LRB):  RIGHT TOTAL KNEE ARTHROPLASTY (Right)  ADDITIONAL DIAGNOSIS:  Principal Problem:   Acute renal failure superimposed on stage 3 chronic kidney disease (HCC) Active Problems:   Primary osteoarthritis of right knee   S/P total knee replacement using cement, left   Hypertension   Chronic respiratory failure with hypoxia (HCC)   Chronic diastolic heart failure, NYHA class 2 (HCC)   Acute hyperkalemia   Hypocalcemia   AKI (acute kidney injury) (HCC)     Plan: Physical Therapy as ordered Partial Weight Bearing @ 50% (PWB)  DVT Prophylaxis:  Aspirin, Foot Pumps and TED hose  DISCHARGE PLAN: Skilled Nursing Facility/Rehab  DISCHARGE NEEDS: HHPT, CPM, Walker and 3-in-1 comode seat Lab stable, wound clean and dry, dressing changed, hemovac out, voiding without difficulty, needs BM-will plan on discharge to rehab facility today       Valeria Batmaneter W Damyia Strider  12/06/2016 10:54 AM  Patient ID: Adonis HugueninManilal N Shimon, male   DOB: Oct 01, 1940, 76 y.o.   MRN: [id number]

## 2016-12-06 NOTE — Progress Notes (Signed)
[facility] PROGRESS NOTE  Stanley Sullivan ZOX:[id number] DOB: [birth year] DOA: 12/04/2016 PCP: Wilson SingerJariwala, Arvind N, MD  Interim summary and HPI 76 y.o. 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 Marchelle Gearingamaswamy, hypertension, grade 2 diastolic dysfunction, stage III chronic kidney and dyslipidemia; who was admitted for right TKR and after surgery developed acute on chronic renal failure and hyperkalemia. [facility] hospital[facility] was consulted for assistance taking care of this conditions and looking after his chronic medical problems.  Assessment/Plan: 1-acute on chronic renal failure: stage 3 at baseline -Cr baseline 1.5-1.9 -multifactorial: including transient hypotension, continue use of nephrotoxic agents and most likely mild dehydration around pre-surgery and subsequent increased somnolent and not intake after surgery. -renal function improved/returned to baseline with IVF's -his chronic ARB and diuretics, has been placed on hold for the next 2 days -advise to keep and maintain himself well hydrated  -Cr at discharge 1.9  2-HTN -soft/slightly low after surgery -now stable and rising -will resume metoprolol -his lasix and cozaar to be resumed on 8/11 -advise to follow low sodium diet   3-chronic diastolic heart failure -compensated overall -continue daily weights and strict I's and O's -will continue b-blockers -on 8/11 will be safe to resume ARB and diuretics -advise to follow low sodium diet   4-hyperkalemia -in setting of ARF and use of cozaar -resolved with IVF's -continue holding cozaar until 8/11 -anticipating that he will remained stable/equilibrated with resumption of diuretics as well.  5-Primary OA of Right knee -s/p TKR -will need SNF at discharge for rehab -further care and post operative rec's as per orthopedic service  6-chronic resp failure with hypoxia due to ILD -continue oxygen supplementation and home inhalers regimen -continue  follow up with pulmonary service  Code Status: Full Family Communication: Son and wife at bedside  Disposition Plan: patient stable and with resolution of acute renal failure; patient stable and clear from IM stand point to be discharge to SNF for rehabilitation.  Procedures:  Right TKR  Antibiotics:  None   HPI/Subjective: Afebrile, stable and denying CP or SOB. Patient was AAOX3, no nausea, no acute distress and reporting good urine output.  Objective: Vitals:   12/05/16 2330 12/06/16 0004  BP: (!) 183/53 (!) 160/66  Pulse:  71  Resp:    Temp:    SpO2:      Intake/Output Summary (Last 24 hours) at 12/06/16 1102 Last data filed at 12/06/16 1000  Gross per 24 hour  Intake             1400 ml  Output              655 ml  Net              745 ml   There were no vitals filed for this visit.  Exam:   General:  Afebrile, no CP, no SOB. AAOX3. Hemodynamically stable and reporting good urine output.  Cardiovascular: S1 and S2, no rubs or gallops  Respiratory: good air movement bilaterally; no wheezing  Abdomen: soft, NT, ND, positive BS  Musculoskeletal: no cyanosis or clubbing, trace edema appreciated bilaterally; patient also with trace edema bilaterally and with right knee dressings in place (dressings clean and intact)  Data Reviewed: Basic Metabolic Panel:  Recent Labs Lab 12/04/16 1249 12/05/16 0436 12/05/16 0721 12/06/16 0550  NA 139 136 137 137  K 5.0 6.3* 4.0 5.0  CL 111 110 115* 111  CO2 20* 19* 17* 18*  GLUCOSE 102* 104*  119* 128*  BUN 16 23* 20 24*  CREATININE 1.76* 2.14* 1.81* 1.94*  CALCIUM 8.3* 7.9* 6.3* 8.0*   Liver Function Tests: No results for input(s): AST, ALT, ALKPHOS, BILITOT, PROT, ALBUMIN in the last 168 hours. No results for input(s): LIPASE, AMYLASE in the last 168 hours. No results for input(s): AMMONIA in the last 168 hours. CBC:  Recent Labs Lab 12/04/16 1249 12/05/16 0436 12/06/16 0550  WBC 10.1 13.0* 16.8*  HGB  13.4 13.3 12.8*  HCT 43.0 42.0 39.4  MCV 73.4* 75.4* 74.3*  PLT 148* 137* 132*   CBG:  Recent Labs Lab 12/04/16 1957  GLUCAP 138*   Studies: Dg Chest Port 1 View  Result Date: 12/05/2016 CLINICAL DATA:  Cough. One day postop from right knee arthroplasty. Chronic kidney disease stage 3. EXAM: PORTABLE CHEST 1 VIEW COMPARISON:  03/11/2015 FINDINGS: The heart size and mediastinal contours are within normal limits. Low lung volumes are noted, however both lungs are clear. No evidence of pneumothorax or pleural effusion. The visualized skeletal structures are unremarkable. IMPRESSION: Low lung volumes.  No active disease. Electronically Signed   By: Myles Rosenthal M.D.   On: 12/05/2016 11:35    Scheduled Meds: . allopurinol  100 mg Oral Daily  . aspirin  325 mg Oral BID PC  . atorvastatin  80 mg Oral Daily  . docusate sodium  100 mg Oral BID  . loratadine  10 mg Oral Daily  . tamsulosin  0.4 mg Oral Daily   Continuous Infusions: . sodium chloride 100 mL/hr at 12/05/16 0830    Principal Problem:   Acute renal failure superimposed on stage 3 chronic kidney disease (HCC) Active Problems:   Primary osteoarthritis of right knee   S/P total knee replacement using cement, left   Hypertension   Chronic respiratory failure with hypoxia (HCC)   Chronic diastolic heart failure, NYHA class 2 (HCC)   Acute hyperkalemia   Hypocalcemia   AKI (acute kidney injury) (HCC)    Time spent: 25    Vassie Loll  [facility] Pager [phone] If 7PM-7AM, please contact night-coverage at [email or web address], password [facility] 12/06/2016, 11:02 AM  LOS: 2 days

## 2016-12-06 NOTE — Discharge Summary (Signed)
Norlene Campbell, MD   Jacqualine Code, PA-C [address], [place], [place]  [address]                             [phone]  PATIENT ID: Stanley Sullivan        MRN:  [id number]          DOB/AGE: [birth year] / 77 y.o.    DISCHARGE SUMMARY  ADMISSION DATE:    12/04/2016 DISCHARGE DATE:   12/06/2016   ADMISSION DIAGNOSIS: RIGHT KNEE OSTEOARTHRITIS    DISCHARGE DIAGNOSIS:  RIGHT KNEE OSTEOARTHRITIS    ADDITIONAL DIAGNOSIS: Principal Problem:   Acute renal failure superimposed on stage 3 chronic kidney disease (HCC) Active Problems:   Primary osteoarthritis of right knee   S/P total knee replacement using cement, left   Hypertension   Chronic respiratory failure with hypoxia (HCC)   Chronic diastolic heart failure, NYHA class 2 (HCC)   Acute hyperkalemia   Hypocalcemia   AKI (acute kidney injury) (HCC)  Past Medical History:  Diagnosis Date  . Allergy, unspecified not elsewhere classified   . Chronic kidney disease, stage III (moderate)    patient denies  . External hemorrhoids without mention of complication   . Gout, unspecified   . Hypertension   . Hypertrophy of prostate without urinary obstruction and other lower urinary tract symptoms (LUTS)   . Obesity, unspecified   . On home oxygen therapy    in the morning and evening, with exertion, as needed when O2 drops to 80s  . Organic sleep apnea, unspecified    CPAP, presure setting 3  . Osteoarthrosis, unspecified whether generalized or localized, unspecified site   . Other abnormal glucose   . Other and unspecified hyperlipidemia   . Other atopic dermatitis and related conditions   . Other specified visual disturbances   . Pain in joint, lower leg   . Pain in limb   . Unspecified disorder of kidney and ureter   . Unspecified essential hypertension   . Urticaria, unspecified     PROCEDURE: Procedure(s): RIGHT TOTAL KNEE ARTHROPLASTY  on 12/04/2016  CONSULTS: [facility]    HISTORY: Mr. Kalmar is a very  pleasant 76 year old male who is seen today for history of right knee pain for the last 3-4 years. He had a gradual onset of pain discomfort at that time and had gradual worsening. He did not have any other surgical procedures to the knee noted. History conservatively with over-the-counter NSAIDs and activity modification. His pain was rated at the 4-5 out of 10 with activity. He did also have nighttime pain. He has had corticosteroid injections in the past. No Visco supplementation though.  HOSPITAL COURSE:  MAKAYLA CONFER is a 76 y.o. admitted on 12/04/2016 and found to have a diagnosis of RIGHT KNEE OSTEOARTHRITIS.  After appropriate laboratory studies were obtained  they were taken to the operating room on 12/04/2016 and underwent  Procedure(s): RIGHT TOTAL KNEE ARTHROPLASTY  .   They were given perioperative antibiotics:  Anti-infectives    Start     Dose/Rate Route Frequency Ordered Stop   12/04/16 1630  ceFAZolin (ANCEF) IVPB 2g/100 mL premix     2 g 200 mL/hr over 30 Minutes Intravenous Every 6 hours 12/04/16 0946 12/05/16 0429   12/04/16 0700  ceFAZolin (ANCEF) IVPB 2g/100 mL premix     2 g 200 mL/hr over 30 Minutes Intravenous To ShortStay Surgical 12/03/16 1334 12/04/16 0802    .  Tolerated the procedure well.  Placed with a foley intraoperatively.     Toradol was given post op. Had to administer Narcan because of hypoxia and non responsive.  Initially a rocky postop course. O2 continues and telemetry begun.  POD #1, Initial potassium level was 6.3 on early morning draw.  STAT K was 4.0.  Hospitalist was consulted and cared for him from medical standpoint.  He improved. He was allowed out of bed to a chair.  PT for ambulation and exercise program.  IV saline continued.    POD #2, continued PT and ambulation.  Hemovac pulled. Dressing changed.  From Hospitalist standpoint he was cleared for D/C to SNF with holding of his Lasix and Cozaar until Saturday.  The remainder of the hospital  course was dedicated to ambulation and strengthening.   The patient was discharged on 2 Days Post-Op in  Stable condition.  Blood products given:none  DIAGNOSTIC STUDIES: Recent vital signs:  Patient Vitals for the past 24 hrs:  BP Temp Temp src Pulse Resp SpO2  12/06/16 0004 (!) 160/66 - - 71 - -  12/05/16 2330 (!) 183/53 - - - - -  12/05/16 2230 (!) 175/67 97.8 F (36.6 C) Oral 88 17 96 %  12/05/16 1359 (!) 149/47 97.8 F (36.6 C) Oral 67 16 92 %       Recent laboratory studies:  Recent Labs  12/04/16 1249 12/05/16 0436 12/06/16 0550  WBC 10.1 13.0* 16.8*  HGB 13.4 13.3 12.8*  HCT 43.0 42.0 39.4  PLT 148* 137* 132*    Recent Labs  12/04/16 1249 12/05/16 0436 12/05/16 0721 12/06/16 0550  NA 139 136 137 137  K 5.0 6.3* 4.0 5.0  CL 111 110 115* 111  CO2 20* 19* 17* 18*  BUN 16 23* 20 24*  CREATININE 1.76* 2.14* 1.81* 1.94*  GLUCOSE 102* 104* 119* 128*  CALCIUM 8.3* 7.9* 6.3* 8.0*   Lab Results  Component Value Date   INR 1.12 11/26/2016     Recent Radiographic Studies :  Dg Chest Port 1 View  Result Date: 12/05/2016 CLINICAL DATA:  Cough. One day postop from right knee arthroplasty. Chronic kidney disease stage 3. EXAM: PORTABLE CHEST 1 VIEW COMPARISON:  03/11/2015 FINDINGS: The heart size and mediastinal contours are within normal limits. Low lung volumes are noted, however both lungs are clear. No evidence of pneumothorax or pleural effusion. The visualized skeletal structures are unremarkable. IMPRESSION: Low lung volumes.  No active disease. Electronically Signed   By: Myles Rosenthal M.D.   On: 12/05/2016 11:35    DISCHARGE INSTRUCTIONS: Discharge Instructions    CPM    Complete by:  As directed    Continuous passive motion machine (CPM):      Use the CPM from 0 to 60 for 6-8 hours per day.      You may increase by 5-10 degrees per day.  You may break it up into 2 or 3 sessions per day.      Use CPM for 3-4  weeks or until you are told to stop.   Call MD /  Call 911    Complete by:  As directed    If you experience chest pain or shortness of breath, CALL 911 and be transported to the hospital emergency room.  If you develope a fever above 101 F, pus (white drainage) or increased drainage or redness at the wound, or calf pain, call your surgeon's office.   Change dressing    Complete  by:  As directed    DO NOT CHANGE YOUR DRESSING   Constipation Prevention    Complete by:  As directed    Drink plenty of fluids.  Prune juice may be helpful.  You may use a stool softener, such as Colace (over the counter) 100 mg twice a day.  Use MiraLax (over the counter) for constipation as needed.   Diet general    Complete by:  As directed    Discharge instructions    Complete by:  As directed    INSTRUCTIONS AFTER JOINT REPLACEMENT   Remove items at home which could result in a fall. This includes throw rugs or furniture in walking pathways ICE to the affected joint every three hours while awake for 30 minutes at a time, for at least the first 3-5 days, and then as needed for pain and swelling.  Continue to use ice for pain and swelling. You may notice swelling that will progress down to the foot and ankle.  This is normal after surgery.  Elevate your leg when you are not up walking on it.   Continue to use the breathing machine you got in the hospital (incentive spirometer) which will help keep your temperature down.  It is common for your temperature to cycle up and down following surgery, especially at night when you are not up moving around and exerting yourself.  The breathing machine keeps your lungs expanded and your temperature down.   DIET:  As you were doing prior to hospitalization, we recommend a well-balanced diet.  DRESSING / WOUND CARE / SHOWERING  Keep the surgical dressing until follow up.  The dressing is water proof, so you can shower without any extra covering.  IF THE DRESSING FALLS OFF or the wound gets wet inside, change the dressing  with sterile gauze.  Please use good hand washing techniques before changing the dressing.  Do not use any lotions or creams on the incision until instructed by your surgeon.    ACTIVITY  Increase activity slowly as tolerated, but follow the weight bearing instructions below.   No driving for 6 weeks or until further direction given by your physician.  You cannot drive while taking narcotics.  No lifting or carrying greater than 10 lbs. until further directed by your surgeon. Avoid periods of inactivity such as sitting longer than an hour when not asleep. This helps prevent blood clots.  You may return to work once you are authorized by your doctor.     WEIGHT BEARING   Partial weight bearing with assist device as directed.  50%   EXERCISES  Results after joint replacement surgery are often greatly improved when you follow the exercise, range of motion and muscle strengthening exercises prescribed by your doctor. Safety measures are also important to protect the joint from further injury. Any time any of these exercises cause you to have increased pain or swelling, decrease what you are doing until you are comfortable again and then slowly increase them. If you have problems or questions, call your caregiver or physical therapist for advice.   Rehabilitation is important following a joint replacement. After just a few days of immobilization, the muscles of the leg can become weakened and shrink (atrophy).  These exercises are designed to build up the tone and strength of the thigh and leg muscles and to improve motion. Often times heat used for twenty to thirty minutes before working out will loosen up your tissues and help with improving the  range of motion but do not use heat for the first two weeks following surgery (sometimes heat can increase post-operative swelling).   These exercises can be done on a training (exercise) mat, on the floor, on a table or on a bed. Use whatever works the  best and is most comfortable for you.    Use music or television while you are exercising so that the exercises are a pleasant break in your day. This will make your life better with the exercises acting as a break in your routine that you can look forward to.   Perform all exercises about fifteen times, three times per day or as directed.  You should exercise both the operative leg and the other leg as well.   Exercises include:  Quad Sets - Tighten up the muscle on the front of the thigh (Quad) and hold for 5-10 seconds.   Straight Leg Raises - With your knee straight (if you were given a brace, keep it on), lift the leg to 60 degrees, hold for 3 seconds, and slowly lower the leg.  Perform this exercise against resistance later as your leg gets stronger.  Leg Slides: Lying on your back, slowly slide your foot toward your buttocks, bending your knee up off the floor (only go as far as is comfortable). Then slowly slide your foot back down until your leg is flat on the floor again.  Angel Wings: Lying on your back spread your legs to the side as far apart as you can without causing discomfort.  Hamstring Strength:  Lying on your back, push your heel against the floor with your leg straight by tightening up the muscles of your buttocks.  Repeat, but this time bend your knee to a comfortable angle, and push your heel against the floor.  You may put a pillow under the heel to make it more comfortable if necessary.   A rehabilitation program following joint replacement surgery can speed recovery and prevent re-injury in the future due to weakened muscles. Contact your doctor or a physical therapist for more information on knee rehabilitation.    CONSTIPATION  Constipation is defined medically as fewer than three stools per week and severe constipation as less than one stool per week.  Even if you have a regular bowel pattern at home, your normal regimen is likely to be disrupted due to multiple reasons  following surgery.  Combination of anesthesia, postoperative narcotics, change in appetite and fluid intake all can affect your bowels.   YOU MUST use at least one of the following options; they are listed in order of increasing strength to get the job done.  They are all available over the counter, and you may need to use some, POSSIBLY even all of these options:    Drink plenty of fluids (prune juice may be helpful) and high fiber foods Colace 100 mg by mouth twice a day  Senokot for constipation as directed and as needed Dulcolax (bisacodyl), take with full glass of water  Miralax (polyethylene glycol) once or twice a day as needed.  If you have tried all these things and are unable to have a bowel movement in the first 3-4 days after surgery call either your surgeon or your primary doctor.    If you experience loose stools or diarrhea, hold the medications until you stool forms back up.  If your symptoms do not get better within 1 week or if they get worse, check with your doctor.  If you  experience "the worst abdominal pain ever" or develop nausea or vomiting, please contact the office immediately for further recommendations for treatment.   ITCHING:  If you experience itching with your medications, try taking only a single pain pill, or even half a pain pill at a time.  You can also use Benadryl over the counter for itching or also to help with sleep.   TED HOSE STOCKINGS:  Use stockings on both legs until for at least 2 weeks or as directed by physician office. They may be removed at night for sleeping.  MEDICATIONS:  See your medication summary on the "After Visit Summary" that nursing will review with you.  You may have some home medications which will be placed on hold until you complete the course of blood thinner medication.  It is important for you to complete the blood thinner medication as prescribed.  PRECAUTIONS:  If you experience chest pain or shortness of breath - call 911  immediately for transfer to the hospital emergency department.   If you develop a fever greater that 101 F, purulent drainage from wound, increased redness or drainage from wound, foul odor from the wound/dressing, or calf pain - CONTACT YOUR SURGEON.                                                   FOLLOW-UP APPOINTMENTS:  If you do not already have a post-op appointment, please call the office for an appointment to be seen by your surgeon.  Guidelines for how soon to be seen are listed in your "After Visit Summary", but are typically between 1-4 weeks after surgery.  OTHER INSTRUCTIONS:   Knee Replacement:  Do not place pillow under knee, focus on keeping the knee straight while resting. CPM instructions: 0-90 degrees, 2 hours in the morning, 2 hours in the afternoon, and 2 hours in the evening. Place foam block, curve side up under heel at all times except when in CPM or when walking.  DO NOT modify, tear, cut, or change the foam block in any way.  MAKE SURE YOU:  Understand these instructions.  Get help right away if you are not doing well or get worse.    Thank you for letting us be a part of your medical care team.  It is a privilege we respect greatly.  We hope these instructions will help you stay on track for a fast and full recovery!   Do not put a pillow under the knee. Place it under the heel.    Complete by:  As directed    Driving restrictions    Complete by:  As directed    No driving for 6 weeks   Increase activity slowly as tolerated    Complete by:  As directed    Lifting restrictions    Complete by:  As directed    No lifting for 6 weeks   Partial weight bearing    Complete by:  As directed    % Body Weight:  50%   Laterality:  right   Extremity:  Lower   Patient may shower    Complete by:  As directed    You may shower over the brown dressing   TED hose    Complete by:  As directed    Use stockings (TED hose) for 2-3 weeks on right  leg.  You may remove them at  night for sleeping.      DISCHARGE MEDICATIONS:   Allergies as of 12/06/2016      Reactions   No Known Allergies       Medication List    STOP taking these medications   acetaminophen 500 MG tablet Commonly known as:  TYLENOL     TAKE these medications   albuterol 108 (90 Base) MCG/ACT inhaler Commonly known as:  PROVENTIL HFA;VENTOLIN HFA Inhale 2 puffs into the lungs every 6 (six) hours as needed for wheezing or shortness of breath (or cough). What changed:  additional instructions   allopurinol 100 MG tablet Commonly known as:  ZYLOPRIM Take 1 tablet (100 mg total) by mouth daily. For gout   aspirin 325 MG tablet Take 1 tablet (325 mg total) by mouth 2 (two) times daily after a meal.   atorvastatin 80 MG tablet Commonly known as:  LIPITOR Take 80 mg by mouth daily.   CALCIUM PO Take 1 tablet by mouth daily.   cholecalciferol 1000 units tablet Commonly known as:  VITAMIN D Take 1,000 Units by mouth daily.   furosemide 20 MG tablet  Commonly known as:  LASIX Take 1 tablet (20 mg total) by mouth daily. For swelling of legs ** HOLD UNTIL Saturday **   HYDROcodone-acetaminophen 5-325 MG tablet Commonly known as:  NORCO/VICODIN Take 1 tablet by mouth every 6 (six) hours as needed for moderate pain.   KLS ALLER-TEC 10 MG tablet Generic drug:  cetirizine Take 10 mg by mouth daily as needed (for allergies.).   losartan 100 MG tablet Commonly known as:  COZAAR Take 100 mg by mouth daily.** HOLD UNTIL Saturday **   LUBRICANT DROPS 1.4 % ophthalmic solution Generic drug:  polyvinyl alcohol Place 1 drop into both eyes 3 (three) times daily as needed for dry eyes.   metoprolol succinate 100 MG 24 hr tablet Commonly known as:  TOPROL-XL TAKE 1 TABLET (100 MG TOTAL) BY MOUTH DAILY.   tamsulosin 0.4 MG Caps capsule Commonly known as:  FLOMAX TAKE 1 CAPSULE BY MOUTH ONCE A DAY FOR YOUR PROSTATE   Vitamin B-12 2500 MCG Subl Place 2,500 mcg under the tongue daily.              Durable Medical Equipment        Start     Ordered   12/04/16 1609  DME Walker rolling  Once    Question:  Patient needs a walker to treat with the following condition  Answer:  S/P total knee replacement using cement, right   12/04/16 1608   12/04/16 1609  DME 3 n 1  Once     12/04/16 1608   12/04/16 1609  DME Bedside commode  Once    Question:  Patient needs a bedside commode to treat with the following condition  Answer:  S/P total knee replacement using cement, right   12/04/16 1608      FOLLOW UP VISIT:    Contact information for follow-up providers    Valeria Batman, MD Follow up on 12/17/2016.   Specialty:  Orthopedic Surgery Contact information: [address] [place] [address] [phone]            Contact information for after-discharge care    Destination    [facility] Follow up.   Specialty:  Skilled Nursing Facility Contact information: [address] [place] [address] [phone]  DISPOSITION:   Skilled Nursing Facility/Rehab  CONDITION:  Stable   Oris DroneBrian D. Aleda Granaetrarca, PA-C [facility] [phone]  12/06/2016 11:21 AM

## 2016-12-06 NOTE — Clinical Social Work Note (Signed)
Clinical Social Work Assessment  Patient Details  Name: Stanley Sullivan MRN: [id number] Date of Birth: 29-Jun-1940  Date of referral:  12/06/16               Reason for consult:  Facility Placement                Permission sought to share information with:  Facility Sport and exercise psychologist Permission granted to share information::  Yes, Verbal Permission Granted  Name::     Mrs. [place]::  SNF-  Relationship::     Contact Information:     Housing/Transportation Living arrangements for the past 2 months:  [place] of Information:  Spouse Patient Interpreter Needed:  Other (Comment Required) Criminal Activity/Legal Involvement Pertinent to Current Situation/Hospitalization:  No - Comment as needed Significant Relationships:  Adult Children, Spouse Lives with:  Spouse Do you feel safe going back to the place where you live?  No Need for family participation in patient care:  Yes (Comment)  Care giving concerns:  Pt from home with spouse and has struggled with Right knee pain for some time. Pt not safe to return home with new impairment at this time as spouse is unable to assist. Family agreeable to SNF.  Social Worker assessment / plan:  CSW met with patient/spouse at bedside to discuss clinical teams recommendation for DC to SNF. Patient/family agreeable with SNF and would like to go to [facility]. CSW discussed role and SNF options/placement. CSW obtained permission to send to [facility] in local area including [facility].   FL2 complete. Passr obtained. Offers sent.  Employment status:  Retired Nurse, adult PT Recommendations:  [place] / Referral to community resources:  [place]  Patient/Family's Response to care:  Patient/spouse are appreciative of CSW assistance. No issues or concerns at this time.  Patient/Family's Understanding of and Emotional Response to Diagnosis, Current Treatment,  and Prognosis:  Patient/spouse has good understanding of diagnosis, current treatment and prognosis. Patient/Spouse hopeful that impairment will improve with SNF. No issues or concerns identified.  Emotional Assessment Appearance:  Appears stated age Attitude/Demeanor/Rapport:   (Cooperative) Affect (typically observed):  Accepting, Appropriate Orientation:  Oriented to Self, Oriented to Place, Oriented to  Time, Oriented to Situation Alcohol / Substance use:  Not Applicable Psych involvement (Current and /or in the community):  No (Comment)  Discharge Needs  Concerns to be addressed:  Care Coordination Readmission within the last 30 days:  No Current discharge risk:  Dependent with Mobility, Physical Impairment Barriers to Discharge:  No Barriers Identified   Normajean Baxter, LCSW 12/06/2016, 10:57 AM

## 2016-12-06 NOTE — Clinical Social Work Placement (Signed)
   CLINICAL SOCIAL WORK PLACEMENT  NOTE  Date:  12/06/2016  Patient Details  Name: Stanley HugueninManilal N Vidrine MRN: [id number] Date of Birth: 02-28-1941  Clinical Social Work is seeking post-discharge placement for this patient at the Skilled  Nursing Facility level of care (*CSW will initial, date and re-position this form in  chart as items are completed):  Yes   Patient/family provided with [facility] Work Department's list of facilities offering this level of care within the geographic area requested by the patient (or if unable, by the patient's family).  Yes   Patient/family informed of their freedom to choose among providers that offer the needed level of care, that participate in Medicare, Medicaid or managed care program needed by the patient, have an available bed and are willing to accept the patient.  Yes   Patient/family informed of 's ownership interest in [facility] and [facility], as well as of the fact that they are under no obligation to receive care at these facilities.  PASRR submitted to EDS on       PASRR number received on 12/06/16     Existing PASRR number confirmed on       FL2 transmitted to all facilities in geographic area requested by pt/family on 12/06/16     FL2 transmitted to all facilities within larger geographic area on       Patient informed that his/her managed care company has contracts with or will negotiate with certain facilities, including the following:        Yes   Patient/family informed of bed offers received.  Patient [facility]     Physician recommends and patient chooses bed at      Patient to [facility] on 12/06/16.  Patient to be transferred to facility by [facility]AR     Patient family notified on 12/06/16 of transfer.  Name of family member notified:  family at bedside, son involved as well     PHYSICIAN Please prepare priority discharge  summary, including medications, Please sign FL2, Please prepare prescriptions     Additional Comment:    _______________________________________________ Tresa MoorePatricia V Jacquese Cassarino, LCSW 12/06/2016, 11:49 AM

## 2016-12-06 NOTE — Discharge Instructions (Signed)
Total Knee Replacement, Care After These instructions give you information about caring for yourself after your procedure. Your doctor may also give you more specific instructions. Call your doctor if you have any problems or questions after your procedure. Follow these instructions at home: Medicines  Take over-the-counter and prescription medicines only as told by your doctor.  If you were prescribed an antibiotic medicine, take it as told by your doctor. Do not stop taking the antibiotic even if you start to feel better.  If you were prescribed a blood thinner (anticoagulant), take it as told by your doctor. If you have a splint or brace:  Wear the splint or brace as told by your doctor. Remove it only as told by your doctor.  Loosen the splint or brace if your toes tingle, get numb, or turn cold and blue.  Do not let your splint or brace get wet if it is not waterproof.  Keep the splint or brace clean. Bathing   Do not take baths, swim, or use a hot tub until your doctor says it is okay. Ask your doctor if you can take showers. You may only be allowed to take sponge baths for bathing.  If you have a splint or brace that is not waterproof, cover it with a watertight covering when you take a bath or a shower.  Keep your bandage (dressing) dry until your doctor says it can be taken off. Incision care and drain care  Check your cut from surgery (incision) and your drain every day for signs of infection. Check for: ? More redness, swelling, or pain. ? More fluid or blood. ? Warmth. ? Pus or a bad smell.  Follow instructions from your doctor about how to take care of your cut from surgery. Make sure you: ? Wash your hands with soap and water before you change your bandage. If you cannot use soap and water, use hand sanitizer. ? Change your bandage as told by your doctor. ? Leave stitches (sutures), skin glue, or skin tape (adhesive) strips in place. They may need to stay in place  for 2 weeks or longer. If tape strips get loose and curl up, you may trim the loose edges. Do not remove tape strips completely unless your doctor says it is okay.  If you have a drain, follow instructions from your doctor about caring for it. Do not remove the drain tube or any bandages unless your doctor says it is okay. Managing pain, stiffness, and swelling   If directed, put ice on your knee. ? Put ice in a plastic bag. ? Place a towel between your skin and the bag. ? Leave the ice on for 20 minutes, 2-3 times per day.  If directed, apply heat to the affected area as often as told by your doctor. Use the heat source that your doctor recommends, such as a moist heat pack or a heating pad. ? Place a towel between your skin and the heat source. ? Leave the heat on for 20-30 minutes. ? Remove the heat if your skin turns bright red. This is especially important if you are unable to feel pain, heat, or cold. You may have a greater risk of getting burned.  Move your toes often to avoid stiffness and to lessen swelling.  Raise (elevate) your knee above the level of your heart while you are sitting or lying down.  Wear elastic knee support for as long as told by your doctor. Driving   Do not   drive until your doctor says it is okay. Ask your doctor when it is safe to drive if you have a splint or brace on your knee.  Do not drive or use heavy machinery while taking prescription pain medicine.  Do not drive for 24 hours if you received a sedative. Activity  Do not lift anything that is heavier than 10 lb (4.5 kg) until your doctor says it is okay.  Do not play contact sports until your doctor says it is okay.  Avoid high-impact activities, including running, jumping rope, and jumping jacks.  Avoid sitting for a long time without moving. Get up and move around at least every few hours.  If physical therapy was prescribed, do exercises as told by your doctor.  Return to your normal  activities as told by your doctor. Ask your doctor what activities are safe for you. Safety  Do not use your leg to support your body weight until your doctor says that you can. Use crutches or a walker as told by your doctor. General instructions   Do not have any dental work done for at least 3 months after your surgery. When you do have dental work done, tell your dentist about your joint replacement.  Do not use any tobacco products, such as cigarettes, chewing tobacco, or e-cigarettes. If you need help quitting, ask your doctor.  Wear special socks (compression stockings) as told by your doctor.  If you have been sent home with a knee joint motion machine (continuous passive motion machine), use it as told by your doctor.  Drink enough fluid to keep your pee (urine) clear or pale yellow.  If you have been told to lose weight, follow instructions from your doctor about how to do this safely.  Keep all follow-up visits as told by your doctor. This is important. Contact a doctor if:  You have more redness, swelling, or pain around your cut from surgery or your drain.  You have more fluid or blood coming from your cut from surgery or your drain.  Your cut from surgery or your drain area feels warm to the touch.  You have pus or a bad smell coming from your cut from surgery or your drain.  You have a fever.  Your cut breaks open after your doctor removes your stitches, skin glue, or skin tape strips.  Your new joint feels loose.  You have knee pain that does not go away. Get help right away if:  You have a rash.  You have pain in your calf or thigh.  You have swelling in your calf or thigh.  You have shortness of breath.  You have trouble breathing.  You have chest pain.  Your ability to move your knee is getting worse. This information is not intended to replace advice given to you by your health care provider. Make sure you discuss any questions you have with  your health care provider. Document Released: 07/09/2011 Document Revised: 12/19/2015 Document Reviewed: 03/23/2015 Elsevier Interactive Patient Education  2017 Elsevier Inc.  

## 2016-12-06 NOTE — Progress Notes (Signed)
Physical Therapy Treatment Patient Details Name: Stanley Sullivan MRN: [id number] DOB: November 14, 1940 Today's Date: 12/06/2016    History of Present Illness Stanley Sullivan is a 76 y.o. 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 Marchelle Gearing, hypertension, grade 2 diastolic dysfunction, stage III chronic kidney, dyslipidemia, chronic hypoxemic respiratory failure on 2-3 L oxygen. Patient was admitted for R TKA, postop developed decreased responsiveness due to medications, also with low BP's.      PT Comments    Pt limited in mobility today secondary to pain and dyspnea. Pts SpO2 81% with ambulation returning to 94% after ~2 min standing rest breaks. Frequent cues for pursed lipped breathing. Patient would benefit from continued skilled PT to increase functional independence and increase activity tolerance. Will continue to follow acutely.    Follow Up Recommendations  DC plan and follow up therapy as arranged by surgeon;SNF;Supervision/Assistance - 24 hour     Equipment Recommendations  Rolling walker with 5" wheels    Recommendations for Other Services       Precautions / Restrictions Precautions Precautions: Fall Precaution Comments: O2 dependent Restrictions Weight Bearing Restrictions: Yes RLE Weight Bearing: Weight bearing as tolerated RLE Partial Weight Bearing Percentage or Pounds: 50%    Mobility  Bed Mobility Overal bed mobility: Needs Assistance Bed Mobility: Sit to Supine       Sit to supine: Min assist   General bed mobility comments: Min A to progress LE onto bed. Cueing for technique  Transfers Overall transfer level: Needs assistance Equipment used: Rolling walker (2 wheeled) Transfers: Sit to/from Stand Sit to Stand: Min assist         General transfer comment: Cuse for hand placement and WB on LE when sitting.   Ambulation/Gait Ambulation/Gait assistance: Min guard Ambulation Distance (Feet): 60 Feet Assistive device:  Rolling walker (2 wheeled) Gait Pattern/deviations: Step-to pattern;Decreased stride length;Decreased stance time - right Gait velocity: Decreased Gait velocity interpretation: Below normal speed for age/gender General Gait Details: Pt limited in ambulation during todays session secondary to pain and dyspnea. SpO2 sats dropped to 81% requiring standing rest break of ~2 min before returning to therapeutic levels. pt on 4L of O2 throughout session   Stairs            Wheelchair Mobility    Modified Rankin (Stroke Patients Only)       Balance Overall balance assessment: Needs assistance Sitting-balance support: No upper extremity supported;Feet supported Sitting balance-Leahy Scale: Good Sitting balance - Comments: able to lean forward to adjust socks without LOB   Standing balance support: Bilateral upper extremity supported;During functional activity Standing balance-Leahy Scale: Poor Standing balance comment: Reliant on UE support for balance                            Cognition Arousal/Alertness: Awake/alert Behavior During Therapy: WFL for tasks assessed/performed Overall Cognitive Status: Within Functional Limits for tasks assessed                                        Exercises Total Joint Exercises Ankle Circles/Pumps: AROM;Both;Seated;10 reps Quad Sets: Right;Seated;10 reps;Strengthening Towel Squeeze: AAROM;Right;10 reps;Supine Short Arc Quad: Strengthening;10 reps;Seated;Right Heel Slides: AAROM;Right;10 reps;Seated Hip ABduction/ADduction: AROM;Right;10 reps;Seated Straight Leg Raises: AROM;Right;10 reps;Seated    General Comments        Pertinent Vitals/Pain Pain Assessment: 0-10  Pain Score: 8  Pain Location: R knee Pain Descriptors / Indicators: Aching;Sore Pain Intervention(s): Monitored during session;Limited activity within patient's tolerance;Premedicated before session    Home Living                       Prior Function            PT Goals (current goals can now be found in the care plan section) Acute Rehab PT Goals Patient Stated Goal: To go home PT Goal Formulation: With patient/family Time For Goal Achievement: 12/12/16 Potential to Achieve Goals: Good Progress towards PT goals: Progressing toward goals    Frequency    7X/week      PT Plan Current plan remains appropriate    Co-evaluation              AM-PAC PT "6 Clicks" Daily Activity  Outcome Measure  Difficulty turning over in bed (including adjusting bedclothes, sheets and blankets)?: A Little Difficulty moving from lying on back to sitting on the side of the bed? : Total Difficulty sitting down on and standing up from a chair with arms (e.g., wheelchair, bedside commode, etc,.)?: Total Help needed moving to and from a bed to chair (including a wheelchair)?: A Little Help needed walking in hospital room?: A Little Help needed climbing 3-5 steps with a railing? : A Little 6 Click Score: 14    End of Session Equipment Utilized During Treatment: Gait belt;Oxygen Activity Tolerance: Patient limited by pain;Treatment limited secondary to medical complications (Comment) (desating) Patient left: in bed;with family/visitor present;with call bell/phone within reach   PT Visit Diagnosis: Other abnormalities of gait and mobility (R26.89);Difficulty in walking, not elsewhere classified (R26.2);Pain Pain - Right/Left: Right Pain - part of body: Knee     Time: [id number] PT Time Calculation (min) (ACUTE ONLY): 54 min  Charges:  $Gait Training: 38-52 mins $Therapeutic Exercise: 8-22 mins                    G Codes:       Kallie LocksHannah Anniece Bleiler, [place] Pager [phone] Acute Rehab   Sheral ApleyHannah E Floraine Buechler 12/06/2016, 11:58 AM

## 2016-12-07 ENCOUNTER — Non-Acute Institutional Stay (SKILLED_NURSING_FACILITY): Payer: Medicare Other | Admitting: Internal Medicine

## 2016-12-07 ENCOUNTER — Encounter: Payer: Self-pay | Admitting: Internal Medicine

## 2016-12-07 DIAGNOSIS — E538 Deficiency of other specified B group vitamins: Secondary | ICD-10-CM

## 2016-12-07 DIAGNOSIS — E875 Hyperkalemia: Secondary | ICD-10-CM | POA: Diagnosis not present

## 2016-12-07 DIAGNOSIS — E785 Hyperlipidemia, unspecified: Secondary | ICD-10-CM

## 2016-12-07 DIAGNOSIS — D696 Thrombocytopenia, unspecified: Secondary | ICD-10-CM

## 2016-12-07 DIAGNOSIS — Z96651 Presence of right artificial knee joint: Secondary | ICD-10-CM

## 2016-12-07 DIAGNOSIS — N183 Chronic kidney disease, stage 3 (moderate): Secondary | ICD-10-CM

## 2016-12-07 DIAGNOSIS — N400 Enlarged prostate without lower urinary tract symptoms: Secondary | ICD-10-CM

## 2016-12-07 DIAGNOSIS — M1711 Unilateral primary osteoarthritis, right knee: Secondary | ICD-10-CM | POA: Diagnosis not present

## 2016-12-07 DIAGNOSIS — N179 Acute kidney failure, unspecified: Secondary | ICD-10-CM | POA: Diagnosis not present

## 2016-12-07 DIAGNOSIS — J9611 Chronic respiratory failure with hypoxia: Secondary | ICD-10-CM

## 2016-12-07 DIAGNOSIS — J849 Interstitial pulmonary disease, unspecified: Secondary | ICD-10-CM | POA: Diagnosis not present

## 2016-12-07 NOTE — Progress Notes (Signed)
: Provider:  Randon Goldsmith. Lyn Hollingshead, MD Location:  [facility] Room Number: 109 Place of Service:  SNF [phone])  PCP: Wilson Singer, MD Patient Care Team: Wilson Singer, MD as PCP - General (Internal Medicine) Kalman Shan, MD as Consulting Physician (Pulmonary Disease) Yates Decamp, MD as Consulting Physician (Cardiology) Pollyann Savoy, MD as Consulting Physician (Rheumatology) Mia Creek, MD as Consulting Physician (Ophthalmology)  Extended Emergency Contact Information Primary Emergency Contact: Esquer,Nick Address: [address]          [place], [place] [address] D[place] of [place] Home Phone: [phone] Mobile Phone: [phone] Relation: Son Secondary Emergency Contact: Knipfer,Jasuben Address: [address]          [place], [place] [address] D[place] of [place] Home Phone: [phone] Mobile Phone: [phone] Relation: Spouse     Allergies: No known allergies  Chief Complaint  Patient presents with  . New Admit To SNF    following hospitalization 12/04/16 to 12/06/16 right total knee arthroplasty Dr. Cleophas Dunker.    HPI: Patient is 76 y.o. male with chronic diastolic heart failure, hypertension, interstitial lung disease, chronic kidney disease stage III, diabetes mellitus type 2 who was admitted to [facility] from 8/7-9 for a planned total knee replacement for right knee osteoarthritis. Hospital course was complicated by postop hypoxia and nonresponsiveness requiring Narcan. Also by hyperkalemia and acute on chronic kidney disease stage III that was treated and resolved with IV fluids and the D/C of patient's Lasix and Cozaar. Patient is admitted to skilled nursing facility for OT/PT. While at skilled nursing facility patient will be followed for hyperlipidemia treated with Lipitor, hypertension treated with metoprolol, losartan, and Lasix, and LUTS treated with Flomax.  Past Medical History:  Diagnosis Date    . Allergy, unspecified not elsewhere classified   . Benign prostatic hypertrophy 08/24/2013  . Chronic kidney disease, stage III (moderate)    patient denies  . Chronic respiratory failure with hypoxia (HCC) 12/05/2016  . Dyspnea on exertion 04/10/2013  . External hemorrhoids without mention of complication   . Gout, unspecified   . Hypertension   . Hypertrophy of prostate without urinary obstruction and other lower urinary tract symptoms (LUTS)   . Interstitial lung disease (HCC) 10/29/2014  . Maculopapular rash, generalized 10/02/2012  . Obesity, unspecified   . On home oxygen therapy    in the morning and evening, with exertion, as needed when O2 drops to 80s  . Organic sleep apnea, unspecified    CPAP, presure setting 3  . OSA (obstructive sleep apnea) 10/09/2013  . Osteoarthrosis, unspecified whether generalized or localized, unspecified site   . Other abnormal glucose   . Other and unspecified hyperlipidemia   . Other atopic dermatitis and related conditions   . Other specified visual disturbances   . Pain in joint, lower leg   . Pain in limb   . Primary osteoarthritis of right knee 10/29/2014  . 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 Type II or unspecified type diabetes mellitus without mention of complication, not stated as uncontrolled 08/24/2013  . Unspecified disorder of kidney and ureter   . Unspecified essential hypertension   . Urticaria, unspecified     Past Surgical History:  Procedure Laterality Date  . CATARACT EXTRACTION    . JOINT REPLACEMENT    . TOTAL KNEE ARTHROPLASTY Right 12/04/2016   Procedure: RIGHT TOTAL KNEE ARTHROPLASTY;  Surgeon: Valeria Batman, MD;  Location: [facility] OR;  Service: Orthopedics;  Laterality: Right;    Allergies as of 12/07/2016      Reactions   No Known Allergies       Medication List       Accurate as of 12/07/16  9:41 AM. Always use your most recent med list.          albuterol 108 (90 Base) MCG/ACT  inhaler Commonly known as:  PROVENTIL HFA;VENTOLIN HFA Inhale 2 puffs into the lungs. Into lungs every 6 hours as needed for wheezing or shortness of breath or cough   allopurinol 100 MG tablet Commonly known as:  ZYLOPRIM Take 1 tablet (100 mg total) by mouth daily. For gout   aspirin 325 MG tablet Take 1 tablet (325 mg total) by mouth 2 (two) times daily after a meal.   atorvastatin 80 MG tablet Commonly known as:  LIPITOR Take 80 mg by mouth daily.   CALCIUM PO Take 500 mg by mouth daily.   cholecalciferol 1000 units tablet Commonly known as:  VITAMIN D Take 1,000 Units by mouth daily.   furosemide 20 MG tablet Commonly known as:  LASIX Take 1 tablet (20 mg total) by mouth daily. For swelling of legs   HYDROcodone-acetaminophen 5-325 MG tablet Commonly known as:  NORCO/VICODIN Take 1 tablet by mouth every 6 (six) hours as needed for moderate pain.   KLS ALLER-TEC 10 MG tablet Generic drug:  cetirizine Take 10 mg by mouth daily as needed (for allergies.).   losartan 100 MG tablet Commonly known as:  COZAAR Take 100 mg by mouth daily.   LUBRICANT DROPS 1.4 % ophthalmic solution Generic drug:  polyvinyl alcohol Place 1 drop into both eyes 3 (three) times daily as needed for dry eyes.   metoprolol succinate 100 MG 24 hr tablet Commonly known as:  TOPROL-XL TAKE 1 TABLET (100 MG TOTAL) BY MOUTH DAILY.   tamsulosin 0.4 MG Caps capsule Commonly known as:  FLOMAX TAKE 1 CAPSULE BY MOUTH ONCE A DAY FOR YOUR PROSTATE   Vitamin B-12 2500 MCG Subl Place 2,500 mcg under the tongue daily.       Meds ordered this encounter  Medications  . albuterol (PROVENTIL HFA;VENTOLIN HFA) 108 (90 Base) MCG/ACT inhaler    Sig: Inhale 2 puffs into the lungs. Into lungs every 6 hours as needed for wheezing or shortness of breath or cough    Immunization History  Administered Date(s) Administered  . Influenza Split 12/13/2014  . Influenza Whole 02/14/2007, 01/17/2013  .  Influenza,inj,Quad PF,36+ Mos 01/29/2014, 12/26/2015  . Pneumococcal Conjugate-13 02/25/2014  . Pneumococcal Polysaccharide-23 02/14/2007  . Td 04/30/2008  . Zoster 09/10/2013    Social History  Substance Use Topics  . Smoking status: Former Smoker    Packs/day: 0.50    Years: 15.00    Types: Cigarettes    Quit date: 04/30/1993  . Smokeless tobacco: Former Neurosurgeon    Types: Chew  . Alcohol use Yes     Comment: occassional    Family history is + problems with anesthesia  History reviewed. No pertinent family history.    Review of Systems  DATA OBTAINED: from patient,  family member GENERAL:  no fevers, fatigue, appetite changes SKIN: No itching, or rash EYES: No eye pain, redness, discharge EARS: No earache, tinnitus, change in hearing NOSE: No congestion, drainage or bleeding  MOUTH/THROAT: No mouth or tooth pain, No sore throat RESPIRATORY: No cough, wheezing, SOB CARDIAC: No chest pain, palpitations, lower extremity edema  GI: No abdominal pain,  No N/V/D or constipation, No heartburn or reflux  GU: No dysuria, frequency or urgency, or incontinence  MUSCULOSKELETAL: +unrelieved bone/joint pain NEUROLOGIC: No headache, dizziness or focal weakness PSYCHIATRIC: No c/o anxiety or sadness   Vitals:   12/07/16 0900  BP: (!) 187/71  Pulse: 87  Resp: 20  Temp: (!) 97.5 F (36.4 C)  SpO2: 98%    SpO2 Readings from Last 1 Encounters:  12/07/16 98%   Body mass index is 29.09 kg/m.     Physical Exam  GENERAL APPEARANCE: Alert, conversant, Looks uncomfortable.  SKIN: No diaphoresis rash; incision area without unexpected swelling redness or heat HEAD: Normocephalic, atraumatic  EYES: Conjunctiva/lids clear. Pupils round, reactive. EOMs intact.  EARS: External exam WNL, canals clear. Hearing grossly normal.  NOSE: No deformity or discharge.  MOUTH/THROAT: Lips w/o lesions  RESPIRATORY: Breathing is even, unlabored. Lung sounds are clear   CARDIOVASCULAR: Heart RRR  no murmurs, rubs or gallops. No peripheral edema.   GASTROINTESTINAL: Abdomen is soft, non-tender, not distended w/ normal bowel sounds. GENITOURINARY: Bladder non tender, not distended  MUSCULOSKELETAL: No abnormal joints or musculature NEUROLOGIC:  Cranial nerves 2-12 grossly intact. Moves all extremities  PSYCHIATRIC: Mood and affect appropriate to situation, no behavioral issues  Patient Active Problem List   Diagnosis Date Noted  . Hypertension 12/05/2016  . Acute renal failure superimposed on stage 3 chronic kidney disease (HCC) 12/05/2016  . Chronic respiratory failure with hypoxia (HCC) 12/05/2016  . Chronic diastolic heart failure, NYHA class 2 (HCC) 12/05/2016  . Acute hyperkalemia 12/05/2016  . Hypocalcemia 12/05/2016  . AKI (acute kidney injury) (HCC)   . 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 Interstitial lung disease (HCC) 10/29/2014  . Primary osteoarthritis of right knee 10/29/2014  . DM (diabetes mellitus) type II controlled with renal manifestation (HCC) 10/29/2014  . OSA (obstructive sleep apnea) 10/09/2013  . ILD (interstitial lung disease) (HCC) 10/09/2013  . Cough 10/07/2013  . Dyspnea 10/07/2013  . Osteoarthritis of left knee 08/24/2013  . Benign prostatic hypertrophy 08/24/2013  . Essential hypertension, benign 08/24/2013  . Gout 08/24/2013  . Type II or unspecified type diabetes mellitus without mention of complication, not stated as uncontrolled 08/24/2013  . Chronic idiopathic constipation 04/10/2013  . Dyspnea on exertion 04/10/2013  . Osteoarthrosis, unspecified whether generalized or localized, unspecified site   . Chronic kidney disease, stage III (moderate)   . Obesity, unspecified   . Gout, unspecified   . Other and unspecified hyperlipidemia   . Essential hypertension   . Hypertrophy of prostate without urinary obstruction and other lower urinary tract symptoms (LUTS)   . Maculopapular rash, generalized 10/02/2012       Labs reviewed: Basic Metabolic Panel:    Component Value Date/Time   NA 137 12/06/2016 0550   NA 137 07/20/2015 0947   K 5.0 12/06/2016 0550   CL 111 12/06/2016 0550   CO2 18 (L) 12/06/2016 0550   GLUCOSE 128 (H) 12/06/2016 0550   BUN 24 (H) 12/06/2016 0550   BUN 16 07/20/2015 0947   CREATININE 1.94 (H) 12/06/2016 0550   CREATININE 1.55 (H) 12/27/2015 0959   CALCIUM 8.0 (L) 12/06/2016 0550   PROT 6.3 (L) 11/26/2016 0853   PROT 6.3 08/09/2014 1139   ALBUMIN 3.6 11/26/2016 0853   ALBUMIN 3.8 08/09/2014 1139   AST 24 11/26/2016 0853   ALT 16 (L) 11/26/2016 0853   ALKPHOS 61 11/26/2016 0853   BILITOT 1.3 (H) 11/26/2016 0853   BILITOT 0.7 08/09/2014  1139   GFRNONAA 32 (L) 12/06/2016 0550   GFRAA 37 (L) 12/06/2016 0550     Recent Labs  12/05/16 0436 12/05/16 0721 12/06/16 0550  NA 136 137 137  K 6.3* 4.0 5.0  CL 110 115* 111  CO2 19* 17* 18*  GLUCOSE 104* 119* 128*  BUN 23* 20 24*  CREATININE 2.14* 1.81* 1.94*  CALCIUM 7.9* 6.3* 8.0*   Liver Function Tests:  Recent Labs  12/27/15 0959 11/26/16 0853  AST 16 24  ALT 10 16*  ALKPHOS 59 61  BILITOT 1.0 1.3*  PROT 5.9* 6.3*  ALBUMIN 3.5* 3.6   No results for input(s): LIPASE, AMYLASE in the last 8760 hours. No results for input(s): AMMONIA in the last 8760 hours. CBC:  Recent Labs  12/27/15 0959 11/26/16 0853 12/04/16 1249 12/05/16 0436 12/06/16 0550  WBC 7.8 8.7 10.1 13.0* 16.8*  NEUTROABS 4,914 5.4  --   --   --   HGB 14.0 15.2 13.4 13.3 12.8*  HCT 44.6 46.2 43.0 42.0 39.4  MCV 74.7* 72.3* 73.4* 75.4* 74.3*  PLT 208 167 148* 137* 132*   Lipid No results for input(s): CHOL, HDL, LDLCALC, TRIG in the last 8760 hours.  Cardiac Enzymes: No results for input(s): CKTOTAL, CKMB, CKMBINDEX, TROPONINI in the last 8760 hours. BNP: No results for input(s): BNP in the last 8760 hours. No results found for: [facility] Lab Results  Component Value Date   HGBA1C 6.4 (H) 12/27/2015   Lab Results   Component Value Date   TSH 2.180 08/09/2014   Lab Results  Component Value Date   VITAMINB12 302 07/20/2015   Lab Results  Component Value Date   FOLATE 5.4 07/20/2015   No results found for: IRON, TIBC, FERRITIN  Imaging and Procedures obtained prior to SNF admission: No results found.   Not all labs, radiology exams or other studies done during hospitalization come through on my EPIC note; however they are reviewed by me.    Assessment and Plan  RIGHT KNEE OSTEO ARTHRITIS/status post RIGHT KNEE REPLACEMENT-surgery SL without problems; there were some postoperative problems with hypoxia as related below SNF -patient is admitted to skilled nursing for OT/PT; patient has been in a great deal of pain per family and he certainly looks uncomfortable; therefore we will schedule Norco 5 mg every 6 hours while monitoring his mental status as he had an episode of unresponsiveness in the hospital with an IV pain medication and will also add Robaxin 500 mg by mouth 3 times a day scheduled; this was discussed with wife with her understanding and is sent; patient is being prophylaxed with ASA 325 twice a day, presumed for 4 weeks until told otherwise as it was not specified  HYPOXIA POST OP/INTERSTITIAL LUNG DISEASE-patient required Narcan for hypoxia and unresponsiveness; was able subsequently to be weaned off of O2  ACUTE HYPERKALEMIA - postop was found to have a potassium of 6.3 which was treated and resolved SNF -follow-up BMP  ACUTE ON CK D3-1 year ago patient's creatinine is 1.55, rose to 1.9 while hospitalized; patient was treated with IV fluids; DC creatinine is 1.9 SNF - will follow-up BMP; 1.8-1.9 may be new baseline  THROMBOCYTOPENIA-platelets on discharge are 132 SNF - will follow-up CBC  HYPERTENSION SNF - now well controlled but patient is in pain; for now will continue losartan 100 mg by mouth daily, Lasix 20 mg by mouth daily and Toprol-XL 100 mg by mouth daily; blood  pressure will be monitored every shift  HYPERLIPIDEMIA SNF -not stated as uncontrolled plan to continue Lipitor 80 mg by mouth daily  BPH WITH LUTS SNF -plan to continue Flomax 0.4 mg by mouth daily  VITAMIN B-12 DEFICIENCY SNF -plan to continue 2500 g sublingual daily    Time spent greater than 45 minutes;> 50% of time with patient was spent reviewing records, labs, tests and studies, counseling and developing plan of care  Thurston Hole D. Lyn Hollingshead, MD

## 2016-12-08 ENCOUNTER — Encounter: Payer: Self-pay | Admitting: Internal Medicine

## 2016-12-08 DIAGNOSIS — D696 Thrombocytopenia, unspecified: Secondary | ICD-10-CM | POA: Insufficient documentation

## 2016-12-08 DIAGNOSIS — E538 Deficiency of other specified B group vitamins: Secondary | ICD-10-CM | POA: Insufficient documentation

## 2016-12-09 MED FILL — Medication: Qty: 1 | Status: AC

## 2016-12-11 ENCOUNTER — Non-Acute Institutional Stay (SKILLED_NURSING_FACILITY): Payer: Medicare Other | Admitting: Internal Medicine

## 2016-12-11 ENCOUNTER — Encounter: Payer: Self-pay | Admitting: Internal Medicine

## 2016-12-11 DIAGNOSIS — D72825 Bandemia: Secondary | ICD-10-CM | POA: Diagnosis not present

## 2016-12-11 DIAGNOSIS — L539 Erythematous condition, unspecified: Secondary | ICD-10-CM

## 2016-12-11 LAB — BASIC METABOLIC PANEL
BUN: 21 (ref 4–21)
CREATININE: 1.4 — AB (ref 0.6–1.3)
GLUCOSE: 110
Potassium: 4.6 (ref 3.4–5.3)
Sodium: 138 (ref 137–147)

## 2016-12-11 LAB — CBC AND DIFFERENTIAL
HEMATOCRIT: 41 (ref 41–53)
Hemoglobin: 12.7 — AB (ref 13.5–17.5)
PLATELETS: 217 (ref 150–399)
WBC: 14.6

## 2016-12-11 NOTE — Progress Notes (Signed)
Location:  [facility] Room Number: 109 Place of Service:  SNF [phone])  Provider: Randon Goldsmith. Lyn Hollingshead, MD  Wilson Singer, MD  Patient Care Team: Wilson Singer, MD as PCP - General (Internal Medicine) Kalman Shan, MD as Consulting Physician (Pulmonary Disease) Yates Decamp, MD as Consulting Physician (Cardiology) Pollyann Savoy, MD as Consulting Physician (Rheumatology) Mia Creek, MD as Consulting Physician (Ophthalmology)  Extended Emergency Contact Information Primary Emergency Contact: Gorin,Nick Address: [address]          [place], [place] [address] D[place] of [place] Home Phone: [phone] Mobile Phone: [phone] Relation: Son Secondary Emergency Contact: Mosco,Jasuben Address: [address]          [place], [place] [address] Darden [place] of [place] Home Phone: [phone] Mobile Phone: [phone] Relation: Spouse    Allergies: No known allergies  Chief Complaint  Patient presents with  . Acute Visit    HPI: Patient is 76 y.o. male who Is status post right knee replacement and who  is being seen today to reassess his me one more time. Patient has had continued elevated white count although it is lower today than it was yesterday. Per physical therapy and patient the knee is less painful and patient is able to progress in his therapy. Patient has not had any fever nausea vomiting muscle aches or any other systemic symptom.  Past Medical History:  Diagnosis Date  . Allergy, unspecified not elsewhere classified   . Benign prostatic hypertrophy 08/24/2013  . Chronic kidney disease, stage III (moderate)    patient denies  . Chronic respiratory failure with hypoxia (HCC) 12/05/2016  . Dyspnea on exertion 04/10/2013  . External hemorrhoids without mention of complication   . Gout, unspecified   . Hypertension   . Hypertrophy of prostate without urinary obstruction and other lower urinary tract symptoms  (LUTS)   . Interstitial lung disease (HCC) 10/29/2014  . Maculopapular rash, generalized 10/02/2012  . Obesity, unspecified   . On home oxygen therapy    in the morning and evening, with exertion, as needed when O2 drops to 80s  . Organic sleep apnea, unspecified    CPAP, presure setting 3  . OSA (obstructive sleep apnea) 10/09/2013  . Osteoarthrosis, unspecified whether generalized or localized, unspecified site   . Other abnormal glucose   . Other and unspecified hyperlipidemia   . Other atopic dermatitis and related conditions   . Other specified visual disturbances   . Pain in joint, lower leg   . Pain in limb   . Primary osteoarthritis of right knee 10/29/2014  . 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 Type II or unspecified type diabetes mellitus without mention of complication, not stated as uncontrolled 08/24/2013  . Unspecified disorder of kidney and ureter   . Unspecified essential hypertension   . Urticaria, unspecified     Past Surgical History:  Procedure Laterality Date  . CATARACT EXTRACTION    . JOINT REPLACEMENT    . TOTAL KNEE ARTHROPLASTY Right 12/04/2016   Procedure: RIGHT TOTAL KNEE ARTHROPLASTY;  Surgeon: Valeria Batman, MD;  Location: [facility] OR;  Service: Orthopedics;  Laterality: Right;    Allergies as of 12/11/2016      Reactions   No Known Allergies       Medication List       Accurate as of 12/11/16  3:06 PM. Always use your most recent med list.  albuterol 108 (90 Base) MCG/ACT inhaler Commonly known as:  PROVENTIL HFA;VENTOLIN HFA Inhale 2 puffs into the lungs. Into lungs every 6 hours as needed for wheezing or shortness of breath or cough   allopurinol 100 MG tablet Commonly known as:  ZYLOPRIM Take 1 tablet (100 mg total) by mouth daily. For gout   aspirin 325 MG tablet Take 1 tablet (325 mg total) by mouth 2 (two) times daily after a meal.   atorvastatin 80 MG tablet Commonly known as:  LIPITOR Take 80 mg by mouth  daily.   CALCIUM PO Take 500 mg by mouth daily.   cholecalciferol 1000 units tablet Commonly known as:  VITAMIN D Take 1,000 Units by mouth daily.   furosemide 20 MG tablet Commonly known as:  LASIX Take 1 tablet (20 mg total) by mouth daily. For swelling of legs   HYDROcodone-acetaminophen 5-325 MG tablet Commonly known as:  NORCO/VICODIN Take 1 tablet by mouth every 6 (six) hours as needed for moderate pain.   KLS ALLER-TEC 10 MG tablet Generic drug:  cetirizine Take 10 mg by mouth daily as needed (for allergies.).   losartan 100 MG tablet Commonly known as:  COZAAR Take 100 mg by mouth daily.   LUBRICANT DROPS 1.4 % ophthalmic solution Generic drug:  polyvinyl alcohol Place 1 drop into both eyes 3 (three) times daily as needed for dry eyes.   metoprolol succinate 100 MG 24 hr tablet Commonly known as:  TOPROL-XL TAKE 1 TABLET (100 MG TOTAL) BY MOUTH DAILY.   tamsulosin 0.4 MG Caps capsule Commonly known as:  FLOMAX TAKE 1 CAPSULE BY MOUTH ONCE A DAY FOR YOUR PROSTATE   Vitamin B-12 2500 MCG Subl Place 2,500 mcg under the tongue daily.       No orders of the defined types were placed in this encounter.   Immunization History  Administered Date(s) Administered  . Influenza Split 12/13/2014  . Influenza Whole 02/14/2007, 01/17/2013  . Influenza,inj,Quad PF,36+ Mos 01/29/2014, 12/26/2015  . Pneumococcal Conjugate-13 02/25/2014  . Pneumococcal Polysaccharide-23 02/14/2007  . Td 04/30/2008  . Zoster 09/10/2013    Social History  Substance Use Topics  . Smoking status: Former Smoker    Packs/day: 0.50    Years: 15.00    Types: Cigarettes    Quit date: 04/30/1993  . Smokeless tobacco: Former NeurosurgeonUser    Types: Chew  . Alcohol use Yes     Comment: occassional    Review of Systems  DATA OBTAINED: from patient, nurse,PT, family member GENERAL:  no fevers, fatigue, appetite changes SKIN: Patient feels it looks better HEENT: No complaint RESPIRATORY: No  cough, wheezing, SOB CARDIAC: No chest pain, no lower extremity edema  GI: No abdominal pain, No N/V/D or constipation, No heartburn or reflux  GU: No dysuria, frequency or urgency, or incontinence  MUSCULOSKELETAL: No unrelieved bone/joint pain NEUROLOGIC: No headache, dizziness  PSYCHIATRIC: No overt anxiety or sadness  Vitals:   12/11/16 1459  BP: (!) 158/81  Pulse: 81  Resp: 20  Temp: 98.2 F (36.8 C)   Body mass index is 28.68 kg/m. Physical Exam  GENERAL APPEARANCE: Alert, conversant, No acute distress  SKIN: Right knee incision area-postop tape is still in place however there is no redness and no heat to area and swelling has improved; per PT movement has improved HEENT: Unremarkable RESPIRATORY: Breathing is even, unlabored. Lung sounds are clear   CARDIOVASCULAR: Heart RRR no murmurs, rubs or gallops. No peripheral edema  GASTROINTESTINAL: Abdomen is soft, non-tender,  not distended w/ normal bowel sounds.  GENITOURINARY: Bladder non tender, not distended  MUSCULOSKELETAL: No abnormal joints or musculature NEUROLOGIC: Cranial nerves 2-12 grossly intact. Moves all extremities PSYCHIATRIC: Mood and affect appropriate to situation, no behavioral issues  Patient Active Problem List   Diagnosis Date Noted  . Thrombocytopenia (HCC) 12/08/2016  . B12 deficiency 12/08/2016  . Hypertension 12/05/2016  . Acute renal failure superimposed on stage 3 chronic kidney disease (HCC) 12/05/2016  . Chronic respiratory failure with hypoxia (HCC) 12/05/2016  . Chronic diastolic heart failure, NYHA class 2 (HCC) 12/05/2016  . Acute hyperkalemia 12/05/2016  . Hypocalcemia 12/05/2016  . AKI (acute kidney injury) (HCC)   . 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 Interstitial lung disease (HCC) 10/29/2014  . Primary osteoarthritis of right knee 10/29/2014  . DM (diabetes mellitus) type II controlled with renal manifestation (HCC) 10/29/2014  . OSA (obstructive sleep  apnea) 10/09/2013  . ILD (interstitial lung disease) (HCC) 10/09/2013  . Cough 10/07/2013  . Dyspnea 10/07/2013  . Osteoarthritis of left knee 08/24/2013  . Benign prostatic hypertrophy 08/24/2013  . Essential hypertension, benign 08/24/2013  . Gout 08/24/2013  . Type II or unspecified type diabetes mellitus without mention of complication, not stated as uncontrolled 08/24/2013  . Chronic idiopathic constipation 04/10/2013  . Dyspnea on exertion 04/10/2013  . Osteoarthrosis, unspecified whether generalized or localized, unspecified site   . Chronic kidney disease, stage III (moderate)   . Obesity, unspecified   . Gout, unspecified   . Hyperlipidemia   . Essential hypertension   . BPH without obstruction/lower urinary tract symptoms   . Maculopapular rash, generalized 10/02/2012    CMP     Component Value Date/Time   NA 137 12/06/2016 0550   NA 137 07/20/2015 0947   K 5.0 12/06/2016 0550   CL 111 12/06/2016 0550   CO2 18 (L) 12/06/2016 0550   GLUCOSE 128 (H) 12/06/2016 0550   BUN 24 (H) 12/06/2016 0550   BUN 16 07/20/2015 0947   CREATININE 1.94 (H) 12/06/2016 0550   CREATININE 1.55 (H) 12/27/2015 0959   CALCIUM 8.0 (L) 12/06/2016 0550   PROT 6.3 (L) 11/26/2016 0853   PROT 6.3 08/09/2014 1139   ALBUMIN 3.6 11/26/2016 0853   ALBUMIN 3.8 08/09/2014 1139   AST 24 11/26/2016 0853   ALT 16 (L) 11/26/2016 0853   ALKPHOS 61 11/26/2016 0853   BILITOT 1.3 (H) 11/26/2016 0853   BILITOT 0.7 08/09/2014 1139   GFRNONAA 32 (L) 12/06/2016 0550   GFRAA 37 (L) 12/06/2016 0550    Recent Labs  12/05/16 0436 12/05/16 0721 12/06/16 0550  NA 136 137 137  K 6.3* 4.0 5.0  CL 110 115* 111  CO2 19* 17* 18*  GLUCOSE 104* 119* 128*  BUN 23* 20 24*  CREATININE 2.14* 1.81* 1.94*  CALCIUM 7.9* 6.3* 8.0*    Recent Labs  12/27/15 0959 11/26/16 0853  AST 16 24  ALT 10 16*  ALKPHOS 59 61  BILITOT 1.0 1.3*  PROT 5.9* 6.3*  ALBUMIN 3.5* 3.6    Recent Labs  12/27/15 0959  11/26/16 0853 12/04/16 1249 12/05/16 0436 12/06/16 0550  WBC 7.8 8.7 10.1 13.0* 16.8*  NEUTROABS 4,914 5.4  --   --   --   HGB 14.0 15.2 13.4 13.3 12.8*  HCT 44.6 46.2 43.0 42.0 39.4  MCV 74.7* 72.3* 73.4* 75.4* 74.3*  PLT 208 167 148* 137* 132*   No results for input(s): CHOL, LDLCALC, TRIG in the last  8760 hours.  Invalid input(s): HCL No results found for: [facility] Lab Results  Component Value Date   TSH 2.180 08/09/2014   Lab Results  Component Value Date   HGBA1C 6.4 (H) 12/27/2015   Lab Results  Component Value Date   CHOL 167 07/20/2015   HDL 37 (L) 07/20/2015   LDLCALC 111 (H) 07/20/2015   TRIG 94 07/20/2015   CHOLHDL 4.5 07/20/2015    Significant Diagnostic Results in last 30 days:  Dg Chest Port 1 View  Result Date: 12/05/2016 CLINICAL DATA:  Cough. One day postop from right knee arthroplasty. Chronic kidney disease stage 3. EXAM: PORTABLE CHEST 1 VIEW COMPARISON:  03/11/2015 FINDINGS: The heart size and mediastinal contours are within normal limits. Low lung volumes are noted, however both lungs are clear. No evidence of pneumothorax or pleural effusion. The visualized skeletal structures are unremarkable. IMPRESSION: Low lung volumes.  No active disease. Electronically Signed   By: Myles Rosenthal M.D.   On: 12/05/2016 11:35    Assessment and Plan  LEUKOCYTOSIS/skin erythema-several days prior to this patient has had some mild redness heat and swelling of his right knee which is normal status post surgery, there was on the edge of what I thought was acceptable for post surgery and has had continued elevation of his white count; today patient's right knee looks much better with normal color heat and swelling and patient's white count is decreased today to 11.3 from 14.4 several days ago; looks very promising; will continue to monitor  No problem-specific Assessment & Plan notes found for this encounter.   Labs/tests ordered:    Randon Goldsmith. Lyn Hollingshead, MD

## 2016-12-12 ENCOUNTER — Other Ambulatory Visit: Payer: Self-pay

## 2016-12-12 LAB — CBC AND DIFFERENTIAL
HEMATOCRIT: 36 — AB (ref 41–53)
Hemoglobin: 11.3 — AB (ref 13.5–17.5)
PLATELETS: 216 (ref 150–399)
WBC: 13.7

## 2016-12-14 ENCOUNTER — Other Ambulatory Visit: Payer: Self-pay | Admitting: *Deleted

## 2016-12-14 NOTE — Patient Outreach (Signed)
Outreach call to facility to speak with Chelsea, SW re: patient progress and discharge planning needs.  RNCM was told that SW no longer working at facility, left VM for Nikki who is covering for SW requesting call back regarding update on patient.   Plan to follow up with facility Rueben Kassim E. Girard Koontz, RN, BSN, CCM  Post Acute Care Coordinator [facility] ([phone]) Business Cell  ([phone]) Toll Free Office 

## 2016-12-15 ENCOUNTER — Encounter: Payer: Self-pay | Admitting: Internal Medicine

## 2016-12-19 ENCOUNTER — Ambulatory Visit (INDEPENDENT_AMBULATORY_CARE_PROVIDER_SITE_OTHER): Payer: Medicare Other | Admitting: Orthopedic Surgery

## 2016-12-19 ENCOUNTER — Ambulatory Visit (INDEPENDENT_AMBULATORY_CARE_PROVIDER_SITE_OTHER): Payer: Medicare Other

## 2016-12-19 ENCOUNTER — Telehealth (INDEPENDENT_AMBULATORY_CARE_PROVIDER_SITE_OTHER): Payer: Self-pay | Admitting: Orthopaedic Surgery

## 2016-12-19 ENCOUNTER — Inpatient Hospital Stay (INDEPENDENT_AMBULATORY_CARE_PROVIDER_SITE_OTHER): Payer: Medicare Other | Admitting: Orthopedic Surgery

## 2016-12-19 ENCOUNTER — Encounter (INDEPENDENT_AMBULATORY_CARE_PROVIDER_SITE_OTHER): Payer: Self-pay | Admitting: Orthopedic Surgery

## 2016-12-19 VITALS — BP 164/90 | HR 79 | Ht 67.0 in | Wt 165.0 lb

## 2016-12-19 DIAGNOSIS — Z96651 Presence of right artificial knee joint: Secondary | ICD-10-CM | POA: Diagnosis not present

## 2016-12-19 NOTE — Telephone Encounter (Signed)
[facility] calling for [employer] bearing status on patient. Please call to advise.

## 2016-12-19 NOTE — Progress Notes (Signed)
Office Visit Note   Patient: Stanley Sullivan           Date of Birth: 29-Feb-1941           MRN: [id number] Visit Date: 12/19/2016              Requested by: Wilson Singer, MD No address on file PCP: Wilson Singer, MD   Assessment & Plan: Visit Diagnoses:  1. Total knee replacement status, right     Plan:  #1: Continue physical therapy as per protocol. Work on extension #2: Discontinue the routine use of hydrocodone every 6 hours during the day #3: May use Tylenol during the day as requested by the patient   Follow-Up Instructions: Return in about 2 weeks (around 01/02/2017).   Orders:  Orders Placed This Encounter  Procedures  . XR KNEE 3 VIEW RIGHT   No orders of the defined types were placed in this encounter.     Procedures: No procedures performed   Clinical Data: No additional findings.   Subjective: Chief Complaint  Patient presents with  . Right Knee - Routine Post Op    Mr. Harsha is a 37 y o that is status post 2 weeks Right total replacement. Staples removed and steri strips applied.    Mr. Quintal is seen today for follow-up of his right total knee arthroplasty now 15 days postop. Apparently is doing very well. His son brings up the fact that they keep giving him hydrocodone during the day routinely but the patient would like to have that only at nighttime when he really needs it. He is very happy with his results at this time. Denies any calf pain. Denies any neurovascular compromise.    Review of Systems  Constitutional: Negative for fatigue.  HENT: Negative for hearing loss.   Respiratory: Positive for shortness of breath. Negative for apnea and chest tightness.   Cardiovascular: Negative for chest pain, palpitations and leg swelling.  Gastrointestinal: Positive for constipation. Negative for blood in stool and diarrhea.  Genitourinary: Negative for difficulty urinating.  Musculoskeletal: Negative for arthralgias, back pain, joint  swelling, myalgias, neck pain and neck stiffness.  Neurological: Negative for weakness, numbness and headaches.  Hematological: Does not bruise/bleed easily.  Psychiatric/Behavioral: Negative for sleep disturbance. The patient is not nervous/anxious.      Objective: Vital Signs: BP (!) 164/90   Pulse 79   Ht 5\' 7"  (1.702 m)   Wt 165 lb (74.8 kg)   BMI 25.84 kg/m   Physical Exam  Ortho Exam  Wound is clean and dry with no signs of infection. Staples removed and Steri-Strips are placed. Calf is supple and nontender. Range of motion 5 to 105. Good ligamentous stability. Neurovascularly intact.  Specialty Comments:  No specialty comments available.  Imaging: Xr Knee 3 View Right  Result Date: 12/19/2016 Three-view x-ray of the right knee reveals excellent position alignment of the prosthesis. No radiolucencies noted.    PMFS History: Patient Active Problem List   Diagnosis Date Noted  . Thrombocytopenia (HCC) 12/08/2016  . B12 deficiency 12/08/2016  . Hypertension 12/05/2016  . Acute renal failure superimposed on stage 3 chronic kidney disease (HCC) 12/05/2016  . Chronic respiratory failure with hypoxia (HCC) 12/05/2016  . Chronic diastolic heart failure, NYHA class 2 (HCC) 12/05/2016  . Acute hyperkalemia 12/05/2016  . Hypocalcemia 12/05/2016  . AKI (acute kidney injury) (HCC)   . 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 Interstitial lung disease (HCC)  10/29/2014  . Primary osteoarthritis of right knee 10/29/2014  . DM (diabetes mellitus) type II controlled with renal manifestation (HCC) 10/29/2014  . OSA (obstructive sleep apnea) 10/09/2013  . ILD (interstitial lung disease) (HCC) 10/09/2013  . Cough 10/07/2013  . Dyspnea 10/07/2013  . Osteoarthritis of left knee 08/24/2013  . Benign prostatic hypertrophy 08/24/2013  . Essential hypertension, benign 08/24/2013  . Gout 08/24/2013  . Type II or unspecified type diabetes mellitus without mention  of complication, not stated as uncontrolled 08/24/2013  . Chronic idiopathic constipation 04/10/2013  . Dyspnea on exertion 04/10/2013  . Osteoarthrosis, unspecified whether generalized or localized, unspecified site   . Chronic kidney disease, stage III (moderate)   . Obesity, unspecified   . Gout, unspecified   . Hyperlipidemia   . Essential hypertension   . BPH without obstruction/lower urinary tract symptoms   . Maculopapular rash, generalized 10/02/2012   Past Medical History:  Diagnosis Date  . Allergy, unspecified not elsewhere classified   . Benign prostatic hypertrophy 08/24/2013  . Chronic kidney disease, stage III (moderate)    patient denies  . Chronic respiratory failure with hypoxia (HCC) 12/05/2016  . Dyspnea on exertion 04/10/2013  . External hemorrhoids without mention of complication   . Gout, unspecified   . Hypertension   . Hypertrophy of prostate without urinary obstruction and other lower urinary tract symptoms (LUTS)   . Interstitial lung disease (HCC) 10/29/2014  . Maculopapular rash, generalized 10/02/2012  . Obesity, unspecified   . On home oxygen therapy    in the morning and evening, with exertion, as needed when O2 drops to 80s  . Organic sleep apnea, unspecified    CPAP, presure setting 3  . OSA (obstructive sleep apnea) 10/09/2013  . Osteoarthrosis, unspecified whether generalized or localized, unspecified site   . Other abnormal glucose   . Other and unspecified hyperlipidemia   . Other atopic dermatitis and related conditions   . Other specified visual disturbances   . Pain in joint, lower leg   . Pain in limb   . Primary osteoarthritis of right knee 10/29/2014  . 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 Type II or unspecified type diabetes mellitus without mention of complication, not stated as uncontrolled 08/24/2013  . Unspecified disorder of kidney and ureter   . Unspecified essential hypertension   . Urticaria, unspecified       History reviewed. No pertinent family history.  Past Surgical History:  Procedure Laterality Date  . CATARACT EXTRACTION    . JOINT REPLACEMENT    . TOTAL KNEE ARTHROPLASTY Right 12/04/2016   Procedure: RIGHT TOTAL KNEE ARTHROPLASTY;  Surgeon: Valeria Batman, MD;  Location: [facility] OR;  Service: Orthopedics;  Laterality: Right;   Social History   Occupational History  . retired Art gallery manager    Social History Main Topics  . Smoking status: Former Smoker    Packs/day: 0.50    Years: 15.00    Types: Cigarettes    Quit date: 04/30/1993  . Smokeless tobacco: Former Neurosurgeon    Types: Chew  . Alcohol use Yes     Comment: occassional  . Drug use: No  . Sexual activity: Not on file

## 2016-12-20 NOTE — Telephone Encounter (Signed)
I spoke with [place], and advised of patients weight status per Brain.

## 2016-12-20 NOTE — Telephone Encounter (Signed)
PWB  ADVANCING OVER NEXT 2 WEEKS TO [facility]WB

## 2016-12-20 NOTE — Telephone Encounter (Signed)
Please let me know and I will call

## 2016-12-24 ENCOUNTER — Non-Acute Institutional Stay (SKILLED_NURSING_FACILITY): Payer: Medicare Other | Admitting: Internal Medicine

## 2016-12-24 ENCOUNTER — Encounter: Payer: Self-pay | Admitting: Internal Medicine

## 2016-12-24 DIAGNOSIS — E875 Hyperkalemia: Secondary | ICD-10-CM

## 2016-12-24 DIAGNOSIS — J9611 Chronic respiratory failure with hypoxia: Secondary | ICD-10-CM

## 2016-12-24 DIAGNOSIS — M1711 Unilateral primary osteoarthritis, right knee: Secondary | ICD-10-CM | POA: Diagnosis not present

## 2016-12-24 DIAGNOSIS — E538 Deficiency of other specified B group vitamins: Secondary | ICD-10-CM | POA: Diagnosis not present

## 2016-12-24 DIAGNOSIS — I5032 Chronic diastolic (congestive) heart failure: Secondary | ICD-10-CM

## 2016-12-24 DIAGNOSIS — G4733 Obstructive sleep apnea (adult) (pediatric): Secondary | ICD-10-CM

## 2016-12-24 DIAGNOSIS — E785 Hyperlipidemia, unspecified: Secondary | ICD-10-CM

## 2016-12-24 DIAGNOSIS — J849 Interstitial pulmonary disease, unspecified: Secondary | ICD-10-CM | POA: Diagnosis not present

## 2016-12-24 DIAGNOSIS — Z96652 Presence of left artificial knee joint: Secondary | ICD-10-CM | POA: Diagnosis not present

## 2016-12-24 DIAGNOSIS — D696 Thrombocytopenia, unspecified: Secondary | ICD-10-CM

## 2016-12-24 DIAGNOSIS — I10 Essential (primary) hypertension: Secondary | ICD-10-CM

## 2016-12-24 DIAGNOSIS — N400 Enlarged prostate without lower urinary tract symptoms: Secondary | ICD-10-CM

## 2016-12-24 DIAGNOSIS — N179 Acute kidney failure, unspecified: Secondary | ICD-10-CM

## 2016-12-24 DIAGNOSIS — N183 Chronic kidney disease, stage 3 (moderate): Secondary | ICD-10-CM

## 2016-12-24 NOTE — Progress Notes (Signed)
Location:  [facility] Room Number: 109 Place of Service:  SNF [phone])  Provider: Randon Goldsmith. Lyn Hollingshead, MD  PCP: Wilson Singer, MD Patient Care Team: Wilson Singer, MD as PCP - General (Internal Medicine) Kalman Shan, MD as Consulting Physician (Pulmonary Disease) Yates Decamp, MD as Consulting Physician (Cardiology) Pollyann Savoy, MD as Consulting Physician (Rheumatology) Mia Creek, MD as Consulting Physician (Ophthalmology)  Extended Emergency Contact Information Primary Emergency Contact: Kinnear,Nick Address: [address]          [place], [place] [address] [place] of [place] Home Phone: [phone] Mobile Phone: [phone] Relation: Son Secondary Emergency Contact: Latona,Jasuben Address: [address]          [place], [place] [address] D[place] of [place] Home Phone: [phone] Mobile Phone: [phone] Relation: Spouse  Allergies  Allergen Reactions  . No Known Allergies     Chief Complaint  Patient presents with  . Discharge Note    discharge from SNF to home    HPI:  76 y.o. male  with chronic diastolic congestive heart failure, hypertension, interstitial lung disease, chronic kidney disease stage III, diabetes mellitus type 2 who was admitted to [facility] from 8/7-9 for a planned total knee replacement for right knee osteoarthritis. Hospital course was, K by postop hypoxia and nonresponsiveness requiring Narcan and the hyperkalemia which was treated with IV fluids and the stopping of patient's Lasix and Cozaar. Patient was admitted to skilled nursing facility in good condition for OT/PT and is now ready to be DC to home.    Past Medical History:  Diagnosis Date  . Allergy, unspecified not elsewhere classified   . Benign prostatic hypertrophy 08/24/2013  . Chronic kidney disease, stage III (moderate)    patient denies  . Chronic respiratory failure with hypoxia (HCC) 12/05/2016  . Dyspnea  on exertion 04/10/2013  . External hemorrhoids without mention of complication   . Gout, unspecified   . Hypertension   . Hypertrophy of prostate without urinary obstruction and other lower urinary tract symptoms (LUTS)   . Interstitial lung disease (HCC) 10/29/2014  . Maculopapular rash, generalized 10/02/2012  . Obesity, unspecified   . On home oxygen therapy    in the morning and evening, with exertion, as needed when O2 drops to 80s  . Organic sleep apnea, unspecified    CPAP, presure setting 3  . OSA (obstructive sleep apnea) 10/09/2013  . Osteoarthrosis, unspecified whether generalized or localized, unspecified site   . Other abnormal glucose   . Other and unspecified hyperlipidemia   . Other atopic dermatitis and related conditions   . Other specified visual disturbances   . Pain in joint, lower leg   . Pain in limb   . Primary osteoarthritis of right knee 10/29/2014  . 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 Type II or unspecified type diabetes mellitus without mention of complication, not stated as uncontrolled 08/24/2013  . Unspecified disorder of kidney and ureter   . Unspecified essential hypertension   . Urticaria, unspecified     Past Surgical History:  Procedure Laterality Date  . CATARACT EXTRACTION    . JOINT REPLACEMENT    . TOTAL KNEE ARTHROPLASTY Right 12/04/2016   Procedure: RIGHT TOTAL KNEE ARTHROPLASTY;  Surgeon: Valeria Batman, MD;  Location: [facility] OR;  Service: Orthopedics;  Laterality: Right;     reports that he quit smoking about 23 years ago. His smoking use included Cigarettes. He has a  7.50 pack-year smoking history. He has quit using smokeless tobacco. His smokeless tobacco use included Chew. He reports that he drinks alcohol. He reports that he does not use drugs. Social History   Social History  . Marital status: Married    Spouse name: N/A  . Number of children: N/A  . Years of education: N/A   Occupational History  . retired  Art gallery manager    Social History Main Topics  . Smoking status: Former Smoker    Packs/day: 0.50    Years: 15.00    Types: Cigarettes    Quit date: 04/30/1993  . Smokeless tobacco: Former Neurosurgeon    Types: Chew  . Alcohol use Yes     Comment: occassional  . Drug use: No  . Sexual activity: Not on file   Other Topics Concern  . Not on file   Social History Narrative   Admitted to [facility] 12/06/16   Married - [name]   Former smoker -stopped 1995   Former chewing    Alcohol occasionally   Full Code        Pertinent  Health Maintenance Due  Topic Date Due  . FOOT EXAM  07/09/1949  . OPHTHALMOLOGY EXAM  09/14/2015  . HEMOGLOBIN A1C  06/27/2016  . INFLUENZA VACCINE  11/28/2016  . PNA vac Low Risk Adult  Completed    Medications: Allergies as of 12/24/2016      Reactions   No Known Allergies       Medication List       Accurate as of 12/24/16 12:47 PM. Always use your most recent med list.          albuterol 108 (90 Base) MCG/ACT inhaler Commonly known as:  PROVENTIL HFA;VENTOLIN HFA Inhale 2 puffs into the lungs. Into lungs every 6 hours as needed for wheezing or shortness of breath or cough   allopurinol 100 MG tablet Commonly known as:  ZYLOPRIM Take 1 tablet (100 mg total) by mouth daily. For gout   aspirin 325 MG tablet Take 1 tablet (325 mg total) by mouth 2 (two) times daily after a meal.   atorvastatin 80 MG tablet Commonly known as:  LIPITOR Take 80 mg by mouth daily.   CALCIUM PO Take 500 mg by mouth daily.   cholecalciferol 1000 units tablet Commonly known as:  VITAMIN D Take 1,000 Units by mouth daily.   furosemide 20 MG tablet Commonly known as:  LASIX Take 1 tablet (20 mg total) by mouth daily. For swelling of legs   HYDROcodone-acetaminophen 5-325 MG tablet Commonly known as:  NORCO/VICODIN Take 1 tablet by mouth every 6 (six) hours as needed for moderate pain.   KLS ALLER-TEC 10 MG tablet Generic drug:  cetirizine Take 10 mg  by mouth daily as needed (for allergies.).   losartan 100 MG tablet Commonly known as:  COZAAR Take 100 mg by mouth daily.   LUBRICANT DROPS 1.4 % ophthalmic solution Generic drug:  polyvinyl alcohol Place 1 drop into both eyes 3 (three) times daily as needed for dry eyes.   methocarbamol 500 MG tablet Commonly known as:  ROBAXIN Take 500 mg by mouth. Take one tablet three times daily for muscle spasms   metoprolol succinate 100 MG 24 hr tablet Commonly known as:  TOPROL-XL TAKE 1 TABLET (100 MG TOTAL) BY MOUTH DAILY.   tamsulosin 0.4 MG Caps capsule Commonly known as:  FLOMAX TAKE 1 CAPSULE BY MOUTH ONCE A DAY FOR YOUR PROSTATE  TYLENOL 325 MG tablet Generic drug:  acetaminophen Take 650 mg by mouth. Take two tablets (650 mg) every 6 hours as needed for pain   Vitamin B-12 2500 MCG Subl Place 2,500 mcg under the tongue daily.            Discharge Care Instructions        Start     Ordered   12/24/16 0000  CBC and differential    Comments:  This external order was created through the Results Console.    12/24/16 1204   12/24/16 0000  Basic metabolic panel    Comments:  This external order was created through the Results Console.    12/24/16 1204       Vitals:   12/24/16 1148  BP: (!) 157/81  Pulse: 72  Resp: 20  Temp: 98.2 F (36.8 C)  SpO2: 96%  Weight: 165 lb 12.8 oz (75.2 kg)  Height: 5' 6.5" (1.689 m)   Body mass index is 26.36 kg/m.  Physical Exam  GENERAL APPEARANCE: Alert, conversant. No acute distress.  HEENT: Unremarkable. RESPIRATORY: Breathing is even, unlabored. Lung sounds are clear   CARDIOVASCULAR: Heart RRR no murmurs, rubs or gallops. No peripheral edema.  GASTROINTESTINAL: Abdomen is soft, non-tender, not distended w/ normal bowel sounds.  NEUROLOGIC: Cranial nerves 2-12 grossly intact. Moves all extremities   Labs reviewed: Basic Metabolic Panel:  Recent Labs  16/10/96 0436 12/05/16 0721 12/06/16 0550 12/11/16  NA  136 137 137 138  K 6.3* 4.0 5.0 4.6  CL 110 115* 111  --   CO2 19* 17* 18*  --   GLUCOSE 104* 119* 128*  --   BUN 23* 20 24* 21  CREATININE 2.14* 1.81* 1.94* 1.4*  CALCIUM 7.9* 6.3* 8.0*  --    No results found for: [facility] Liver Function Tests:  Recent Labs  12/27/15 0959 11/26/16 0853  AST 16 24  ALT 10 16*  ALKPHOS 59 61  BILITOT 1.0 1.3*  PROT 5.9* 6.3*  ALBUMIN 3.5* 3.6   No results for input(s): LIPASE, AMYLASE in the last 8760 hours. No results for input(s): AMMONIA in the last 8760 hours. CBC:  Recent Labs  12/27/15 0959 11/26/16 0853 12/04/16 1249 12/05/16 0436 12/06/16 0550 12/11/16 12/12/16  WBC 7.8 8.7 10.1 13.0* 16.8* 14.6 13.7  NEUTROABS 4,914 5.4  --   --   --   --   --   HGB 14.0 15.2 13.4 13.3 12.8* 12.7* 11.3*  HCT 44.6 46.2 43.0 42.0 39.4 41 36*  MCV 74.7* 72.3* 73.4* 75.4* 74.3*  --   --   PLT 208 167 148* 137* 132* 217 216   Lipid No results for input(s): CHOL, HDL, LDLCALC, TRIG in the last 8760 hours. Cardiac Enzymes: No results for input(s): CKTOTAL, CKMB, CKMBINDEX, TROPONINI in the last 8760 hours. BNP: No results for input(s): BNP in the last 8760 hours. CBG:  Recent Labs  12/04/16 1957  GLUCAP 138*    Procedures and Imaging Studies During Stay: Dg Chest Port 1 View  Result Date: 12/05/2016 CLINICAL DATA:  Cough. One day postop from right knee arthroplasty. Chronic kidney disease stage 3. EXAM: PORTABLE CHEST 1 VIEW COMPARISON:  03/11/2015 FINDINGS: The heart size and mediastinal contours are within normal limits. Low lung volumes are noted, however both lungs are clear. No evidence of pneumothorax or pleural effusion. The visualized skeletal structures are unremarkable. IMPRESSION: Low lung volumes.  No active disease. Electronically Signed   By: Alver Sorrow.D.  On: 12/05/2016 11:35   Xr Knee 3 View Right  Result Date: 12/19/2016 Three-view x-ray of the right knee reveals excellent position alignment of the prosthesis. No  radiolucencies noted.   Assessment/Plan:   Primary osteoarthritis of right knee  S/P total knee replacement using cement, left  Interstitial lung disease (HCC)  OSA (obstructive sleep apnea)  Chronic respiratory failure with hypoxia (HCC)  Chronic diastolic heart failure, NYHA class 2 (HCC)  Essential hypertension  Acute renal failure superimposed on stage 3 chronic kidney disease, unspecified acute renal failure type (HCC)  BPH without obstruction/lower urinary tract symptoms  Acute hyperkalemia  B12 deficiency  Hyperlipidemia, unspecified hyperlipidemia type  Thrombocytopenia (HCC)   Patient is being discharged with the following home health services:  OT/PT/nursing  Patient is being discharged with the following durable medical equipment:  Wheelchair  Patient has been advised to f/u with their PCP in 1-2 weeks to bring them up to date on their rehab stay.  Social services at facility was responsible for arranging this appointment.  Pt was provided with a 30 day supply of prescriptions for medications and refills must be obtained from their PCP.  For controlled substances, a more limited supply may be provided adequate until PCP appointment only.  Medications have been reconciled.   Time spent greater than 30 minutes;> 50% of time with patient was spent reviewing records, labs, tests and studies, counseling and developing plan of care  Randon Goldsmith. Lyn Hollingshead, MD

## 2016-12-29 DIAGNOSIS — Z471 Aftercare following joint replacement surgery: Secondary | ICD-10-CM | POA: Diagnosis not present

## 2016-12-29 DIAGNOSIS — J849 Interstitial pulmonary disease, unspecified: Secondary | ICD-10-CM | POA: Diagnosis not present

## 2016-12-29 DIAGNOSIS — I130 Hypertensive heart and chronic kidney disease with heart failure and stage 1 through stage 4 chronic kidney disease, or unspecified chronic kidney disease: Secondary | ICD-10-CM | POA: Diagnosis not present

## 2016-12-29 DIAGNOSIS — Z79891 Long term (current) use of opiate analgesic: Secondary | ICD-10-CM | POA: Diagnosis not present

## 2016-12-29 DIAGNOSIS — Z9981 Dependence on supplemental oxygen: Secondary | ICD-10-CM | POA: Diagnosis not present

## 2016-12-29 DIAGNOSIS — I5032 Chronic diastolic (congestive) heart failure: Secondary | ICD-10-CM | POA: Diagnosis not present

## 2016-12-29 DIAGNOSIS — N183 Chronic kidney disease, stage 3 (moderate): Secondary | ICD-10-CM | POA: Diagnosis not present

## 2016-12-29 DIAGNOSIS — Z96651 Presence of right artificial knee joint: Secondary | ICD-10-CM | POA: Diagnosis not present

## 2016-12-29 DIAGNOSIS — M109 Gout, unspecified: Secondary | ICD-10-CM | POA: Diagnosis not present

## 2016-12-29 DIAGNOSIS — N400 Enlarged prostate without lower urinary tract symptoms: Secondary | ICD-10-CM | POA: Diagnosis not present

## 2016-12-29 DIAGNOSIS — M1712 Unilateral primary osteoarthritis, left knee: Secondary | ICD-10-CM | POA: Diagnosis not present

## 2016-12-29 DIAGNOSIS — E1122 Type 2 diabetes mellitus with diabetic chronic kidney disease: Secondary | ICD-10-CM | POA: Diagnosis not present

## 2017-01-01 DIAGNOSIS — Z9981 Dependence on supplemental oxygen: Secondary | ICD-10-CM | POA: Diagnosis not present

## 2017-01-01 DIAGNOSIS — E1122 Type 2 diabetes mellitus with diabetic chronic kidney disease: Secondary | ICD-10-CM | POA: Diagnosis not present

## 2017-01-01 DIAGNOSIS — I130 Hypertensive heart and chronic kidney disease with heart failure and stage 1 through stage 4 chronic kidney disease, or unspecified chronic kidney disease: Secondary | ICD-10-CM | POA: Diagnosis not present

## 2017-01-01 DIAGNOSIS — N400 Enlarged prostate without lower urinary tract symptoms: Secondary | ICD-10-CM | POA: Diagnosis not present

## 2017-01-01 DIAGNOSIS — M109 Gout, unspecified: Secondary | ICD-10-CM | POA: Diagnosis not present

## 2017-01-01 DIAGNOSIS — M1712 Unilateral primary osteoarthritis, left knee: Secondary | ICD-10-CM | POA: Diagnosis not present

## 2017-01-01 DIAGNOSIS — N183 Chronic kidney disease, stage 3 (moderate): Secondary | ICD-10-CM | POA: Diagnosis not present

## 2017-01-01 DIAGNOSIS — Z471 Aftercare following joint replacement surgery: Secondary | ICD-10-CM | POA: Diagnosis not present

## 2017-01-01 DIAGNOSIS — J849 Interstitial pulmonary disease, unspecified: Secondary | ICD-10-CM | POA: Diagnosis not present

## 2017-01-01 DIAGNOSIS — Z96651 Presence of right artificial knee joint: Secondary | ICD-10-CM | POA: Diagnosis not present

## 2017-01-01 DIAGNOSIS — Z79891 Long term (current) use of opiate analgesic: Secondary | ICD-10-CM | POA: Diagnosis not present

## 2017-01-01 DIAGNOSIS — I5032 Chronic diastolic (congestive) heart failure: Secondary | ICD-10-CM | POA: Diagnosis not present

## 2017-01-02 DIAGNOSIS — M1712 Unilateral primary osteoarthritis, left knee: Secondary | ICD-10-CM | POA: Diagnosis not present

## 2017-01-02 DIAGNOSIS — J849 Interstitial pulmonary disease, unspecified: Secondary | ICD-10-CM | POA: Diagnosis not present

## 2017-01-02 DIAGNOSIS — N183 Chronic kidney disease, stage 3 (moderate): Secondary | ICD-10-CM | POA: Diagnosis not present

## 2017-01-02 DIAGNOSIS — Z96651 Presence of right artificial knee joint: Secondary | ICD-10-CM | POA: Diagnosis not present

## 2017-01-02 DIAGNOSIS — I5032 Chronic diastolic (congestive) heart failure: Secondary | ICD-10-CM | POA: Diagnosis not present

## 2017-01-02 DIAGNOSIS — I130 Hypertensive heart and chronic kidney disease with heart failure and stage 1 through stage 4 chronic kidney disease, or unspecified chronic kidney disease: Secondary | ICD-10-CM | POA: Diagnosis not present

## 2017-01-02 DIAGNOSIS — Z471 Aftercare following joint replacement surgery: Secondary | ICD-10-CM | POA: Diagnosis not present

## 2017-01-02 DIAGNOSIS — M109 Gout, unspecified: Secondary | ICD-10-CM | POA: Diagnosis not present

## 2017-01-02 DIAGNOSIS — N400 Enlarged prostate without lower urinary tract symptoms: Secondary | ICD-10-CM | POA: Diagnosis not present

## 2017-01-02 DIAGNOSIS — E1122 Type 2 diabetes mellitus with diabetic chronic kidney disease: Secondary | ICD-10-CM | POA: Diagnosis not present

## 2017-01-02 DIAGNOSIS — Z9981 Dependence on supplemental oxygen: Secondary | ICD-10-CM | POA: Diagnosis not present

## 2017-01-02 DIAGNOSIS — Z79891 Long term (current) use of opiate analgesic: Secondary | ICD-10-CM | POA: Diagnosis not present

## 2017-01-07 DIAGNOSIS — Z471 Aftercare following joint replacement surgery: Secondary | ICD-10-CM | POA: Diagnosis not present

## 2017-01-07 DIAGNOSIS — M6281 Muscle weakness (generalized): Secondary | ICD-10-CM | POA: Diagnosis not present

## 2017-01-07 DIAGNOSIS — M109 Gout, unspecified: Secondary | ICD-10-CM | POA: Diagnosis not present

## 2017-01-07 DIAGNOSIS — E1122 Type 2 diabetes mellitus with diabetic chronic kidney disease: Secondary | ICD-10-CM | POA: Diagnosis not present

## 2017-01-07 DIAGNOSIS — M1712 Unilateral primary osteoarthritis, left knee: Secondary | ICD-10-CM | POA: Diagnosis not present

## 2017-01-07 DIAGNOSIS — N183 Chronic kidney disease, stage 3 (moderate): Secondary | ICD-10-CM | POA: Diagnosis not present

## 2017-01-07 DIAGNOSIS — I130 Hypertensive heart and chronic kidney disease with heart failure and stage 1 through stage 4 chronic kidney disease, or unspecified chronic kidney disease: Secondary | ICD-10-CM | POA: Diagnosis not present

## 2017-01-07 DIAGNOSIS — J849 Interstitial pulmonary disease, unspecified: Secondary | ICD-10-CM | POA: Diagnosis not present

## 2017-01-07 DIAGNOSIS — N400 Enlarged prostate without lower urinary tract symptoms: Secondary | ICD-10-CM | POA: Diagnosis not present

## 2017-01-07 DIAGNOSIS — J8410 Pulmonary fibrosis, unspecified: Secondary | ICD-10-CM | POA: Diagnosis not present

## 2017-01-07 DIAGNOSIS — Z9981 Dependence on supplemental oxygen: Secondary | ICD-10-CM | POA: Diagnosis not present

## 2017-01-07 DIAGNOSIS — Z96651 Presence of right artificial knee joint: Secondary | ICD-10-CM | POA: Diagnosis not present

## 2017-01-07 DIAGNOSIS — Z79891 Long term (current) use of opiate analgesic: Secondary | ICD-10-CM | POA: Diagnosis not present

## 2017-01-07 DIAGNOSIS — I5032 Chronic diastolic (congestive) heart failure: Secondary | ICD-10-CM | POA: Diagnosis not present

## 2017-01-09 DIAGNOSIS — J849 Interstitial pulmonary disease, unspecified: Secondary | ICD-10-CM | POA: Diagnosis not present

## 2017-01-09 DIAGNOSIS — Z471 Aftercare following joint replacement surgery: Secondary | ICD-10-CM | POA: Diagnosis not present

## 2017-01-09 DIAGNOSIS — N400 Enlarged prostate without lower urinary tract symptoms: Secondary | ICD-10-CM | POA: Diagnosis not present

## 2017-01-09 DIAGNOSIS — I130 Hypertensive heart and chronic kidney disease with heart failure and stage 1 through stage 4 chronic kidney disease, or unspecified chronic kidney disease: Secondary | ICD-10-CM | POA: Diagnosis not present

## 2017-01-09 DIAGNOSIS — E1122 Type 2 diabetes mellitus with diabetic chronic kidney disease: Secondary | ICD-10-CM | POA: Diagnosis not present

## 2017-01-09 DIAGNOSIS — I5032 Chronic diastolic (congestive) heart failure: Secondary | ICD-10-CM | POA: Diagnosis not present

## 2017-01-09 DIAGNOSIS — N183 Chronic kidney disease, stage 3 (moderate): Secondary | ICD-10-CM | POA: Diagnosis not present

## 2017-01-09 DIAGNOSIS — M1712 Unilateral primary osteoarthritis, left knee: Secondary | ICD-10-CM | POA: Diagnosis not present

## 2017-01-09 DIAGNOSIS — Z9981 Dependence on supplemental oxygen: Secondary | ICD-10-CM | POA: Diagnosis not present

## 2017-01-09 DIAGNOSIS — Z96651 Presence of right artificial knee joint: Secondary | ICD-10-CM | POA: Diagnosis not present

## 2017-01-09 DIAGNOSIS — Z79891 Long term (current) use of opiate analgesic: Secondary | ICD-10-CM | POA: Diagnosis not present

## 2017-01-09 DIAGNOSIS — M109 Gout, unspecified: Secondary | ICD-10-CM | POA: Diagnosis not present

## 2017-01-11 DIAGNOSIS — M1712 Unilateral primary osteoarthritis, left knee: Secondary | ICD-10-CM | POA: Diagnosis not present

## 2017-01-11 DIAGNOSIS — N183 Chronic kidney disease, stage 3 (moderate): Secondary | ICD-10-CM | POA: Diagnosis not present

## 2017-01-11 DIAGNOSIS — J849 Interstitial pulmonary disease, unspecified: Secondary | ICD-10-CM | POA: Diagnosis not present

## 2017-01-11 DIAGNOSIS — Z79891 Long term (current) use of opiate analgesic: Secondary | ICD-10-CM | POA: Diagnosis not present

## 2017-01-11 DIAGNOSIS — Z9981 Dependence on supplemental oxygen: Secondary | ICD-10-CM | POA: Diagnosis not present

## 2017-01-11 DIAGNOSIS — I130 Hypertensive heart and chronic kidney disease with heart failure and stage 1 through stage 4 chronic kidney disease, or unspecified chronic kidney disease: Secondary | ICD-10-CM | POA: Diagnosis not present

## 2017-01-11 DIAGNOSIS — Z96651 Presence of right artificial knee joint: Secondary | ICD-10-CM | POA: Diagnosis not present

## 2017-01-11 DIAGNOSIS — N400 Enlarged prostate without lower urinary tract symptoms: Secondary | ICD-10-CM | POA: Diagnosis not present

## 2017-01-11 DIAGNOSIS — I5032 Chronic diastolic (congestive) heart failure: Secondary | ICD-10-CM | POA: Diagnosis not present

## 2017-01-11 DIAGNOSIS — E1122 Type 2 diabetes mellitus with diabetic chronic kidney disease: Secondary | ICD-10-CM | POA: Diagnosis not present

## 2017-01-11 DIAGNOSIS — Z471 Aftercare following joint replacement surgery: Secondary | ICD-10-CM | POA: Diagnosis not present

## 2017-01-11 DIAGNOSIS — M109 Gout, unspecified: Secondary | ICD-10-CM | POA: Diagnosis not present

## 2017-01-14 ENCOUNTER — Encounter (INDEPENDENT_AMBULATORY_CARE_PROVIDER_SITE_OTHER): Payer: Self-pay | Admitting: Orthopaedic Surgery

## 2017-01-14 ENCOUNTER — Ambulatory Visit (INDEPENDENT_AMBULATORY_CARE_PROVIDER_SITE_OTHER): Payer: Medicare Other | Admitting: Orthopaedic Surgery

## 2017-01-14 VITALS — BP 140/78 | HR 79 | Resp 14 | Ht 69.0 in | Wt 165.0 lb

## 2017-01-14 DIAGNOSIS — M1712 Unilateral primary osteoarthritis, left knee: Secondary | ICD-10-CM | POA: Diagnosis not present

## 2017-01-14 DIAGNOSIS — N183 Chronic kidney disease, stage 3 (moderate): Secondary | ICD-10-CM | POA: Diagnosis not present

## 2017-01-14 DIAGNOSIS — Z471 Aftercare following joint replacement surgery: Secondary | ICD-10-CM | POA: Diagnosis not present

## 2017-01-14 DIAGNOSIS — E1122 Type 2 diabetes mellitus with diabetic chronic kidney disease: Secondary | ICD-10-CM | POA: Diagnosis not present

## 2017-01-14 DIAGNOSIS — Z96651 Presence of right artificial knee joint: Secondary | ICD-10-CM | POA: Diagnosis not present

## 2017-01-14 DIAGNOSIS — M109 Gout, unspecified: Secondary | ICD-10-CM | POA: Diagnosis not present

## 2017-01-14 DIAGNOSIS — I5032 Chronic diastolic (congestive) heart failure: Secondary | ICD-10-CM | POA: Diagnosis not present

## 2017-01-14 DIAGNOSIS — Z79891 Long term (current) use of opiate analgesic: Secondary | ICD-10-CM | POA: Diagnosis not present

## 2017-01-14 DIAGNOSIS — J849 Interstitial pulmonary disease, unspecified: Secondary | ICD-10-CM | POA: Diagnosis not present

## 2017-01-14 DIAGNOSIS — N400 Enlarged prostate without lower urinary tract symptoms: Secondary | ICD-10-CM | POA: Diagnosis not present

## 2017-01-14 DIAGNOSIS — Z9981 Dependence on supplemental oxygen: Secondary | ICD-10-CM | POA: Diagnosis not present

## 2017-01-14 DIAGNOSIS — I130 Hypertensive heart and chronic kidney disease with heart failure and stage 1 through stage 4 chronic kidney disease, or unspecified chronic kidney disease: Secondary | ICD-10-CM | POA: Diagnosis not present

## 2017-01-14 NOTE — Progress Notes (Signed)
Office Visit Note   Patient: Stanley Sullivan           Date of Birth: 20-Mar-1941           MRN: [id number] Visit Date: 01/14/2017              Requested by: Wilson Singer, MD No address on file PCP: Wilson Singer, MD   Assessment & Plan: Visit Diagnoses:  1. History of total right knee replacement     Plan: Approximately 6 weeks status post primary right total knee replacement and doing well. Still receiving physical therapy at home. Urged him to work out at [employer] with exercises continue using the cane as necessary. Office 3 months  Follow-Up Instructions: Return in about 3 months (around 04/15/2017).   Orders:  No orders of the defined types were placed in this encounter.  No orders of the defined types were placed in this encounter.     Procedures: No procedures performed   Clinical Data: No additional findings.   Subjective: Chief Complaint  Patient presents with  . Right Ankle - Routine Post Op    Stanley Sullivan is a 76 y o status post 7 weeks Right TKA on 12/04/2016. Pt relates he has pain behind his knee, slightly hot,no fever/chills. Ambulates with a cane  Presently comfortable using a single-point cane. Still receiving home health physical therapy. [facility]. Denies fever or chills shortness of breath or chest pain. Mild right knee pain  HPI  Review of Systems  Constitutional: Negative for fatigue.  HENT: Negative for hearing loss.   Respiratory: Negative for apnea, chest tightness and shortness of breath.   Cardiovascular: Negative for chest pain, palpitations and leg swelling.  Gastrointestinal: Negative for blood in stool, constipation and diarrhea.  Genitourinary: Negative for difficulty urinating.  Musculoskeletal: Negative for arthralgias, back pain, joint swelling, myalgias, neck pain and neck stiffness.  Neurological: Negative for weakness, numbness and headaches.  Hematological: Does not bruise/bleed  easily.  Psychiatric/Behavioral: Negative for sleep disturbance. The patient is not nervous/anxious.      Objective: Vital Signs: BP 140/78   Pulse 79   Resp 14   Ht  (1.753 m)   Wt 165 lb (74.8 kg)   BMI 24.37 kg/m   Physical Exam  Ortho Exam right knee with a well-healed total knee incision. Minimal effusion. Knee wasn't particularly hot warm or red. No opening with a varus or valgus stress. Negative anterior drawer sign normal swelling in the ankle that was nonpitting. Neurovascular exam intact. Flexed about 106-7 with a goniometer. No significant tenderness  Specialty Comments:  No specialty comments available.  Imaging: No results found.   PMFS History: Patient Active Problem List   Diagnosis Date Noted  . Thrombocytopenia (HCC) 12/08/2016  . B12 deficiency 12/08/2016  . Hypertension 12/05/2016  . Acute renal failure superimposed on stage 3 chronic kidney disease (HCC) 12/05/2016  . Chronic respiratory failure with hypoxia (HCC) 12/05/2016  . Chronic diastolic heart failure, NYHA class 2 (HCC) 12/05/2016  . Acute hyperkalemia 12/05/2016  . Hypocalcemia 12/05/2016  . AKI (acute kidney injury) (HCC)   . 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 Interstitial lung disease (HCC) 10/29/2014  . Primary osteoarthritis of right knee 10/29/2014  . DM (diabetes mellitus) type II controlled with renal manifestation (HCC) 10/29/2014  . OSA (obstructive sleep apnea) 10/09/2013  . ILD (interstitial lung disease) (HCC) 10/09/2013  .  Cough 10/07/2013  . Dyspnea 10/07/2013  . Osteoarthritis of left knee 08/24/2013  . Benign prostatic hypertrophy 08/24/2013  . Essential hypertension, benign 08/24/2013  . Gout 08/24/2013  . Type II or unspecified type diabetes mellitus without mention of complication, not stated as uncontrolled 08/24/2013  . Chronic idiopathic constipation 04/10/2013  . Dyspnea on exertion 04/10/2013  . Osteoarthrosis, unspecified whether  generalized or localized, unspecified site   . Chronic kidney disease, stage III (moderate)   . Obesity, unspecified   . Gout, unspecified   . Hyperlipidemia   . Essential hypertension   . BPH without obstruction/lower urinary tract symptoms   . Maculopapular rash, generalized 10/02/2012   Past Medical History:  Diagnosis Date  . Allergy, unspecified not elsewhere classified   . Benign prostatic hypertrophy 08/24/2013  . Chronic kidney disease, stage III (moderate)    patient denies  . Chronic respiratory failure with hypoxia (HCC) 12/05/2016  . Dyspnea on exertion 04/10/2013  . External hemorrhoids without mention of complication   . Gout, unspecified   . Hypertension   . Hypertrophy of prostate without urinary obstruction and other lower urinary tract symptoms (LUTS)   . Interstitial lung disease (HCC) 10/29/2014  . Maculopapular rash, generalized 10/02/2012  . Obesity, unspecified   . On home oxygen therapy    in the morning and evening, with exertion, as needed when O2 drops to 80s  . Organic sleep apnea, unspecified    CPAP, presure setting 3  . OSA (obstructive sleep apnea) 10/09/2013  . Osteoarthrosis, unspecified whether generalized or localized, unspecified site   . Other abnormal glucose   . Other and unspecified hyperlipidemia   . Other atopic dermatitis and related conditions   . Other specified visual disturbances   . Pain in joint, lower leg   . Pain in limb   . Primary osteoarthritis of right knee 10/29/2014  . 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 Type II or unspecified type diabetes mellitus without mention of complication, not stated as uncontrolled 08/24/2013  . Unspecified disorder of kidney and ureter   . Unspecified essential hypertension   . Urticaria, unspecified     History reviewed. No pertinent family history.  Past Surgical History:  Procedure Laterality Date  . CATARACT EXTRACTION    . JOINT REPLACEMENT    . TOTAL KNEE ARTHROPLASTY  Right 12/04/2016   Procedure: RIGHT TOTAL KNEE ARTHROPLASTY;  Surgeon: Valeria Batman, MD;  Location: [facility] OR;  Service: Orthopedics;  Laterality: Right;   Social History   Occupational History  . retired Art gallery manager    Social History Main Topics  . Smoking status: Former Smoker    Packs/day: 0.50    Years: 15.00    Types: Cigarettes    Quit date: 04/30/1993  . Smokeless tobacco: Former Neurosurgeon    Types: Chew  . Alcohol use Yes     Comment: occassional  . Drug use: No  . Sexual activity: Not on file

## 2017-01-16 DIAGNOSIS — I5032 Chronic diastolic (congestive) heart failure: Secondary | ICD-10-CM | POA: Diagnosis not present

## 2017-01-16 DIAGNOSIS — M1712 Unilateral primary osteoarthritis, left knee: Secondary | ICD-10-CM | POA: Diagnosis not present

## 2017-01-16 DIAGNOSIS — E1122 Type 2 diabetes mellitus with diabetic chronic kidney disease: Secondary | ICD-10-CM | POA: Diagnosis not present

## 2017-01-16 DIAGNOSIS — Z471 Aftercare following joint replacement surgery: Secondary | ICD-10-CM | POA: Diagnosis not present

## 2017-01-16 DIAGNOSIS — N400 Enlarged prostate without lower urinary tract symptoms: Secondary | ICD-10-CM | POA: Diagnosis not present

## 2017-01-16 DIAGNOSIS — I130 Hypertensive heart and chronic kidney disease with heart failure and stage 1 through stage 4 chronic kidney disease, or unspecified chronic kidney disease: Secondary | ICD-10-CM | POA: Diagnosis not present

## 2017-01-16 DIAGNOSIS — M109 Gout, unspecified: Secondary | ICD-10-CM | POA: Diagnosis not present

## 2017-01-16 DIAGNOSIS — N183 Chronic kidney disease, stage 3 (moderate): Secondary | ICD-10-CM | POA: Diagnosis not present

## 2017-01-16 DIAGNOSIS — Z96651 Presence of right artificial knee joint: Secondary | ICD-10-CM | POA: Diagnosis not present

## 2017-01-16 DIAGNOSIS — Z9981 Dependence on supplemental oxygen: Secondary | ICD-10-CM | POA: Diagnosis not present

## 2017-01-16 DIAGNOSIS — J849 Interstitial pulmonary disease, unspecified: Secondary | ICD-10-CM | POA: Diagnosis not present

## 2017-01-16 DIAGNOSIS — Z79891 Long term (current) use of opiate analgesic: Secondary | ICD-10-CM | POA: Diagnosis not present

## 2017-01-21 ENCOUNTER — Ambulatory Visit (INDEPENDENT_AMBULATORY_CARE_PROVIDER_SITE_OTHER): Payer: Medicare Other | Admitting: Orthopaedic Surgery

## 2017-01-21 DIAGNOSIS — Z96651 Presence of right artificial knee joint: Secondary | ICD-10-CM | POA: Diagnosis not present

## 2017-01-21 DIAGNOSIS — M1712 Unilateral primary osteoarthritis, left knee: Secondary | ICD-10-CM | POA: Diagnosis not present

## 2017-01-21 DIAGNOSIS — I130 Hypertensive heart and chronic kidney disease with heart failure and stage 1 through stage 4 chronic kidney disease, or unspecified chronic kidney disease: Secondary | ICD-10-CM | POA: Diagnosis not present

## 2017-01-21 DIAGNOSIS — I5032 Chronic diastolic (congestive) heart failure: Secondary | ICD-10-CM | POA: Diagnosis not present

## 2017-01-21 DIAGNOSIS — E1122 Type 2 diabetes mellitus with diabetic chronic kidney disease: Secondary | ICD-10-CM | POA: Diagnosis not present

## 2017-01-21 DIAGNOSIS — N400 Enlarged prostate without lower urinary tract symptoms: Secondary | ICD-10-CM | POA: Diagnosis not present

## 2017-01-21 DIAGNOSIS — Z471 Aftercare following joint replacement surgery: Secondary | ICD-10-CM | POA: Diagnosis not present

## 2017-01-21 DIAGNOSIS — N183 Chronic kidney disease, stage 3 (moderate): Secondary | ICD-10-CM | POA: Diagnosis not present

## 2017-01-21 DIAGNOSIS — Z9981 Dependence on supplemental oxygen: Secondary | ICD-10-CM | POA: Diagnosis not present

## 2017-01-21 DIAGNOSIS — M109 Gout, unspecified: Secondary | ICD-10-CM | POA: Diagnosis not present

## 2017-01-21 DIAGNOSIS — Z79891 Long term (current) use of opiate analgesic: Secondary | ICD-10-CM | POA: Diagnosis not present

## 2017-01-21 DIAGNOSIS — J849 Interstitial pulmonary disease, unspecified: Secondary | ICD-10-CM | POA: Diagnosis not present

## 2017-01-22 DIAGNOSIS — I5032 Chronic diastolic (congestive) heart failure: Secondary | ICD-10-CM | POA: Diagnosis not present

## 2017-01-22 DIAGNOSIS — M1712 Unilateral primary osteoarthritis, left knee: Secondary | ICD-10-CM | POA: Diagnosis not present

## 2017-01-22 DIAGNOSIS — N183 Chronic kidney disease, stage 3 (moderate): Secondary | ICD-10-CM | POA: Diagnosis not present

## 2017-01-22 DIAGNOSIS — I130 Hypertensive heart and chronic kidney disease with heart failure and stage 1 through stage 4 chronic kidney disease, or unspecified chronic kidney disease: Secondary | ICD-10-CM | POA: Diagnosis not present

## 2017-01-22 DIAGNOSIS — Z79891 Long term (current) use of opiate analgesic: Secondary | ICD-10-CM | POA: Diagnosis not present

## 2017-01-22 DIAGNOSIS — Z96651 Presence of right artificial knee joint: Secondary | ICD-10-CM | POA: Diagnosis not present

## 2017-01-22 DIAGNOSIS — N400 Enlarged prostate without lower urinary tract symptoms: Secondary | ICD-10-CM | POA: Diagnosis not present

## 2017-01-22 DIAGNOSIS — Z23 Encounter for immunization: Secondary | ICD-10-CM | POA: Diagnosis not present

## 2017-01-22 DIAGNOSIS — Z471 Aftercare following joint replacement surgery: Secondary | ICD-10-CM | POA: Diagnosis not present

## 2017-01-22 DIAGNOSIS — E7800 Pure hypercholesterolemia, unspecified: Secondary | ICD-10-CM | POA: Diagnosis not present

## 2017-01-22 DIAGNOSIS — I10 Essential (primary) hypertension: Secondary | ICD-10-CM | POA: Diagnosis not present

## 2017-01-22 DIAGNOSIS — E1122 Type 2 diabetes mellitus with diabetic chronic kidney disease: Secondary | ICD-10-CM | POA: Diagnosis not present

## 2017-01-22 DIAGNOSIS — M25569 Pain in unspecified knee: Secondary | ICD-10-CM | POA: Diagnosis not present

## 2017-01-22 DIAGNOSIS — J849 Interstitial pulmonary disease, unspecified: Secondary | ICD-10-CM | POA: Diagnosis not present

## 2017-01-22 DIAGNOSIS — M109 Gout, unspecified: Secondary | ICD-10-CM | POA: Diagnosis not present

## 2017-01-22 DIAGNOSIS — Z9981 Dependence on supplemental oxygen: Secondary | ICD-10-CM | POA: Diagnosis not present

## 2017-01-22 DIAGNOSIS — M1710 Unilateral primary osteoarthritis, unspecified knee: Secondary | ICD-10-CM | POA: Diagnosis not present

## 2017-01-23 DIAGNOSIS — Z96651 Presence of right artificial knee joint: Secondary | ICD-10-CM | POA: Diagnosis not present

## 2017-01-23 DIAGNOSIS — I130 Hypertensive heart and chronic kidney disease with heart failure and stage 1 through stage 4 chronic kidney disease, or unspecified chronic kidney disease: Secondary | ICD-10-CM | POA: Diagnosis not present

## 2017-01-23 DIAGNOSIS — Z79891 Long term (current) use of opiate analgesic: Secondary | ICD-10-CM | POA: Diagnosis not present

## 2017-01-23 DIAGNOSIS — N400 Enlarged prostate without lower urinary tract symptoms: Secondary | ICD-10-CM | POA: Diagnosis not present

## 2017-01-23 DIAGNOSIS — M1712 Unilateral primary osteoarthritis, left knee: Secondary | ICD-10-CM | POA: Diagnosis not present

## 2017-01-23 DIAGNOSIS — I5032 Chronic diastolic (congestive) heart failure: Secondary | ICD-10-CM | POA: Diagnosis not present

## 2017-01-23 DIAGNOSIS — J849 Interstitial pulmonary disease, unspecified: Secondary | ICD-10-CM | POA: Diagnosis not present

## 2017-01-23 DIAGNOSIS — N183 Chronic kidney disease, stage 3 (moderate): Secondary | ICD-10-CM | POA: Diagnosis not present

## 2017-01-23 DIAGNOSIS — Z9981 Dependence on supplemental oxygen: Secondary | ICD-10-CM | POA: Diagnosis not present

## 2017-01-23 DIAGNOSIS — E1122 Type 2 diabetes mellitus with diabetic chronic kidney disease: Secondary | ICD-10-CM | POA: Diagnosis not present

## 2017-01-23 DIAGNOSIS — M109 Gout, unspecified: Secondary | ICD-10-CM | POA: Diagnosis not present

## 2017-01-23 DIAGNOSIS — Z471 Aftercare following joint replacement surgery: Secondary | ICD-10-CM | POA: Diagnosis not present

## 2017-01-25 DIAGNOSIS — J8410 Pulmonary fibrosis, unspecified: Secondary | ICD-10-CM | POA: Diagnosis not present

## 2017-01-28 DIAGNOSIS — Z471 Aftercare following joint replacement surgery: Secondary | ICD-10-CM | POA: Diagnosis not present

## 2017-01-28 DIAGNOSIS — I130 Hypertensive heart and chronic kidney disease with heart failure and stage 1 through stage 4 chronic kidney disease, or unspecified chronic kidney disease: Secondary | ICD-10-CM | POA: Diagnosis not present

## 2017-01-28 DIAGNOSIS — J849 Interstitial pulmonary disease, unspecified: Secondary | ICD-10-CM | POA: Diagnosis not present

## 2017-01-28 DIAGNOSIS — M1712 Unilateral primary osteoarthritis, left knee: Secondary | ICD-10-CM | POA: Diagnosis not present

## 2017-01-28 DIAGNOSIS — Z79891 Long term (current) use of opiate analgesic: Secondary | ICD-10-CM | POA: Diagnosis not present

## 2017-01-28 DIAGNOSIS — N400 Enlarged prostate without lower urinary tract symptoms: Secondary | ICD-10-CM | POA: Diagnosis not present

## 2017-01-28 DIAGNOSIS — M109 Gout, unspecified: Secondary | ICD-10-CM | POA: Diagnosis not present

## 2017-01-28 DIAGNOSIS — Z9981 Dependence on supplemental oxygen: Secondary | ICD-10-CM | POA: Diagnosis not present

## 2017-01-28 DIAGNOSIS — I5032 Chronic diastolic (congestive) heart failure: Secondary | ICD-10-CM | POA: Diagnosis not present

## 2017-01-28 DIAGNOSIS — N183 Chronic kidney disease, stage 3 (moderate): Secondary | ICD-10-CM | POA: Diagnosis not present

## 2017-01-28 DIAGNOSIS — Z96651 Presence of right artificial knee joint: Secondary | ICD-10-CM | POA: Diagnosis not present

## 2017-01-28 DIAGNOSIS — E1122 Type 2 diabetes mellitus with diabetic chronic kidney disease: Secondary | ICD-10-CM | POA: Diagnosis not present

## 2017-01-30 DIAGNOSIS — Z9981 Dependence on supplemental oxygen: Secondary | ICD-10-CM | POA: Diagnosis not present

## 2017-01-30 DIAGNOSIS — I5032 Chronic diastolic (congestive) heart failure: Secondary | ICD-10-CM | POA: Diagnosis not present

## 2017-01-30 DIAGNOSIS — Z471 Aftercare following joint replacement surgery: Secondary | ICD-10-CM | POA: Diagnosis not present

## 2017-01-30 DIAGNOSIS — I130 Hypertensive heart and chronic kidney disease with heart failure and stage 1 through stage 4 chronic kidney disease, or unspecified chronic kidney disease: Secondary | ICD-10-CM | POA: Diagnosis not present

## 2017-01-30 DIAGNOSIS — E1122 Type 2 diabetes mellitus with diabetic chronic kidney disease: Secondary | ICD-10-CM | POA: Diagnosis not present

## 2017-01-30 DIAGNOSIS — Z79891 Long term (current) use of opiate analgesic: Secondary | ICD-10-CM | POA: Diagnosis not present

## 2017-01-30 DIAGNOSIS — M1712 Unilateral primary osteoarthritis, left knee: Secondary | ICD-10-CM | POA: Diagnosis not present

## 2017-01-30 DIAGNOSIS — Z96651 Presence of right artificial knee joint: Secondary | ICD-10-CM | POA: Diagnosis not present

## 2017-01-30 DIAGNOSIS — M109 Gout, unspecified: Secondary | ICD-10-CM | POA: Diagnosis not present

## 2017-01-30 DIAGNOSIS — J849 Interstitial pulmonary disease, unspecified: Secondary | ICD-10-CM | POA: Diagnosis not present

## 2017-01-30 DIAGNOSIS — N400 Enlarged prostate without lower urinary tract symptoms: Secondary | ICD-10-CM | POA: Diagnosis not present

## 2017-01-30 DIAGNOSIS — N183 Chronic kidney disease, stage 3 (moderate): Secondary | ICD-10-CM | POA: Diagnosis not present

## 2017-02-04 DIAGNOSIS — Z79891 Long term (current) use of opiate analgesic: Secondary | ICD-10-CM | POA: Diagnosis not present

## 2017-02-04 DIAGNOSIS — M1712 Unilateral primary osteoarthritis, left knee: Secondary | ICD-10-CM | POA: Diagnosis not present

## 2017-02-04 DIAGNOSIS — Z471 Aftercare following joint replacement surgery: Secondary | ICD-10-CM | POA: Diagnosis not present

## 2017-02-04 DIAGNOSIS — Z9981 Dependence on supplemental oxygen: Secondary | ICD-10-CM | POA: Diagnosis not present

## 2017-02-04 DIAGNOSIS — N400 Enlarged prostate without lower urinary tract symptoms: Secondary | ICD-10-CM | POA: Diagnosis not present

## 2017-02-04 DIAGNOSIS — E1122 Type 2 diabetes mellitus with diabetic chronic kidney disease: Secondary | ICD-10-CM | POA: Diagnosis not present

## 2017-02-04 DIAGNOSIS — N183 Chronic kidney disease, stage 3 (moderate): Secondary | ICD-10-CM | POA: Diagnosis not present

## 2017-02-04 DIAGNOSIS — J849 Interstitial pulmonary disease, unspecified: Secondary | ICD-10-CM | POA: Diagnosis not present

## 2017-02-04 DIAGNOSIS — I5032 Chronic diastolic (congestive) heart failure: Secondary | ICD-10-CM | POA: Diagnosis not present

## 2017-02-04 DIAGNOSIS — Z96651 Presence of right artificial knee joint: Secondary | ICD-10-CM | POA: Diagnosis not present

## 2017-02-04 DIAGNOSIS — M109 Gout, unspecified: Secondary | ICD-10-CM | POA: Diagnosis not present

## 2017-02-04 DIAGNOSIS — I130 Hypertensive heart and chronic kidney disease with heart failure and stage 1 through stage 4 chronic kidney disease, or unspecified chronic kidney disease: Secondary | ICD-10-CM | POA: Diagnosis not present

## 2017-02-06 DIAGNOSIS — Z9981 Dependence on supplemental oxygen: Secondary | ICD-10-CM | POA: Diagnosis not present

## 2017-02-06 DIAGNOSIS — E1122 Type 2 diabetes mellitus with diabetic chronic kidney disease: Secondary | ICD-10-CM | POA: Diagnosis not present

## 2017-02-06 DIAGNOSIS — Z471 Aftercare following joint replacement surgery: Secondary | ICD-10-CM | POA: Diagnosis not present

## 2017-02-06 DIAGNOSIS — M1712 Unilateral primary osteoarthritis, left knee: Secondary | ICD-10-CM | POA: Diagnosis not present

## 2017-02-06 DIAGNOSIS — N400 Enlarged prostate without lower urinary tract symptoms: Secondary | ICD-10-CM | POA: Diagnosis not present

## 2017-02-06 DIAGNOSIS — M109 Gout, unspecified: Secondary | ICD-10-CM | POA: Diagnosis not present

## 2017-02-06 DIAGNOSIS — I5032 Chronic diastolic (congestive) heart failure: Secondary | ICD-10-CM | POA: Diagnosis not present

## 2017-02-06 DIAGNOSIS — J849 Interstitial pulmonary disease, unspecified: Secondary | ICD-10-CM | POA: Diagnosis not present

## 2017-02-06 DIAGNOSIS — Z79891 Long term (current) use of opiate analgesic: Secondary | ICD-10-CM | POA: Diagnosis not present

## 2017-02-06 DIAGNOSIS — J8410 Pulmonary fibrosis, unspecified: Secondary | ICD-10-CM | POA: Diagnosis not present

## 2017-02-06 DIAGNOSIS — N183 Chronic kidney disease, stage 3 (moderate): Secondary | ICD-10-CM | POA: Diagnosis not present

## 2017-02-06 DIAGNOSIS — I130 Hypertensive heart and chronic kidney disease with heart failure and stage 1 through stage 4 chronic kidney disease, or unspecified chronic kidney disease: Secondary | ICD-10-CM | POA: Diagnosis not present

## 2017-02-06 DIAGNOSIS — Z96651 Presence of right artificial knee joint: Secondary | ICD-10-CM | POA: Diagnosis not present

## 2017-02-24 DIAGNOSIS — J8410 Pulmonary fibrosis, unspecified: Secondary | ICD-10-CM | POA: Diagnosis not present

## 2017-02-27 ENCOUNTER — Other Ambulatory Visit: Payer: Self-pay | Admitting: Internal Medicine

## 2017-03-04 ENCOUNTER — Other Ambulatory Visit: Payer: Self-pay | Admitting: Internal Medicine

## 2017-03-09 DIAGNOSIS — J8410 Pulmonary fibrosis, unspecified: Secondary | ICD-10-CM | POA: Diagnosis not present

## 2017-03-12 DIAGNOSIS — E7800 Pure hypercholesterolemia, unspecified: Secondary | ICD-10-CM | POA: Diagnosis not present

## 2017-03-12 DIAGNOSIS — B9689 Other specified bacterial agents as the cause of diseases classified elsewhere: Secondary | ICD-10-CM | POA: Diagnosis not present

## 2017-03-12 DIAGNOSIS — E119 Type 2 diabetes mellitus without complications: Secondary | ICD-10-CM | POA: Diagnosis not present

## 2017-03-12 DIAGNOSIS — J069 Acute upper respiratory infection, unspecified: Secondary | ICD-10-CM | POA: Diagnosis not present

## 2017-03-12 DIAGNOSIS — N183 Chronic kidney disease, stage 3 (moderate): Secondary | ICD-10-CM | POA: Diagnosis not present

## 2017-03-12 DIAGNOSIS — M129 Arthropathy, unspecified: Secondary | ICD-10-CM | POA: Diagnosis not present

## 2017-03-12 DIAGNOSIS — Z131 Encounter for screening for diabetes mellitus: Secondary | ICD-10-CM | POA: Diagnosis not present

## 2017-03-12 DIAGNOSIS — R0602 Shortness of breath: Secondary | ICD-10-CM | POA: Diagnosis not present

## 2017-03-27 DIAGNOSIS — J8410 Pulmonary fibrosis, unspecified: Secondary | ICD-10-CM | POA: Diagnosis not present

## 2017-04-03 DIAGNOSIS — I10 Essential (primary) hypertension: Secondary | ICD-10-CM | POA: Diagnosis not present

## 2017-04-03 DIAGNOSIS — E119 Type 2 diabetes mellitus without complications: Secondary | ICD-10-CM | POA: Diagnosis not present

## 2017-04-03 DIAGNOSIS — M109 Gout, unspecified: Secondary | ICD-10-CM | POA: Diagnosis not present

## 2017-04-08 DIAGNOSIS — J8410 Pulmonary fibrosis, unspecified: Secondary | ICD-10-CM | POA: Diagnosis not present

## 2017-04-09 ENCOUNTER — Ambulatory Visit (INDEPENDENT_AMBULATORY_CARE_PROVIDER_SITE_OTHER): Payer: Medicare Other | Admitting: Orthopaedic Surgery

## 2017-04-15 ENCOUNTER — Ambulatory Visit (INDEPENDENT_AMBULATORY_CARE_PROVIDER_SITE_OTHER): Payer: Medicare Other | Admitting: Orthopaedic Surgery

## 2017-04-15 DIAGNOSIS — H2511 Age-related nuclear cataract, right eye: Secondary | ICD-10-CM | POA: Diagnosis not present

## 2017-04-16 DIAGNOSIS — E7800 Pure hypercholesterolemia, unspecified: Secondary | ICD-10-CM | POA: Diagnosis not present

## 2017-04-16 DIAGNOSIS — I10 Essential (primary) hypertension: Secondary | ICD-10-CM | POA: Diagnosis not present

## 2017-04-26 DIAGNOSIS — J8410 Pulmonary fibrosis, unspecified: Secondary | ICD-10-CM | POA: Diagnosis not present

## 2017-05-09 DIAGNOSIS — J8410 Pulmonary fibrosis, unspecified: Secondary | ICD-10-CM | POA: Diagnosis not present

## 2017-05-27 DIAGNOSIS — J8410 Pulmonary fibrosis, unspecified: Secondary | ICD-10-CM | POA: Diagnosis not present

## 2017-06-09 DIAGNOSIS — J8410 Pulmonary fibrosis, unspecified: Secondary | ICD-10-CM | POA: Diagnosis not present

## 2017-06-27 DIAGNOSIS — J8410 Pulmonary fibrosis, unspecified: Secondary | ICD-10-CM | POA: Diagnosis not present

## 2017-07-07 DIAGNOSIS — J8410 Pulmonary fibrosis, unspecified: Secondary | ICD-10-CM | POA: Diagnosis not present

## 2017-07-25 DIAGNOSIS — J8410 Pulmonary fibrosis, unspecified: Secondary | ICD-10-CM | POA: Diagnosis not present

## 2017-08-07 DIAGNOSIS — E1165 Type 2 diabetes mellitus with hyperglycemia: Secondary | ICD-10-CM | POA: Diagnosis not present

## 2017-08-07 DIAGNOSIS — J8410 Pulmonary fibrosis, unspecified: Secondary | ICD-10-CM | POA: Diagnosis not present

## 2017-08-07 DIAGNOSIS — R0602 Shortness of breath: Secondary | ICD-10-CM | POA: Diagnosis not present

## 2017-08-07 DIAGNOSIS — R300 Dysuria: Secondary | ICD-10-CM | POA: Diagnosis not present

## 2017-08-07 DIAGNOSIS — Z79899 Other long term (current) drug therapy: Secondary | ICD-10-CM | POA: Diagnosis not present

## 2017-08-07 DIAGNOSIS — R5383 Other fatigue: Secondary | ICD-10-CM | POA: Diagnosis not present

## 2017-08-07 DIAGNOSIS — D538 Other specified nutritional anemias: Secondary | ICD-10-CM | POA: Diagnosis not present

## 2017-08-07 DIAGNOSIS — D539 Nutritional anemia, unspecified: Secondary | ICD-10-CM | POA: Diagnosis not present

## 2017-08-07 DIAGNOSIS — Z125 Encounter for screening for malignant neoplasm of prostate: Secondary | ICD-10-CM | POA: Diagnosis not present

## 2017-08-07 DIAGNOSIS — E559 Vitamin D deficiency, unspecified: Secondary | ICD-10-CM | POA: Diagnosis not present

## 2017-08-07 DIAGNOSIS — E7800 Pure hypercholesterolemia, unspecified: Secondary | ICD-10-CM | POA: Diagnosis not present

## 2017-08-07 DIAGNOSIS — E291 Testicular hypofunction: Secondary | ICD-10-CM | POA: Diagnosis not present

## 2017-08-07 DIAGNOSIS — M129 Arthropathy, unspecified: Secondary | ICD-10-CM | POA: Diagnosis not present

## 2017-08-25 DIAGNOSIS — J8410 Pulmonary fibrosis, unspecified: Secondary | ICD-10-CM | POA: Diagnosis not present

## 2017-09-06 DIAGNOSIS — J8410 Pulmonary fibrosis, unspecified: Secondary | ICD-10-CM | POA: Diagnosis not present

## 2017-09-11 DIAGNOSIS — K4020 Bilateral inguinal hernia, without obstruction or gangrene, not specified as recurrent: Secondary | ICD-10-CM | POA: Diagnosis not present

## 2017-09-11 DIAGNOSIS — G4733 Obstructive sleep apnea (adult) (pediatric): Secondary | ICD-10-CM | POA: Diagnosis not present

## 2017-09-24 DIAGNOSIS — J8410 Pulmonary fibrosis, unspecified: Secondary | ICD-10-CM | POA: Diagnosis not present

## 2017-09-25 DIAGNOSIS — R001 Bradycardia, unspecified: Secondary | ICD-10-CM | POA: Diagnosis not present

## 2017-09-25 DIAGNOSIS — Z01818 Encounter for other preprocedural examination: Secondary | ICD-10-CM | POA: Diagnosis not present

## 2017-09-25 DIAGNOSIS — K4020 Bilateral inguinal hernia, without obstruction or gangrene, not specified as recurrent: Secondary | ICD-10-CM | POA: Diagnosis not present

## 2017-09-25 DIAGNOSIS — R9431 Abnormal electrocardiogram [ECG] [EKG]: Secondary | ICD-10-CM | POA: Diagnosis not present

## 2017-09-27 DIAGNOSIS — J849 Interstitial pulmonary disease, unspecified: Secondary | ICD-10-CM | POA: Diagnosis not present

## 2017-09-27 DIAGNOSIS — I10 Essential (primary) hypertension: Secondary | ICD-10-CM | POA: Diagnosis not present

## 2017-09-27 DIAGNOSIS — R0902 Hypoxemia: Secondary | ICD-10-CM | POA: Diagnosis not present

## 2017-09-27 DIAGNOSIS — R9431 Abnormal electrocardiogram [ECG] [EKG]: Secondary | ICD-10-CM | POA: Diagnosis not present

## 2017-09-27 DIAGNOSIS — Z01810 Encounter for preprocedural cardiovascular examination: Secondary | ICD-10-CM | POA: Diagnosis not present

## 2017-10-07 DIAGNOSIS — J8410 Pulmonary fibrosis, unspecified: Secondary | ICD-10-CM | POA: Diagnosis not present

## 2017-10-09 DIAGNOSIS — R9431 Abnormal electrocardiogram [ECG] [EKG]: Secondary | ICD-10-CM | POA: Diagnosis not present

## 2017-10-09 DIAGNOSIS — Z01810 Encounter for preprocedural cardiovascular examination: Secondary | ICD-10-CM | POA: Diagnosis not present

## 2017-10-25 DIAGNOSIS — J8410 Pulmonary fibrosis, unspecified: Secondary | ICD-10-CM | POA: Diagnosis not present

## 2017-11-06 DIAGNOSIS — J8410 Pulmonary fibrosis, unspecified: Secondary | ICD-10-CM | POA: Diagnosis not present

## 2017-11-06 DIAGNOSIS — R0902 Hypoxemia: Secondary | ICD-10-CM | POA: Diagnosis not present

## 2017-11-06 DIAGNOSIS — R9431 Abnormal electrocardiogram [ECG] [EKG]: Secondary | ICD-10-CM | POA: Diagnosis not present

## 2017-11-13 ENCOUNTER — Ambulatory Visit: Payer: Medicare Other | Admitting: Internal Medicine

## 2017-11-13 ENCOUNTER — Encounter: Payer: Self-pay | Admitting: Internal Medicine

## 2017-11-13 ENCOUNTER — Ambulatory Visit: Payer: Medicare Other | Admitting: Primary Care

## 2017-11-13 VITALS — BP 130/82 | HR 68 | Ht 65.0 in | Wt 165.0 lb

## 2017-11-13 DIAGNOSIS — Z01811 Encounter for preprocedural respiratory examination: Secondary | ICD-10-CM | POA: Diagnosis not present

## 2017-11-13 DIAGNOSIS — G4733 Obstructive sleep apnea (adult) (pediatric): Secondary | ICD-10-CM

## 2017-11-13 DIAGNOSIS — Z8669 Personal history of other diseases of the nervous system and sense organs: Secondary | ICD-10-CM

## 2017-11-13 DIAGNOSIS — J849 Interstitial pulmonary disease, unspecified: Secondary | ICD-10-CM | POA: Diagnosis not present

## 2017-11-13 NOTE — Progress Notes (Signed)
Subjective:     Patient ID: Stanley Sullivan, male   DOB: [birth year], 77 y.o.   MRN: [id number]  HPI   OV 03/26/2016  Chief Complaint  Patient presents with  . 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 Also has diastolic dysfunction based on echocardiogram in 2014 and a normal cardiac stress test in 2015  Last seen October 2016. At that time I wanted to restage his ILD. Given did not follow-up. He was already on oxygen with exertion. He is here with his wife [name]. Both of them report the I'll be stable. He spends most of the day walking around without much of dyspnea. He hardly realizes oxygen therapy although today when he walked in with 2 L oxygen he promptly desaturated to 85%. With rest bounced back up to 92%. He has desaturation he does not perceive the dyspnea and he does not use his oxygen compliantly. His wife wants a lighter oxygen system for him  His new issues that he wants a preoperative clearance for his right knee replacement. He says he is arthralgia and this limits him as well along with his dyspnea.Cleophas Dunker is a Careers adviser. I got a call from Dr. Marchelle Gearing his cardiologist within the last week or 2 requesting a preoperative pulmonary clearance stating that his cardiac status is stable       COMPARISON:  02/10/2015 and 10/12/2013.  FINDINGS: Cardiovascular: Atherosclerotic calcification of the arterial vasculature, including coronary arteries. Heart is mildly enlarged. No pericardial effusion.  Mediastinum/Nodes: Mediastinal lymph nodes measure up to 1.4 cm in the low right paratracheal station, similar. Hilar regions are difficult to definitively evaluate without IV contrast. No axillary adenopathy. Esophagus is grossly unremarkable.  Lungs/Pleura: A few scattered  pulmonary nodules measure 4 mm or less in size, unchanged and therefore considered benign. There may be minimal scattered subpleural reticular changes without progression. No traction bronchiectasis/bronchiolectasis, ground-glass, architectural distortion or honeycombing. No pleural fluid. Airway is unremarkable.  Upper Abdomen: Visualized portions of the liver and gallbladder are unremarkable. Slight thickening of the right adrenal gl  and. Visualized portions of the left adrenal gland, kidneys, spleen, pancreas and stomach are grossly unremarkable. Duodenal diverticulum is incidentally noted. No upper abdominal adenopathy.  Musculoskeletal: No worrisome lytic or sclerotic lesions. Degenerative changes are seen in the spine.  IMPRESSION: 1. Questionable scattered subpleural reticular changes, unchanged from 02/10/2015. Difficult to exclude nonspecific interstitial pneumonitis. 2. Scattered pulmonary nodules are unchanged and therefore considered benign. 3. Aortic atherosclerosis (ICD10-170.0). Coronary artery calcification.   Electronically Signed   By: Leanna Battles M.D.   On: 03/19/2016 08:57   OV 11/13/2017  Chief Complaint  Patient presents with  . Surgical Clearance    having surgery at [facility], hernia surgery next wednesday 7/24    Stanley Sullivan , 77 y.o. , with dob 22-Oct-1940 and male ,Not Hispanic or Latino from [address] [facility] [place] [address] - presents to lung  clinic with wife Jas for preop pulmonary eval as main issue.He has interstitial lung disease but he has refused biopsy in the past and is dissatisfied using oxygen. He is not kept up with follow-up in over one to 2 years. Last visitas reviewed above was for preoperative pulmonary clearance for knee surgery. He was considered low intermediate risk for  prolonged ventilator dependence. After that he underwent knee surgery successfully. Since then I'm not seen him. He was supposed  to come filed a follow-up but never did. He and his wife do not give clearcut explanations as to why they do not keep him for follow-up. However in the interim he tells me that he is overall stable without any new medical issues he has occasional cough when he takes a cold for but otherwise cough is not an issue. He needs oxygen with exertion. Using 3 L all the time.  We got his oxygen off over 15 minutes and his pulse ox on room air at rest was 95% and walked him on room air and he desaturated to 88 % at 24 meters. I emphasized to him again the need for ILD follow-up. Wife also reports that he has sleep apnea and has not seen a sleep Dr. But he does use his CPAP at night. Review of thepulmonary function test shows last one was in November 2017. Last high-resolution CT chest November 2017.       Results for Stanley Sullivan, Roberth N (MRN [id number]) as of 03/26/2016 16:07  Ref. Range 10/08/2013 09:35 03/20/2016 14:44  FVC-Pre Latest Units: L 2.27 2.55  FVC-%Pred-Pre Latest Units: % 65 76  FEV1-Pre Latest Units: L 1.82 2.00  FEV1-%Pred-Pre Latest Units: % 73 78   Results for Stanley Sullivan, Joesiah N (MRN [id number]) as of 03/26/2016 16:07  Ref. Range 10/08/2013 09:35 03/20/2016 14:44  DLCO unc Latest Units: ml/min/mmHg 6.44 8.32  DLCO unc % pred Latest Units: % 25 32      has a past medical history of Allergy, unspecified not elsewhere classified, Benign prostatic hypertrophy (08/24/2013), Chronic kidney disease, stage III (moderate) (HCC), Chronic respiratory failure with hypoxia (HCC) (12/05/2016), Dyspnea on exertion (04/10/2013), External hemorrhoids without mention of complication, Gout, unspecified, Hypertension, Hypertrophy of prostate without urinary obstruction and other lower urinary tract symptoms (LUTS), Interstitial lung disease (HCC) (10/29/2014), Maculopapular rash, generalized (10/02/2012), Obesity, unspecified, On home oxygen therapy, Organic sleep apnea, unspecified, OSA (obstructive sleep apnea)  (10/09/2013), Osteoarthrosis, unspecified whether generalized or localized, unspecified site, Other abnormal glucose, Other and unspecified hyperlipidemia, Other atopic dermatitis and related conditions, Other specified visual disturbances, Pain in joint, lower leg, Pain in limb, Primary osteoarthritis of right knee (10/29/2014), S/P total knee replacement using cement, 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   reports that he quit smoking about 24 years ago. His smoking use included cigarettes. He has a 7.50 pack-year smoking history. He has quit using smokeless tobacco. His smokeless tobacco use included chew.  Past Surgical History:  Procedure Laterality Date  . CATARACT EXTRACTION    . JOINT REPLACEMENT    . TOTAL KNEE ARTHROPLASTY Right 12/04/2016   Procedure: RIGHT TOTAL KNEE ARTHROPLASTY;  Surgeon: Valeria BatmanWhitfield, Peter W, MD;  Location: [facility] OR;  Service: Orthopedics;  Laterality: Right;    Allergies  Allergen Reactions  . No Known Allergies     Immunization History  Administered Date(s) Administered  . Influenza Split 12/13/2014  . Influenza Whole 02/14/2007, 01/17/2013  . Influenza,inj,Quad PF,6+ Mos 01/29/2014, 12/26/2015  . Pneumococcal Conjugate-13 02/25/2014  . Pneumococcal Polysaccharide-23 02/14/2007  . Td 04/30/2008  . Zoster 09/10/2013    No family history on file.   Current Outpatient Medications:  .  acetaminophen (TYLENOL) 325 MG tablet, Take 650 mg by mouth. Take two tablets (  650 mg) every 6 hours as needed for pain, Disp: , Rfl:  .  albuterol (PROVENTIL HFA;VENTOLIN HFA) 108 (90 Base) MCG/ACT inhaler, Inhale 2 puffs into the lungs. Into lungs every 6 hours as needed for wheezing or shortness of breath or cough, Disp: , Rfl:  .  allopurinol (ZYLOPRIM) 100 MG tablet, Take 1  tablet (100 mg total) by mouth daily. For gout, Disp: 90 tablet, Rfl: 1 .  aspirin 325 MG tablet, Take 1 tablet (325 mg total) by mouth 2 (two) times daily after a meal., Disp: , Rfl:  .  atorvastatin (LIPITOR) 80 MG tablet, TAKE ONE TABLET BY MOUTH ONE TIME DAILY , Disp: 30 tablet, Rfl: 6 .  CALCIUM PO, Take 500 mg by mouth daily. , Disp: , Rfl:  .  cetirizine (KLS ALLER-TEC) 10 MG tablet, Take 10 mg by mouth daily as needed (for allergies.). , Disp: , Rfl:  .  cholecalciferol (VITAMIN D) 1000 units tablet, Take 1,000 Units by mouth daily., Disp: , Rfl:  .  Cyanocobalamin (VITAMIN B-12) 2500 MCG SUBL, Place 2,500 mcg under the tongue daily., Disp: , Rfl:  .  furosemide (LASIX) 20 MG tablet, Take 1 tablet (20 mg total) by mouth daily. For swelling of legs, Disp: 90 tablet, Rfl: 1 .  HYDROcodone-acetaminophen (NORCO/VICODIN) 5-325 MG tablet, Take 1 tablet by mouth every 6 (six) hours as needed for moderate pain., Disp: 30 tablet, Rfl: 0 .  losartan (COZAAR) 100 MG tablet, TAKE ONE TABLET BY MOUTH ONE TIME DAILY , Disp: 30 tablet, Rfl: 6 .  methocarbamol (ROBAXIN) 500 MG tablet, Take 500 mg by mouth 3 (three) times daily as needed. Take one tablet three times daily for muscle spasms , Disp: , Rfl:  .  metoprolol succinate (TOPROL-XL) 100 MG 24 hr tablet, TAKE 1 TABLET (100 MG TOTAL) BY MOUTH DAILY., Disp: 30 tablet, Rfl: 4 .  polyvinyl alcohol (LUBRICANT DROPS) 1.4 % ophthalmic solution, Place 1 drop into both eyes 3 (three) times daily as needed for dry eyes., Disp: , Rfl:  .  tamsulosin (FLOMAX) 0.4 MG CAPS capsule, TAKE 1 CAPSULE BY MOUTH ONCE A DAY FOR YOUR PROSTATE, Disp: 90 capsule, Rfl: 1   Review of Systems     Objective:   Physical Exam Today's Vitals   11/13/17 1024  BP: 130/82  Pulse: 68  SpO2: 90%  Weight: 165 lb (74.8 kg)  Height: 5\' 5"  (1.651 m)    Estimated body mass index is 27.46 kg/m as calculated from the following:   Height as of this encounter: 5\' 5"  (1.651 m).    Weight as of this encounter: 165 lb (74.8 kg).     Assessment:       ICD-10-CM   1. Preoperative respiratory examination Z01.811   2. History of obstructive sleep apnea Z86.69   3. ILD (interstitial lung disease) (HCC) J84.9        [name] Postperative Pulmonary Risk Score Comment Score  Type of surgery - abd ao aneurysm (27), thoracic (21), neurosurgery / upper abdominal / vascular (21), neck (11) Inguinal hernia 0  Emergency Surgery - (11) elective 0  ALbumin < 3 or poor nutritional state - (9) Clinically normal 0  BUN > 30 -  (8) Clinically normal, bun 21 in augu 2018 0  Partial or completely dependent functional status - (7) fyunctiona 0  COPD -  (6) ILD 7  Age - 60 to 23 (4), > 70  (6) Age 88 6  TOTAL  12  Risk Stratifcation scores  - < 10, 11-19, 20-27, 28-40, >40  Low-moderate     CANET Postperative Pulmonary Risk Score Comment Score  Age - <50 (0), 50-80 (3), >80 (16) 77 years 3  Preoperative pulse ox - >96 (0), 91-95 (8), <90 (24) On room air rest 24  Respiratory infection in last month - Yes (17) none 0  Preoperative anemia - < 10gm% - Yes (11) 11.3 in 2018 0  Surgical incision - Upper abdominal (15), Thoracic (24) inguinal 0  Duration of surgery - <2h (0), 2-3h (16), >3h (23) 2h per family 0  Emergency Surgery - Yes (8) elective 0  TOTAL  27  Risk Stratification - Low (<26), Intermediate (26-44), High (>45)  intermediate      Plan:     Preoperative respiratory examination  - low moderate risk for prolonged ventilator dependence of hernia surgery - moderate risk for any complication pulmonary after hernia surgery - okay to undergo hernia surgery - We'll definitely need postoperative CPAP and early ambulation and pulmonary toileting and nebulizers and DVT prophylaxis in the postoperative phase - Do not do surgery typical viral respiratory infection between now and the surgery date   History of obstructive sleep apnea  - referred to one of our sleep  doctors  ILD (interstitial lung disease) (HCC) - this still remains uncharacterized - Very important we get to the bottom of this as I have discussed before - continue o2 as before - Please do spirometry and diffusion capacity test in 2 months - Please do high resolution CT chest supine and prone in 2 months  Follow-up - Return to see Dr. Marchelle Gearing mildly clinic in 2 months but after testing      Dr. Kalman Shan, M.D., [facility].C.P Pulmonary and Critical Care Medicine Staff Physician, [facility] Director - Interstitial Lung Disease  Program  Pulmonary Fibrosis [facility] at [facility] [place], [place], [address]  Pager: [phone], If no answer or between  15:00h - 7:00h: call [phone] Telephone: [phone]

## 2017-11-13 NOTE — Patient Instructions (Addendum)
Preoperative respiratory examination  - low moderate risk for prolonged ventilator dependence of hernia surgery - moderate risk for any complication pulmonary after hernia surgery - okay to undergo hernia surgery - We'll definitely need postoperative CPAP and early ambulation and pulmonary toileting and nebulizers and DVT prophylaxis in the postoperative phase - Do not do surgery typical viral respiratory infection between now and the surgery date   History of obstructive sleep apnea  - referred to one of our sleep doctors  ILD (interstitial lung disease) (HCC) - this still remains uncharacterized - Very important we get to the bottom of this as I have discussed before - continue o2 as before - Please do spirometry and diffusion capacity test in 2 months - Please do high resolution CT chest supine and prone in 2 months  Follow-up - Return to see Dr. Marchelle Gearingamaswamy mildly clinic in 2 months but after testing

## 2017-11-13 NOTE — Addendum Note (Signed)
Addended by: Cydney OkAUGUSTIN, Patricia Perales N on: 11/13/2017 11:11 AM   Modules accepted: Orders

## 2017-11-19 DIAGNOSIS — R9431 Abnormal electrocardiogram [ECG] [EKG]: Secondary | ICD-10-CM | POA: Diagnosis not present

## 2017-11-19 DIAGNOSIS — R001 Bradycardia, unspecified: Secondary | ICD-10-CM | POA: Diagnosis not present

## 2017-11-19 DIAGNOSIS — E119 Type 2 diabetes mellitus without complications: Secondary | ICD-10-CM | POA: Diagnosis not present

## 2017-11-19 DIAGNOSIS — G4730 Sleep apnea, unspecified: Secondary | ICD-10-CM | POA: Diagnosis not present

## 2017-11-19 DIAGNOSIS — K4020 Bilateral inguinal hernia, without obstruction or gangrene, not specified as recurrent: Secondary | ICD-10-CM | POA: Diagnosis not present

## 2017-11-24 DIAGNOSIS — J8410 Pulmonary fibrosis, unspecified: Secondary | ICD-10-CM | POA: Diagnosis not present

## 2017-12-07 DIAGNOSIS — J8410 Pulmonary fibrosis, unspecified: Secondary | ICD-10-CM | POA: Diagnosis not present

## 2017-12-25 ENCOUNTER — Ambulatory Visit: Payer: Medicare Other | Admitting: Pulmonary Disease

## 2017-12-25 DIAGNOSIS — J8410 Pulmonary fibrosis, unspecified: Secondary | ICD-10-CM | POA: Diagnosis not present

## 2018-01-07 DIAGNOSIS — J8410 Pulmonary fibrosis, unspecified: Secondary | ICD-10-CM | POA: Diagnosis not present

## 2018-01-14 ENCOUNTER — Inpatient Hospital Stay: Admission: RE | Admit: 2018-01-14 | Payer: Medicare Other | Source: Ambulatory Visit

## 2018-01-17 ENCOUNTER — Telehealth: Payer: Self-pay | Admitting: Internal Medicine

## 2018-01-17 NOTE — Telephone Encounter (Signed)
Called pt and spoke with pt's spouse. [place] from [place] with [facility] stating that pt's CT had to be rescheduled due to [place].  Pt was scheduled for an OV with MR Monday, 9/23 but due to pt currently still being out of the country, OV has been cancelled.  Stated to pt's spouse to call once the CT has been completed so we can reschedule the OV with the 30min PFT prior.  Sent stacey a message letting her know pt was arriving back home 9/26. Nothing further needed.

## 2018-01-20 ENCOUNTER — Ambulatory Visit: Payer: Medicare Other | Admitting: Internal Medicine

## 2018-01-25 DIAGNOSIS — J8410 Pulmonary fibrosis, unspecified: Secondary | ICD-10-CM | POA: Diagnosis not present

## 2018-02-06 DIAGNOSIS — J8410 Pulmonary fibrosis, unspecified: Secondary | ICD-10-CM | POA: Diagnosis not present

## 2018-02-13 DIAGNOSIS — J209 Acute bronchitis, unspecified: Secondary | ICD-10-CM | POA: Diagnosis not present

## 2018-02-20 ENCOUNTER — Inpatient Hospital Stay: Admission: RE | Admit: 2018-02-20 | Payer: Medicare Other | Source: Ambulatory Visit

## 2018-02-24 DIAGNOSIS — J8410 Pulmonary fibrosis, unspecified: Secondary | ICD-10-CM | POA: Diagnosis not present

## 2018-03-09 DIAGNOSIS — J8410 Pulmonary fibrosis, unspecified: Secondary | ICD-10-CM | POA: Diagnosis not present

## 2018-03-25 DIAGNOSIS — Z79899 Other long term (current) drug therapy: Secondary | ICD-10-CM | POA: Diagnosis not present

## 2018-03-25 DIAGNOSIS — M25512 Pain in left shoulder: Secondary | ICD-10-CM | POA: Diagnosis not present

## 2018-03-25 DIAGNOSIS — R5383 Other fatigue: Secondary | ICD-10-CM | POA: Diagnosis not present

## 2018-03-25 DIAGNOSIS — Z23 Encounter for immunization: Secondary | ICD-10-CM | POA: Diagnosis not present

## 2018-03-25 DIAGNOSIS — E7800 Pure hypercholesterolemia, unspecified: Secondary | ICD-10-CM | POA: Diagnosis not present

## 2018-03-25 DIAGNOSIS — R300 Dysuria: Secondary | ICD-10-CM | POA: Diagnosis not present

## 2018-03-25 DIAGNOSIS — E559 Vitamin D deficiency, unspecified: Secondary | ICD-10-CM | POA: Diagnosis not present

## 2018-03-25 DIAGNOSIS — E1165 Type 2 diabetes mellitus with hyperglycemia: Secondary | ICD-10-CM | POA: Diagnosis not present

## 2018-03-27 DIAGNOSIS — J8410 Pulmonary fibrosis, unspecified: Secondary | ICD-10-CM | POA: Diagnosis not present

## 2018-03-31 DIAGNOSIS — E1165 Type 2 diabetes mellitus with hyperglycemia: Secondary | ICD-10-CM | POA: Diagnosis not present

## 2018-03-31 DIAGNOSIS — G4733 Obstructive sleep apnea (adult) (pediatric): Secondary | ICD-10-CM | POA: Diagnosis not present

## 2018-03-31 DIAGNOSIS — I10 Essential (primary) hypertension: Secondary | ICD-10-CM | POA: Diagnosis not present

## 2018-03-31 DIAGNOSIS — J449 Chronic obstructive pulmonary disease, unspecified: Secondary | ICD-10-CM | POA: Diagnosis not present

## 2018-04-02 DIAGNOSIS — J449 Chronic obstructive pulmonary disease, unspecified: Secondary | ICD-10-CM | POA: Diagnosis not present

## 2018-04-08 DIAGNOSIS — J8410 Pulmonary fibrosis, unspecified: Secondary | ICD-10-CM | POA: Diagnosis not present

## 2018-04-26 DIAGNOSIS — J8410 Pulmonary fibrosis, unspecified: Secondary | ICD-10-CM | POA: Diagnosis not present

## 2018-05-09 DIAGNOSIS — J8410 Pulmonary fibrosis, unspecified: Secondary | ICD-10-CM | POA: Diagnosis not present

## 2018-05-15 DIAGNOSIS — K5909 Other constipation: Secondary | ICD-10-CM | POA: Diagnosis not present

## 2018-05-15 DIAGNOSIS — J209 Acute bronchitis, unspecified: Secondary | ICD-10-CM | POA: Diagnosis not present

## 2018-05-15 DIAGNOSIS — G4733 Obstructive sleep apnea (adult) (pediatric): Secondary | ICD-10-CM | POA: Diagnosis not present

## 2018-05-15 DIAGNOSIS — Z0000 Encounter for general adult medical examination without abnormal findings: Secondary | ICD-10-CM | POA: Diagnosis not present

## 2018-05-15 DIAGNOSIS — I10 Essential (primary) hypertension: Secondary | ICD-10-CM | POA: Diagnosis not present

## 2018-05-23 DIAGNOSIS — I10 Essential (primary) hypertension: Secondary | ICD-10-CM | POA: Diagnosis not present

## 2018-05-23 DIAGNOSIS — G4733 Obstructive sleep apnea (adult) (pediatric): Secondary | ICD-10-CM | POA: Diagnosis not present

## 2018-05-23 DIAGNOSIS — J449 Chronic obstructive pulmonary disease, unspecified: Secondary | ICD-10-CM | POA: Diagnosis not present

## 2018-05-27 DIAGNOSIS — J8410 Pulmonary fibrosis, unspecified: Secondary | ICD-10-CM | POA: Diagnosis not present

## 2018-06-09 DIAGNOSIS — J8410 Pulmonary fibrosis, unspecified: Secondary | ICD-10-CM | POA: Diagnosis not present

## 2018-06-09 DIAGNOSIS — G4733 Obstructive sleep apnea (adult) (pediatric): Secondary | ICD-10-CM | POA: Diagnosis not present

## 2018-06-27 DIAGNOSIS — J8410 Pulmonary fibrosis, unspecified: Secondary | ICD-10-CM | POA: Diagnosis not present

## 2018-07-08 DIAGNOSIS — J8410 Pulmonary fibrosis, unspecified: Secondary | ICD-10-CM | POA: Diagnosis not present

## 2018-07-08 DIAGNOSIS — G4733 Obstructive sleep apnea (adult) (pediatric): Secondary | ICD-10-CM | POA: Diagnosis not present

## 2018-07-26 DIAGNOSIS — J8410 Pulmonary fibrosis, unspecified: Secondary | ICD-10-CM | POA: Diagnosis not present

## 2018-08-08 DIAGNOSIS — G4733 Obstructive sleep apnea (adult) (pediatric): Secondary | ICD-10-CM | POA: Diagnosis not present

## 2018-08-08 DIAGNOSIS — J8410 Pulmonary fibrosis, unspecified: Secondary | ICD-10-CM | POA: Diagnosis not present

## 2018-08-12 DIAGNOSIS — J449 Chronic obstructive pulmonary disease, unspecified: Secondary | ICD-10-CM | POA: Diagnosis not present

## 2018-08-12 DIAGNOSIS — R5383 Other fatigue: Secondary | ICD-10-CM | POA: Diagnosis not present

## 2018-08-12 DIAGNOSIS — R300 Dysuria: Secondary | ICD-10-CM | POA: Diagnosis not present

## 2018-08-12 DIAGNOSIS — M129 Arthropathy, unspecified: Secondary | ICD-10-CM | POA: Diagnosis not present

## 2018-08-12 DIAGNOSIS — R0902 Hypoxemia: Secondary | ICD-10-CM | POA: Diagnosis not present

## 2018-08-12 DIAGNOSIS — E7800 Pure hypercholesterolemia, unspecified: Secondary | ICD-10-CM | POA: Diagnosis not present

## 2018-08-12 DIAGNOSIS — M5440 Lumbago with sciatica, unspecified side: Secondary | ICD-10-CM | POA: Diagnosis not present

## 2018-08-12 DIAGNOSIS — D539 Nutritional anemia, unspecified: Secondary | ICD-10-CM | POA: Diagnosis not present

## 2018-08-12 DIAGNOSIS — E559 Vitamin D deficiency, unspecified: Secondary | ICD-10-CM | POA: Diagnosis not present

## 2018-08-12 DIAGNOSIS — M545 Low back pain: Secondary | ICD-10-CM | POA: Diagnosis not present

## 2018-08-15 ENCOUNTER — Encounter: Payer: Self-pay | Admitting: Cardiology

## 2018-08-15 DIAGNOSIS — M545 Low back pain: Secondary | ICD-10-CM | POA: Diagnosis not present

## 2018-08-15 DIAGNOSIS — R918 Other nonspecific abnormal finding of lung field: Secondary | ICD-10-CM | POA: Diagnosis not present

## 2018-08-19 DIAGNOSIS — E1165 Type 2 diabetes mellitus with hyperglycemia: Secondary | ICD-10-CM | POA: Diagnosis not present

## 2018-08-19 DIAGNOSIS — I10 Essential (primary) hypertension: Secondary | ICD-10-CM | POA: Diagnosis not present

## 2018-08-19 DIAGNOSIS — J449 Chronic obstructive pulmonary disease, unspecified: Secondary | ICD-10-CM | POA: Diagnosis not present

## 2018-08-19 DIAGNOSIS — E7800 Pure hypercholesterolemia, unspecified: Secondary | ICD-10-CM | POA: Diagnosis not present

## 2018-08-26 DIAGNOSIS — J8410 Pulmonary fibrosis, unspecified: Secondary | ICD-10-CM | POA: Diagnosis not present

## 2018-09-07 DIAGNOSIS — G4733 Obstructive sleep apnea (adult) (pediatric): Secondary | ICD-10-CM | POA: Diagnosis not present

## 2018-09-07 DIAGNOSIS — J8410 Pulmonary fibrosis, unspecified: Secondary | ICD-10-CM | POA: Diagnosis not present

## 2018-09-18 DIAGNOSIS — G4733 Obstructive sleep apnea (adult) (pediatric): Secondary | ICD-10-CM | POA: Diagnosis not present

## 2018-09-25 DIAGNOSIS — J8410 Pulmonary fibrosis, unspecified: Secondary | ICD-10-CM | POA: Diagnosis not present

## 2018-10-08 DIAGNOSIS — G4733 Obstructive sleep apnea (adult) (pediatric): Secondary | ICD-10-CM | POA: Diagnosis not present

## 2018-10-08 DIAGNOSIS — J8410 Pulmonary fibrosis, unspecified: Secondary | ICD-10-CM | POA: Diagnosis not present

## 2018-10-26 DIAGNOSIS — J8410 Pulmonary fibrosis, unspecified: Secondary | ICD-10-CM | POA: Diagnosis not present

## 2018-11-02 ENCOUNTER — Emergency Department (HOSPITAL_COMMUNITY): Payer: Medicare Other

## 2018-11-02 ENCOUNTER — Other Ambulatory Visit: Payer: Self-pay

## 2018-11-02 ENCOUNTER — Observation Stay (HOSPITAL_COMMUNITY)
Admission: EM | Admit: 2018-11-02 | Discharge: 2018-11-03 | Disposition: A | Discharge status: Home / Self Care | Payer: Medicare Other | Attending: Family Medicine | Admitting: Family Medicine

## 2018-11-02 ENCOUNTER — Encounter (HOSPITAL_COMMUNITY): Payer: Self-pay | Admitting: Emergency Medicine

## 2018-11-02 DIAGNOSIS — E1122 Type 2 diabetes mellitus with diabetic chronic kidney disease: Secondary | ICD-10-CM | POA: Insufficient documentation

## 2018-11-02 DIAGNOSIS — G4733 Obstructive sleep apnea (adult) (pediatric): Secondary | ICD-10-CM | POA: Diagnosis present

## 2018-11-02 DIAGNOSIS — I499 Cardiac arrhythmia, unspecified: Secondary | ICD-10-CM | POA: Diagnosis not present

## 2018-11-02 DIAGNOSIS — J849 Interstitial pulmonary disease, unspecified: Secondary | ICD-10-CM | POA: Diagnosis not present

## 2018-11-02 DIAGNOSIS — Z7982 Long term (current) use of aspirin: Secondary | ICD-10-CM | POA: Diagnosis not present

## 2018-11-02 DIAGNOSIS — J329 Chronic sinusitis, unspecified: Secondary | ICD-10-CM | POA: Diagnosis not present

## 2018-11-02 DIAGNOSIS — R9431 Abnormal electrocardiogram [ECG] [EKG]: Secondary | ICD-10-CM | POA: Diagnosis not present

## 2018-11-02 DIAGNOSIS — I4430 Unspecified atrioventricular block: Secondary | ICD-10-CM | POA: Diagnosis not present

## 2018-11-02 DIAGNOSIS — I10 Essential (primary) hypertension: Secondary | ICD-10-CM | POA: Diagnosis present

## 2018-11-02 DIAGNOSIS — Z96653 Presence of artificial knee joint, bilateral: Secondary | ICD-10-CM | POA: Insufficient documentation

## 2018-11-02 DIAGNOSIS — Z03818 Encounter for observation for suspected exposure to other biological agents ruled out: Secondary | ICD-10-CM | POA: Insufficient documentation

## 2018-11-02 DIAGNOSIS — R52 Pain, unspecified: Secondary | ICD-10-CM | POA: Diagnosis not present

## 2018-11-02 DIAGNOSIS — I129 Hypertensive chronic kidney disease with stage 1 through stage 4 chronic kidney disease, or unspecified chronic kidney disease: Secondary | ICD-10-CM | POA: Insufficient documentation

## 2018-11-02 DIAGNOSIS — N183 Chronic kidney disease, stage 3 (moderate): Secondary | ICD-10-CM | POA: Insufficient documentation

## 2018-11-02 DIAGNOSIS — Z79899 Other long term (current) drug therapy: Secondary | ICD-10-CM | POA: Diagnosis not present

## 2018-11-02 DIAGNOSIS — S0990XA Unspecified injury of head, initial encounter: Secondary | ICD-10-CM

## 2018-11-02 DIAGNOSIS — S0003XA Contusion of scalp, initial encounter: Secondary | ICD-10-CM | POA: Diagnosis not present

## 2018-11-02 DIAGNOSIS — M47812 Spondylosis without myelopathy or radiculopathy, cervical region: Secondary | ICD-10-CM | POA: Diagnosis not present

## 2018-11-02 DIAGNOSIS — S299XXA Unspecified injury of thorax, initial encounter: Secondary | ICD-10-CM | POA: Diagnosis not present

## 2018-11-02 DIAGNOSIS — Z87891 Personal history of nicotine dependence: Secondary | ICD-10-CM | POA: Insufficient documentation

## 2018-11-02 DIAGNOSIS — R55 Syncope and collapse: Principal | ICD-10-CM | POA: Diagnosis present

## 2018-11-02 DIAGNOSIS — R0602 Shortness of breath: Secondary | ICD-10-CM | POA: Diagnosis not present

## 2018-11-02 DIAGNOSIS — R51 Headache: Secondary | ICD-10-CM | POA: Diagnosis not present

## 2018-11-02 DIAGNOSIS — I440 Atrioventricular block, first degree: Secondary | ICD-10-CM | POA: Diagnosis not present

## 2018-11-02 DIAGNOSIS — E1129 Type 2 diabetes mellitus with other diabetic kidney complication: Secondary | ICD-10-CM | POA: Diagnosis present

## 2018-11-02 LAB — URINALYSIS, ROUTINE W REFLEX MICROSCOPIC
Bacteria, UA: NONE SEEN
Bilirubin Urine: NEGATIVE
Glucose, UA: NEGATIVE mg/dL
Hgb urine dipstick: NEGATIVE
Ketones, ur: NEGATIVE mg/dL
Leukocytes,Ua: NEGATIVE
Nitrite: NEGATIVE
Protein, ur: 100 mg/dL — AB
Specific Gravity, Urine: 1.013 (ref 1.005–1.030)
pH: 6 (ref 5.0–8.0)

## 2018-11-02 LAB — BASIC METABOLIC PANEL
Anion gap: 7 (ref 5–15)
BUN: 23 mg/dL (ref 8–23)
CO2: 24 mmol/L (ref 22–32)
Calcium: 8.7 mg/dL — ABNORMAL LOW (ref 8.9–10.3)
Chloride: 111 mmol/L (ref 98–111)
Creatinine, Ser: 2 mg/dL — ABNORMAL HIGH (ref 0.61–1.24)
GFR calc Af Amer: 36 mL/min — ABNORMAL LOW (ref >60)
GFR calc non Af Amer: 31 mL/min — ABNORMAL LOW (ref >60)
Glucose, Bld: 116 mg/dL — ABNORMAL HIGH (ref 70–99)
Potassium: 4.4 mmol/L (ref 3.5–5.1)
Sodium: 142 mmol/L (ref 135–145)

## 2018-11-02 LAB — CBC
HCT: 48.5 % (ref 39.0–52.0)
Hemoglobin: 15.1 g/dL (ref 13.0–17.0)
MCH: 23.8 pg — ABNORMAL LOW (ref 26.0–34.0)
MCHC: 31.1 g/dL (ref 30.0–36.0)
MCV: 76.4 fL — ABNORMAL LOW (ref 80.0–100.0)
Platelets: 163 10*3/uL (ref 150–400)
RBC: 6.35 MIL/uL — ABNORMAL HIGH (ref 4.22–5.81)
RDW: 17.4 % — ABNORMAL HIGH (ref 11.5–15.5)
WBC: 7.8 10*3/uL (ref 4.0–10.5)
nRBC: 0 % (ref 0.0–0.2)

## 2018-11-02 LAB — TROPONIN I (HIGH SENSITIVITY): Troponin I (High Sensitivity): 13 ng/L (ref <18)

## 2018-11-02 LAB — CBG MONITORING, ED: Glucose-Capillary: 128 mg/dL — ABNORMAL HIGH (ref 70–99)

## 2018-11-02 MED ORDER — SODIUM CHLORIDE 0.9% FLUSH
3.0000 mL | Freq: Once | INTRAVENOUS | Status: AC
  Administered 2018-11-03: 07:00:00 3 mL via INTRAVENOUS

## 2018-11-02 NOTE — ED Notes (Signed)
Pt to CT

## 2018-11-02 NOTE — ED Provider Notes (Signed)
[facility] Provider Note   CSN: [id number] Arrival date & time: 11/02/18  2156    History   Chief Complaint Chief Complaint  Patient presents with   Fall   Loss of Consciousness    HPI Lion Celene Skeen Dassow is a 78 y.o. male.     The history is provided by the patient.  Loss of Consciousness Episode history:  Single Most recent episode:  Today Progression:  Resolved Chronicity:  New Context: normal activity   Context comment:  Patient had syncopal episode while ambulating.  Patient usually on 3 L of oxygen and was not wearing it for several minutes while walking around.  He put oxygen back on and then shortly passed out.  Hit the left side of his head.  Has been ambulatory.   Witnessed: yes   Relieved by:  Nothing Worsened by:  Nothing Associated symptoms: headaches   Associated symptoms: no chest pain, no fever, no palpitations, no seizures, no shortness of breath and no vomiting   Risk factors comment:  COPD on 3L   Past Medical History:  Diagnosis Date   Allergy, unspecified not elsewhere classified    Benign prostatic hypertrophy 08/24/2013   Chronic kidney disease, stage III (moderate) (HCC)    patient denies   Chronic respiratory failure with hypoxia (HCC) 12/05/2016   Dyspnea on exertion 04/10/2013   External hemorrhoids without mention of complication    Gout, unspecified    Hypertension    Hypertrophy of prostate without urinary obstruction and other lower urinary tract symptoms (LUTS)    Interstitial lung disease (HCC) 10/29/2014   Maculopapular rash, generalized 10/02/2012   Obesity, unspecified    On home oxygen therapy    in the morning and evening, with exertion, as needed when O2 drops to 80s   Organic sleep apnea, unspecified    CPAP, presure setting 3   OSA (obstructive sleep apnea) 10/09/2013   Osteoarthrosis, unspecified whether generalized or localized, unspecified site    Other abnormal glucose     Other and unspecified hyperlipidemia    Other atopic dermatitis and related conditions    Other specified visual disturbances    Pain in joint, lower leg    Pain in limb    Primary osteoarthritis of right knee 10/29/2014   S/P total knee replacement using cement, 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   Type II or unspecified type diabetes mellitus without mention of complication, not stated as uncontrolled 08/24/2013   Unspecified disorder of kidney and ureter    Unspecified essential hypertension    Urticaria, unspecified     Patient Active Problem List   Diagnosis Date Noted   Thrombocytopenia (HCC) 12/08/2016   B12 deficiency 12/08/2016   Hypertension 12/05/2016   Acute renal failure superimposed on stage 3 chronic kidney disease (HCC) 12/05/2016   Chronic respiratory failure with hypoxia (HCC) 12/05/2016   Chronic diastolic heart failure, NYHA class 2 (HCC) 12/05/2016   Acute hyperkalemia 12/05/2016   Hypocalcemia 12/05/2016   AKI (acute kidney injury) (HCC)    S/P total knee replacement using cement, 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   Interstitial lung disease (HCC) 10/29/2014   Primary osteoarthritis of right knee 10/29/2014   DM (diabetes mellitus) type II controlled with renal manifestation (HCC) 10/29/2014   OSA (obstructive sleep apnea) 10/09/2013   ILD (interstitial lung disease) (HCC) 10/09/2013   Cough 10/07/2013   Dyspnea 10/07/2013   Osteoarthritis of left knee 08/24/2013   Benign prostatic hypertrophy 08/24/2013   Essential hypertension, benign 08/24/2013  Gout 08/24/2013   Type II or unspecified type diabetes mellitus without mention of complication, not stated as uncontrolled 08/24/2013   Chronic idiopathic constipation 04/10/2013   Dyspnea on exertion 04/10/2013   Osteoarthrosis, unspecified whether generalized or localized, unspecified site    Chronic kidney disease, stage III (moderate) (HCC)    Obesity, unspecified    Gout, unspecified     Hyperlipidemia    Essential hypertension    BPH without obstruction/lower urinary tract symptoms    Maculopapular rash, generalized 10/02/2012    Past Surgical History:  Procedure Laterality Date   CATARACT EXTRACTION     JOINT REPLACEMENT     TOTAL KNEE ARTHROPLASTY Right 12/04/2016   Procedure: RIGHT TOTAL KNEE ARTHROPLASTY;  Surgeon: Valeria Batman, MD;  Location: [facility] OR;  Service: Orthopedics;  Laterality: Right;        Home Medications    Prior to Admission medications   Medication Sig Start Date End Date Taking? Authorizing Provider  acetaminophen (TYLENOL) 325 MG tablet Take 650 mg by mouth. Take two tablets (650 mg) every 6 hours as needed for pain    [provider]  albuterol (PROVENTIL HFA;VENTOLIN HFA) 108 (90 Base) MCG/ACT inhaler Inhale 2 puffs into the lungs. Into lungs every 6 hours as needed for wheezing or shortness of breath or cough    [provider]  allopurinol (ZYLOPRIM) 100 MG tablet Take 1 tablet (100 mg total) by mouth daily. For gout 10/13/15   Renato Gails, Tiffany L, DO  aspirin 325 MG tablet Take 1 tablet (325 mg total) by mouth 2 (two) times daily after a meal. 12/06/16   Petrarca, Oris Drone, PA-C  atorvastatin (LIPITOR) 80 MG tablet TAKE ONE TABLET BY MOUTH ONE TIME DAILY  03/04/17   Kirt Boys, DO  CALCIUM PO Take 500 mg by mouth daily.     [provider]  cetirizine (KLS ALLER-TEC) 10 MG tablet Take 10 mg by mouth daily as needed (for allergies.).     [provider]  cholecalciferol (VITAMIN D) 1000 units tablet Take 1,000 Units by mouth daily.    [provider]  Cyanocobalamin (VITAMIN B-12) 2500 MCG SUBL Place 2,500 mcg under the tongue daily.    [provider]  furosemide (LASIX) 20 MG tablet Take 1 tablet (20 mg total) by mouth daily. For swelling of legs 07/18/15   Reed, Tiffany L, DO  HYDROcodone-acetaminophen (NORCO/VICODIN) 5-325 MG tablet Take 1 tablet by mouth every 6 (six) hours as  needed for moderate pain. 12/06/16   Jetty Peeks, PA-C  losartan (COZAAR) 100 MG tablet TAKE ONE TABLET BY MOUTH ONE TIME DAILY  03/04/17   Kirt Boys, DO  methocarbamol (ROBAXIN) 500 MG tablet Take 500 mg by mouth 3 (three) times daily as needed. Take one tablet three times daily for muscle spasms     [provider]  metoprolol succinate (TOPROL-XL) 100 MG 24 hr tablet TAKE 1 TABLET (100 MG TOTAL) BY MOUTH DAILY. 06/25/16   Reed, Tiffany L, DO  polyvinyl alcohol (LUBRICANT DROPS) 1.4 % ophthalmic solution Place 1 drop into both eyes 3 (three) times daily as needed for dry eyes.    [provider]  tamsulosin (FLOMAX) 0.4 MG CAPS capsule TAKE 1 CAPSULE BY MOUTH ONCE A DAY FOR YOUR PROSTATE 04/16/16   Kermit Balo, DO    Family History No family history on file.  Social History Social History   Tobacco Use   Smoking status: Former Smoker  Packs/day: 0.50    Years: 15.00    Pack years: 7.50    Types: Cigarettes    Quit date: 04/30/1993    Years since quitting: 25.5   Smokeless tobacco: Former Neurosurgeon    Types: Chew  Substance Use Topics   Alcohol use: Yes    Comment: occassional   Drug use: No     Allergies   No known allergies   Review of Systems Review of Systems  Constitutional: Negative for chills and fever.  HENT: Negative for ear pain and sore throat.   Eyes: Negative for pain and visual disturbance.  Respiratory: Negative for cough and shortness of breath.   Cardiovascular: Positive for syncope. Negative for chest pain and palpitations.  Gastrointestinal: Negative for abdominal pain and vomiting.  Genitourinary: Negative for dysuria and hematuria.  Musculoskeletal: Negative for arthralgias and back pain.  Skin: Negative for color change and rash.  Neurological: Positive for headaches. Negative for seizures and syncope.  All other systems reviewed and are negative.    Physical Exam Updated Vital Signs  ED Triage Vitals  Enc  Vitals Group     BP 11/02/18 2200 (!) 185/93     Pulse Rate 11/02/18 2200 (!) 51     Resp 11/02/18 2200 16     Temp 11/02/18 2200 97.8 F (36.6 C)     Temp Source 11/02/18 2200 Oral     SpO2 11/02/18 2200 96 %     Weight --      Height --      Head Circumference --      Peak Flow --      Pain Score 11/02/18 2204 5     Pain Loc --      Pain Edu? --      Excl. in GC? --     Physical Exam Vitals signs and nursing note reviewed.  Constitutional:      General: He is not in acute distress.    Appearance: He is well-developed. He is not ill-appearing.  HENT:     Head: Normocephalic and atraumatic.     Nose: Nose normal.     Mouth/Throat:     Mouth: Mucous membranes are moist.  Eyes:     Extraocular Movements: Extraocular movements intact.     Conjunctiva/sclera: Conjunctivae normal.     Pupils: Pupils are equal, round, and reactive to light.  Neck:     Musculoskeletal: Normal range of motion and neck supple. No muscular tenderness.  Cardiovascular:     Rate and Rhythm: Normal rate and regular rhythm.     Pulses: Normal pulses.     Heart sounds: Normal heart sounds. No murmur.  Pulmonary:     Effort: Pulmonary effort is normal. No respiratory distress.     Breath sounds: Normal breath sounds.  Abdominal:     Palpations: Abdomen is soft.     Tenderness: There is no abdominal tenderness.  Musculoskeletal: Normal range of motion.        General: No tenderness.  Skin:    General: Skin is warm and dry.     Capillary Refill: Capillary refill takes less than 2 seconds.  Neurological:     General: No focal deficit present.     Mental Status: He is alert and oriented to person, place, and time.     Cranial Nerves: No cranial nerve deficit.     Sensory: No sensory deficit.     Motor: No weakness.     Comments: 5+ out  of 5 strength, normal sensation, no drift, normal finger-to-nose finger      ED Treatments / Results  Labs (all labs ordered are listed, but only abnormal  results are displayed) Labs Reviewed  BASIC METABOLIC PANEL - Abnormal; Notable for the following components:      Result Value   Glucose, Bld 116 (*)    Creatinine, Ser 2.00 (*)    Calcium 8.7 (*)    GFR calc non Af Amer 31 (*)    GFR calc Af Amer 36 (*)    All other components within normal limits  CBC - Abnormal; Notable for the following components:   RBC 6.35 (*)    MCV 76.4 (*)    MCH 23.8 (*)    RDW 17.4 (*)    All other components within normal limits  URINALYSIS, ROUTINE W REFLEX MICROSCOPIC - Abnormal; Notable for the following components:   Protein, ur 100 (*)    All other components within normal limits  CBG MONITORING, ED - Abnormal; Notable for the following components:   Glucose-Capillary 128 (*)    All other components within normal limits  TROPONIN I (HIGH SENSITIVITY)  TROPONIN I (HIGH SENSITIVITY)    EKG None  Radiology Ct Head Wo Contrast  Result Date: 11/02/2018 CLINICAL DATA:  78 y/o M; syncopal episode with loss of consciousness hitting the left side of head. EXAM: CT HEAD WITHOUT CONTRAST CT CERVICAL SPINE WITHOUT CONTRAST TECHNIQUE: Multidetector CT imaging of the head and cervical spine was performed following the standard protocol without intravenous contrast. Multiplanar CT image reconstructions of the cervical spine were also generated. COMPARISON:  None. FINDINGS: CT HEAD FINDINGS Brain: No evidence of acute infarction, hemorrhage, hydrocephalus, extra-axial collection or mass lesion/mass effect. Mild volume loss of the brain. Vascular: Calcific atherosclerosis of the internal carotid arteries. No hyperdense vessel. Skull: Mild contusion of left posterior scalp. No calvarial fracture. Sinuses/Orbits: Moderate diffuse paranasal sinus mucosal thickening. Normal aeration of the mastoid air cells. Other: None. CT CERVICAL SPINE FINDINGS Alignment: Straightening of cervical lordosis without listhesis. Skull base and vertebrae: No acute fracture. No primary bone  lesion or focal pathologic process. Soft tissues and spinal canal: No prevertebral fluid or swelling. No visible canal hematoma. Disc levels: Moderate cervical spondylosis with predominantly discogenic degenerative changes. Uncovertebral and facet hypertrophy encroach on the neural foramen at the bilateral C3-4, bilateral C4-5, bilateral C5-6, and bilateral C6-7 levels. No high-grade bony spinal canal stenosis. Upper chest: Mild biapical pleuroparenchymal scarring. Other: Negative. IMPRESSION: 1. Small contusion of left posterior scalp. No calvarial fracture. 2. No acute intracranial abnormality. 3. Mild brain parenchymal volume loss. 4. Moderate paranasal sinus disease. 5. No acute fracture or dislocation of the cervical spine. 6. Moderate cervical spondylosis. No high-grade bony canal stenosis. Electronically Signed   By: Mitzi HansenLance  Furusawa-Stratton M.D.   On: 11/02/2018 23:50   Ct Cervical Spine Wo Contrast  Result Date: 11/02/2018 CLINICAL DATA:  78 y/o M; syncopal episode with loss of consciousness hitting the left side of head. EXAM: CT HEAD WITHOUT CONTRAST CT CERVICAL SPINE WITHOUT CONTRAST TECHNIQUE: Multidetector CT imaging of the head and cervical spine was performed following the standard protocol without intravenous contrast. Multiplanar CT image reconstructions of the cervical spine were also generated. COMPARISON:  None. FINDINGS: CT HEAD FINDINGS Brain: No evidence of acute infarction, hemorrhage, hydrocephalus, extra-axial collection or mass lesion/mass effect. Mild volume loss of the brain. Vascular: Calcific atherosclerosis of the internal carotid arteries. No hyperdense vessel. Skull: Mild contusion of left posterior  scalp. No calvarial fracture. Sinuses/Orbits: Moderate diffuse paranasal sinus mucosal thickening. Normal aeration of the mastoid air cells. Other: None. CT CERVICAL SPINE FINDINGS Alignment: Straightening of cervical lordosis without listhesis. Skull base and vertebrae: No acute  fracture. No primary bone lesion or focal pathologic process. Soft tissues and spinal canal: No prevertebral fluid or swelling. No visible canal hematoma. Disc levels: Moderate cervical spondylosis with predominantly discogenic degenerative changes. Uncovertebral and facet hypertrophy encroach on the neural foramen at the bilateral C3-4, bilateral C4-5, bilateral C5-6, and bilateral C6-7 levels. No high-grade bony spinal canal stenosis. Upper chest: Mild biapical pleuroparenchymal scarring. Other: Negative. IMPRESSION: 1. Small contusion of left posterior scalp. No calvarial fracture. 2. No acute intracranial abnormality. 3. Mild brain parenchymal volume loss. 4. Moderate paranasal sinus disease. 5. No acute fracture or dislocation of the cervical spine. 6. Moderate cervical spondylosis. No high-grade bony canal stenosis. Electronically Signed   By: Mitzi HansenLance  Furusawa-Stratton M.D.   On: 11/02/2018 23:50   Dg Chest Portable 1 View  Result Date: 11/02/2018 CLINICAL DATA:  78 year old male status post fall. EXAM: PORTABLE CHEST 1 VIEW COMPARISON:  12/05/2016 and earlier. FINDINGS: Portable AP semi upright view at 2241 hours. Stable low lung volumes. Mediastinal contours remain within normal limits. Visualized tracheal air column is within normal limits. Mild chronic increased interstitial markings are stable. No pneumothorax, pleural effusion or acute pulmonary opacity. No acute osseous abnormality identified. IMPRESSION: No acute cardiopulmonary abnormality or acute traumatic injury identified. Electronically Signed   By: Odessa FlemingH  Hall M.D.   On: 11/02/2018 22:57    Procedures Procedures (including critical care time)  Medications Ordered in ED Medications  sodium chloride flush (NS) 0.9 % injection 3 mL (has no administration in time range)     Initial Impression / Assessment and Plan / ED Course  I have reviewed the triage vital signs and the nursing notes.  Pertinent labs & imaging results that were  available during my care of the patient were reviewed by me and considered in my medical decision making (see chart for details).     Farhaan Celene Skeen Massman is a 78 year old male with history of end-stage renal disease, interstitial lung disease on 3 L of oxygen who presents to the ED after a fall, loss of consciousness.  Patient with left-sided headache.  Patient was walking around the house without his oxygen, felt short of breath.  Put his oxygen back on and then fell and hit the left side of his head.  He did lose consciousness.  Patient was hypoxic on his 3 L of oxygen when EMS got there but has now normalized.  Has left-sided headache.  Normal neurological exam.  Denies any chest pain.  Overall patient has no symptoms.  EKG shows T wave inversions throughout.  These are new.  He denies any chest pain.  Overall EKG is concerning.  We will get a troponin.  Will get chest x-ray, head CT, neck CT.  Basic labs.  Patient to be reevaluated for need for possible syncope observation. Overall believe the fall was likely due to hypoxia as patient was not wearing his oxygen.  Patient was signed out to oncoming ED staff with patient pending lab work and imaging.  Given EKG finding may warrant admission even if blood work is unremarkable.  Family states that patient appears to be at his baseline.  Recommend shared decision making about admission.  This chart was dictated using voice recognition software.  Despite best efforts to proofread,  errors can occur  which can change the documentation meaning.    Final Clinical Impressions(s) / ED Diagnoses   Final diagnoses:  Syncope and collapse    ED Discharge Orders    None       Lennice Sites, DO 11/03/18 0024

## 2018-11-02 NOTE — ED Triage Notes (Signed)
Per EMS, pt from home, had been visiting w/ family, took off O2, (normally on 3L at home) was walking around and may have had "a few glasses of wine."  He got up to use the bathroom and had a syncopal episode.  Pt did experience short LOC, hit the left side of his head, not on blood thinners.  When EMS arrived initial O2 was 86%, once O2 was reapplied he maintained 95%.  Alert and oriented at this time.  180/81 CBG 122 Heart rate 50-60

## 2018-11-02 NOTE — ED Provider Notes (Signed)
  11:05 PM  Assumed care from Dr. Ronnald Nian.  Patient is a 78 y.o. M COPD, CKD, HTN with syncope at home.  Wears O2 normally.  Was at party and was not wearing O2.  Felt SOB and put O2 on and then later passed out and hit head.  EKG shows diffuse T wave inversions that is new from 2014.  No chest pain.  Neuro intact.  No blood thinners.  Labs, CT head, urine pending.  Will repeat EKG.  1:37 AM  Pt's labs show creatinine of 2.0 which appears to be his baseline.  High-sensitivity troponin is 13.  Head CT shows mild contusion of the posterior scalp but no intracranial findings.  CT of the cervical spine shows no acute abnormality.  Chest x-ray clear.  Patient now states he is having some left chest discomfort after the CT scan.  He thinks he may have injured his chest somehow transporting onto the CT bed.  He is tender to palpation just underneath the left nipple.  He is declining any pain medication.  Will give aspirin but have recommended given his EKG abnormalities that he be admitted to the hospital.  Will also obtain a d-dimer.  Patient and son are comfortable with this plan.  PCP is Dr. Holley Raring at [facility].   2:03 AM Discussed patient's case with hospitalist, Dr. Marlowe Sax.  I have recommended admission and patient (and family if present) agree with this plan. Admitting physician will place admission orders.    Hospitalist has requested cardiology consultation.   I reviewed all nursing notes, vitals, pertinent previous records, EKGs, lab and urine results, imaging (as available).   2:13 AM  Discussed with Dr. Ulysees Barns, cardiology fellow.  He recommended serial troponins and echocardiogram.    EKG Interpretation  Date/Time:  Sunday November 02 2018 23:56:45 EDT Ventricular Rate:  54 PR Interval:    QRS Duration: 104 QT Interval:  507 QTC Calculation: 481 R Axis:   97 Text Interpretation:  Sinus rhythm Probable left atrial enlargement Right axis deviation Abnormal T, probable  ischemia, widespread Confirmed by Pryor Curia [phone]) on 11/03/2018 1:04:34 AM        Kahmari Koller, Delice Bison, DO 11/03/18 0214

## 2018-11-03 DIAGNOSIS — R55 Syncope and collapse: Secondary | ICD-10-CM | POA: Diagnosis present

## 2018-11-03 DIAGNOSIS — R9431 Abnormal electrocardiogram [ECG] [EKG]: Secondary | ICD-10-CM | POA: Diagnosis not present

## 2018-11-03 LAB — BASIC METABOLIC PANEL
Anion gap: 9 (ref 5–15)
BUN: 27 mg/dL — ABNORMAL HIGH (ref 8–23)
CO2: 22 mmol/L (ref 22–32)
Calcium: 8.5 mg/dL — ABNORMAL LOW (ref 8.9–10.3)
Chloride: 110 mmol/L (ref 98–111)
Creatinine, Ser: 2.07 mg/dL — ABNORMAL HIGH (ref 0.61–1.24)
GFR calc Af Amer: 34 mL/min — ABNORMAL LOW (ref >60)
GFR calc non Af Amer: 30 mL/min — ABNORMAL LOW (ref >60)
Glucose, Bld: 94 mg/dL (ref 70–99)
Potassium: 4.5 mmol/L (ref 3.5–5.1)
Sodium: 141 mmol/L (ref 135–145)

## 2018-11-03 LAB — TROPONIN I (HIGH SENSITIVITY)
Troponin I (High Sensitivity): 16 ng/L (ref <18)
Troponin I (High Sensitivity): 16 ng/L (ref <18)

## 2018-11-03 LAB — HEMOGLOBIN A1C
Hgb A1c MFr Bld: 6.5 % — ABNORMAL HIGH (ref 4.8–5.6)
Mean Plasma Glucose: 139.85 mg/dL

## 2018-11-03 LAB — GLUCOSE, CAPILLARY: Glucose-Capillary: 133 mg/dL — ABNORMAL HIGH (ref 70–99)

## 2018-11-03 LAB — D-DIMER, QUANTITATIVE: D-Dimer, Quant: 0.63 ug/mL-FEU — ABNORMAL HIGH (ref 0.00–0.50)

## 2018-11-03 LAB — SARS CORONAVIRUS 2 BY RT PCR (HOSPITAL ORDER, PERFORMED IN ~~LOC~~ HOSPITAL LAB): SARS Coronavirus 2: NEGATIVE

## 2018-11-03 MED ORDER — INSULIN ASPART 100 UNIT/ML ~~LOC~~ SOLN
0.0000 [IU] | Freq: Three times a day (TID) | SUBCUTANEOUS | Status: DC
  Administered 2018-11-03: 1 [IU] via SUBCUTANEOUS

## 2018-11-03 MED ORDER — INSULIN ASPART 100 UNIT/ML ~~LOC~~ SOLN: 0.0000 [IU] | Freq: Every day | SUBCUTANEOUS | Status: DC

## 2018-11-03 MED ORDER — TAMSULOSIN HCL 0.4 MG PO CAPS: 0.4000 mg | ORAL_CAPSULE | Freq: Every day | ORAL | Status: DC

## 2018-11-03 MED ORDER — METOPROLOL SUCCINATE ER 50 MG PO TB24: 50.0000 mg | ORAL_TABLET | Freq: Every day | ORAL | Status: AC

## 2018-11-03 MED ORDER — ATORVASTATIN CALCIUM 80 MG PO TABS
80.0000 mg | ORAL_TABLET | Freq: Every day | ORAL | Status: DC
  Administered 2018-11-03: 10:00:00 80 mg via ORAL
  Filled 2018-11-03: qty 1

## 2018-11-03 MED ORDER — METOPROLOL SUCCINATE ER 50 MG PO TB24
50.0000 mg | ORAL_TABLET | Freq: Every day | ORAL | Status: DC
  Filled 2018-11-03: qty 1

## 2018-11-03 MED ORDER — HEPARIN SODIUM (PORCINE) 5000 UNIT/ML IJ SOLN
5000.0000 [IU] | Freq: Three times a day (TID) | INTRAMUSCULAR | Status: DC
  Administered 2018-11-03: 5000 [IU] via SUBCUTANEOUS
  Filled 2018-11-03: qty 1

## 2018-11-03 MED ORDER — METOPROLOL SUCCINATE ER 100 MG PO TB24
100.0000 mg | ORAL_TABLET | Freq: Every day | ORAL | Status: DC
  Filled 2018-11-03: qty 1

## 2018-11-03 MED ORDER — ACETAMINOPHEN 325 MG PO TABS: 650.0000 mg | ORAL_TABLET | Freq: Four times a day (QID) | ORAL | Status: DC | PRN

## 2018-11-03 MED ORDER — HYDRALAZINE HCL 20 MG/ML IJ SOLN
5.0000 mg | INTRAMUSCULAR | Status: DC | PRN
  Administered 2018-11-03: 15:00:00 5 mg via INTRAVENOUS
  Filled 2018-11-03: qty 1

## 2018-11-03 MED ORDER — ALBUTEROL SULFATE (2.5 MG/3ML) 0.083% IN NEBU: 2.5000 mg | INHALATION_SOLUTION | Freq: Four times a day (QID) | RESPIRATORY_TRACT | Status: DC | PRN

## 2018-11-03 MED ORDER — ADULT MULTIVITAMIN W/MINERALS CH
1.0000 | ORAL_TABLET | Freq: Every day | ORAL | Status: DC
  Administered 2018-11-03: 1 via ORAL
  Filled 2018-11-03: qty 1

## 2018-11-03 MED ORDER — ASPIRIN 81 MG PO CHEW
324.0000 mg | CHEWABLE_TABLET | Freq: Once | ORAL | Status: AC
  Administered 2018-11-03: 324 mg via ORAL
  Filled 2018-11-03: qty 4

## 2018-11-03 MED ORDER — ALLOPURINOL 100 MG PO TABS
100.0000 mg | ORAL_TABLET | Freq: Every day | ORAL | Status: DC
  Administered 2018-11-03: 100 mg via ORAL
  Filled 2018-11-03: qty 1

## 2018-11-03 NOTE — ED Notes (Signed)
Unsuccessful blood draw. RN Jennifer informed 

## 2018-11-03 NOTE — Progress Notes (Signed)
SATURATION QUALIFICATIONS: (This note is used to comply with regulatory documentation for home oxygen)  Patient Saturations on Room Air at Rest = 86%   Please briefly explain why patient needs home oxygen:  Unable to maintain 02 sats without supplimental 02

## 2018-11-03 NOTE — H&P (Signed)
History and Physical    Stanley Sullivan OZH:[id number] DOB: [birth year] DOA: 11/02/2018  PCP: Wilson SingerJariwala, Arvind N, MD Patient coming from: Home  Chief Complaint: Syncope  HPI: Stanley Sullivan is a 78 y.o. male with medical history significant of CKD stage III, BPH, hypertension, interstitial lung disease, chronic hypoxic respiratory failure on 3 L home oxygen, OSA, type 2 diabetes presenting to the hospital for evaluation of a syncopal episode.  History provided by patient and son at bedside.  Son states he is visiting his father from out of town.  States the patient normally does not drink alcohol but today since family was visiting had a few glasses of wine.  Patient is supposed to use 3 L home oxygen but he was not using it today.  They had dinner and then patient walked outside.  He then walked back in and put his oxygen on and was walking to the bathroom and they heard a loud sound.  When they went to check on the patient they discovered that he had passed out.  He regained consciousness after a few seconds.  Family did not notice any seizure-like activity.  Patient denies any chest pain or dizziness prior to his fall.  States he felt short of breath while walking and was trying to put on his oxygen.  Family states he hit the left side of his head during the fall. Denies any history of prior MI or CAD.  Denies any nausea, vomiting, abdominal pain, diarrhea, or dysuria.   ED Course: Afebrile.  Pulse in the 50s, on beta-blocker.  Not tachypneic.  Blood pressure 185/93.  EKG with new diffuse T wave inversions in anterior, lateral, and inferior leads.  High-sensitivity troponin negative.  UA not suggestive of infection.  D-dimer pending.  COVID-19 rapid test pending.  Chest x-ray showing no acute cardiopulmonary abnormality.  CT head showing small contusion of left posterior scalp, no calvarial fracture, no acute intracranial abnormality.  CT C-spine showing moderate cervical spondylosis, no high-grade  bony canal stenosis, and no acute fracture or dislocation.  Patient received aspirin 324 mg in the ED.  Review of Systems:  All systems reviewed and apart from history of presenting illness, are negative.  Past Medical History:  Diagnosis Date   Allergy, unspecified not elsewhere classified    Benign prostatic hypertrophy 08/24/2013   Chronic kidney disease, stage III (moderate) (HCC)    patient denies   Chronic respiratory failure with hypoxia (HCC) 12/05/2016   Dyspnea on exertion 04/10/2013   External hemorrhoids without mention of complication    Gout, unspecified    Hypertension    Hypertrophy of prostate without urinary obstruction and other lower urinary tract symptoms (LUTS)    Interstitial lung disease (HCC) 10/29/2014   Maculopapular rash, generalized 10/02/2012   Obesity, unspecified    On home oxygen therapy    in the morning and evening, with exertion, as needed when O2 drops to 80s   Organic sleep apnea, unspecified    CPAP, presure setting 3   OSA (obstructive sleep apnea) 10/09/2013   Osteoarthrosis, unspecified whether generalized or localized, unspecified site    Other abnormal glucose    Other and unspecified hyperlipidemia    Other atopic dermatitis and related conditions    Other specified visual disturbances    Pain in joint, lower leg    Pain in limb    Primary osteoarthritis of right knee 10/29/2014   S/P total knee replacement using cement, 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 His smoking use included cigarettes. He has a 7.50 pack-year smoking history. He has quit using smokeless tobacco.  His smokeless tobacco use included chew. He reports current alcohol use. He reports that he does not use drugs.  Allergies  Allergen Reactions   No Known Allergies     No family history on file.  Prior to Admission medications   Medication Sig Start Date End Date Taking? Authorizing Provider  acetaminophen (TYLENOL) 325 MG tablet Take 650 mg by mouth. Take two tablets (650 mg) every 6 hours as needed for pain    [provider]  albuterol (PROVENTIL HFA;VENTOLIN HFA) 108 (90 Base) MCG/ACT inhaler Inhale 2 puffs into the lungs. Into lungs every 6 hours as needed for wheezing or shortness of breath or cough    [provider]  allopurinol (ZYLOPRIM) 100 MG tablet Take 1 tablet (100 mg total) by mouth daily. For gout 10/13/15   Renato Gailseed, Tiffany L, DO  aspirin 325 MG tablet Take 1 tablet (325 mg total) by mouth 2 (two) times daily after a meal. 12/06/16   Petrarca, Oris DroneBrian D, PA-C  atorvastatin (LIPITOR) 80 MG tablet TAKE ONE TABLET BY MOUTH ONE TIME DAILY  03/04/17   Kirt Boysarter, Monica, DO  CALCIUM PO Take 500 mg by mouth daily.     [provider]  cetirizine (KLS ALLER-TEC) 10 MG tablet Take 10 mg by mouth daily as needed (for allergies.).     [provider]  cholecalciferol (VITAMIN D) 1000 units tablet Take 1,000 Units by mouth daily.    [provider]  Cyanocobalamin (VITAMIN B-12) 2500 MCG SUBL Place 2,500 mcg under the tongue daily.    [provider]  furosemide (LASIX) 20 MG tablet Take 1 tablet (20 mg total) by mouth daily. For swelling of legs 07/18/15   Reed, Tiffany L, DO  HYDROcodone-acetaminophen (NORCO/VICODIN) 5-325 MG tablet Take 1 tablet by mouth every 6 (six) hours as needed for moderate pain. 12/06/16   Jetty PeeksPetrarca, Brian D, PA-C    losartan (COZAAR) 100 MG tablet TAKE ONE TABLET BY MOUTH ONE TIME DAILY  03/04/17   Kirt Boysarter, Monica, DO  methocarbamol (ROBAXIN) 500 MG tablet Take 500 mg by mouth 3 (three) times daily as needed. Take one tablet three times daily for muscle spasms     [provider]  metoprolol succinate (TOPROL-XL) 100 MG 24 hr tablet TAKE 1 TABLET (100 MG TOTAL) BY MOUTH DAILY. 06/25/16   Reed, Tiffany L, DO  polyvinyl alcohol (LUBRICANT DROPS) 1.4 % ophthalmic solution Place 1 drop into both eyes 3 (three) times daily as needed for dry eyes.    [provider]  tamsulosin (FLOMAX) 0.4 MG CAPS capsule TAKE 1 CAPSULE BY MOUTH ONCE A DAY FOR YOUR PROSTATE 04/16/16   Kermit Baloeed, Tiffany L, DO    Physical Exam: Vitals:   11/03/18 0145 11/03/18 0300 11/03/18 0330 11/03/18 0358  BP: (!) 163/79 Marland Kitchen(!)  179/82 (!) 193/84 (!) 151/103  Pulse: (!) 59 (!) 54 (!) 50 (!) 57  Resp: (!) Temp:    98.8 F (37.1 C)  TempSrc:    Oral  SpO2: 96% 94% 97% 92%    Physical Exam  Constitutional: He is oriented to person, place, and time. He appears well-developed and well-nourished. No distress.  HENT:  Head: Normocephalic.  Mouth/Throat: Oropharynx is clear and moist.  Eyes: Right eye exhibits no discharge. Left eye exhibits no discharge.  Neck: Neck supple.  Cardiovascular: Normal rate, regular rhythm and intact distal pulses.  Pulmonary/Chest: Effort normal and breath sounds normal. No respiratory distress. He has no wheezes. He has no rales.  Abdominal: Soft. Bowel sounds are normal. He exhibits no distension. There is no abdominal tenderness. There is no guarding.  Musculoskeletal:        General: No edema.  Neurological: He is alert and oriented to person, place, and time.  Skin: Skin is warm and dry. He is not diaphoretic.     Labs on Admission: I have personally reviewed following labs and imaging studies  CBC: Recent Labs  Lab 11/02/18 2221  WBC 7.8  HGB 15.1  HCT 48.5  MCV 76.4*   PLT 163   Basic Metabolic Panel: Recent Labs  Lab 11/02/18 2221  NA 142  K 4.4  CL 111  CO2 24  GLUCOSE 116*  BUN 23  CREATININE 2.00*  CALCIUM 8.7*   GFR: CrCl cannot be calculated (Unknown ideal weight.). Liver Function Tests: No results for input(s): AST, ALT, ALKPHOS, BILITOT, PROT, ALBUMIN in the last 168 hours. No results for input(s): LIPASE, AMYLASE in the last 168 hours. No results for input(s): AMMONIA in the last 168 hours. Coagulation Profile: No results for input(s): INR, PROTIME in the last 168 hours. Cardiac Enzymes: No results for input(s): CKTOTAL, CKMB, CKMBINDEX, TROPONINI in the last 168 hours. BNP (last 3 results) No results for input(s): PROBNP in the last 8760 hours. HbA1C: No results for input(s): HGBA1C in the last 72 hours. CBG: Recent Labs  Lab 11/02/18 2222  GLUCAP 128*   Lipid Profile: No results for input(s): CHOL, HDL, LDLCALC, TRIG, CHOLHDL, LDLDIRECT in the last 72 hours. Thyroid Function Tests: No results for input(s): TSH, T4TOTAL, FREET4, T3FREE, THYROIDAB in the last 72 hours. Anemia Panel: No results for input(s): VITAMINB12, FOLATE, FERRITIN, TIBC, IRON, RETICCTPCT in the last 72 hours. Urine analysis:    Component Value Date/Time   COLORURINE YELLOW 11/02/2018 2240   APPEARANCEUR CLEAR 11/02/2018 2240   LABSPEC 1.013 11/02/2018 2240   PHURINE 6.0 11/02/2018 2240   GLUCOSEU NEGATIVE 11/02/2018 2240   HGBUR NEGATIVE 11/02/2018 2240   BILIRUBINUR NEGATIVE 11/02/2018 2240   KETONESUR NEGATIVE 11/02/2018 2240   PROTEINUR 100 (A) 11/02/2018 2240   UROBILINOGEN 0.2 09/28/2013 1039   NITRITE NEGATIVE 11/02/2018 2240   LEUKOCYTESUR NEGATIVE 11/02/2018 2240    Radiological Exams on Admission: Ct Head Wo Contrast  Result Date: 11/02/2018 CLINICAL DATA:  78 y/o M; syncopal episode with loss of consciousness hitting the left side of head. EXAM: CT HEAD WITHOUT CONTRAST CT CERVICAL SPINE WITHOUT CONTRAST TECHNIQUE: Multidetector  CT imaging of the head and cervical spine was performed following the standard protocol without intravenous contrast. Multiplanar CT image reconstructions of the cervical spine were also generated. COMPARISON:  None. FINDINGS: CT HEAD FINDINGS Brain: No evidence of acute infarction, hemorrhage, hydrocephalus, extra-axial collection or mass lesion/mass effect. Mild volume loss of the brain. Vascular:  Calcific atherosclerosis of the internal carotid arteries. No hyperdense vessel. Skull: Mild contusion of left posterior scalp. No calvarial fracture. Sinuses/Orbits: Moderate diffuse paranasal sinus mucosal thickening. Normal aeration of the mastoid air cells. Other: None. CT CERVICAL SPINE FINDINGS Alignment: Straightening of cervical lordosis without listhesis. Skull base and vertebrae: No acute fracture. No primary bone lesion or focal pathologic process. Soft tissues and spinal canal: No prevertebral fluid or swelling. No visible canal hematoma. Disc levels: Moderate cervical spondylosis with predominantly discogenic degenerative changes. Uncovertebral and facet hypertrophy encroach on the neural foramen at the bilateral C3-4, bilateral C4-5, bilateral C5-6, and bilateral C6-7 levels. No high-grade bony spinal canal stenosis. Upper chest: Mild biapical pleuroparenchymal scarring. Other: Negative. IMPRESSION: 1. Small contusion of left posterior scalp. No calvarial fracture. 2. No acute intracranial abnormality. 3. Mild brain parenchymal volume loss. 4. Moderate paranasal sinus disease. 5. No acute fracture or dislocation of the cervical spine. 6. Moderate cervical spondylosis. No high-grade bony canal stenosis. Electronically Signed   By: Kristine Garbe M.D.   On: 11/02/2018 23:50   Ct Cervical Spine Wo Contrast  Result Date: 11/02/2018 CLINICAL DATA:  78 y/o M; syncopal episode with loss of consciousness hitting the left side of head. EXAM: CT HEAD WITHOUT CONTRAST CT CERVICAL SPINE WITHOUT CONTRAST  TECHNIQUE: Multidetector CT imaging of the head and cervical spine was performed following the standard protocol without intravenous contrast. Multiplanar CT image reconstructions of the cervical spine were also generated. COMPARISON:  None. FINDINGS: CT HEAD FINDINGS Brain: No evidence of acute infarction, hemorrhage, hydrocephalus, extra-axial collection or mass lesion/mass effect. Mild volume loss of the brain. Vascular: Calcific atherosclerosis of the internal carotid arteries. No hyperdense vessel. Skull: Mild contusion of left posterior scalp. No calvarial fracture. Sinuses/Orbits: Moderate diffuse paranasal sinus mucosal thickening. Normal aeration of the mastoid air cells. Other: None. CT CERVICAL SPINE FINDINGS Alignment: Straightening of cervical lordosis without listhesis. Skull base and vertebrae: No acute fracture. No primary bone lesion or focal pathologic process. Soft tissues and spinal canal: No prevertebral fluid or swelling. No visible canal hematoma. Disc levels: Moderate cervical spondylosis with predominantly discogenic degenerative changes. Uncovertebral and facet hypertrophy encroach on the neural foramen at the bilateral C3-4, bilateral C4-5, bilateral C5-6, and bilateral C6-7 levels. No high-grade bony spinal canal stenosis. Upper chest: Mild biapical pleuroparenchymal scarring. Other: Negative. IMPRESSION: 1. Small contusion of left posterior scalp. No calvarial fracture. 2. No acute intracranial abnormality. 3. Mild brain parenchymal volume loss. 4. Moderate paranasal sinus disease. 5. No acute fracture or dislocation of the cervical spine. 6. Moderate cervical spondylosis. No high-grade bony canal stenosis. Electronically Signed   By: Kristine Garbe M.D.   On: 11/02/2018 23:50   Dg Chest Portable 1 View  Result Date: 11/02/2018 CLINICAL DATA:  78 year old male status post fall. EXAM: PORTABLE CHEST 1 VIEW COMPARISON:  12/05/2016 and earlier. FINDINGS: Portable AP semi  upright view at 2241 hours. Stable low lung volumes. Mediastinal contours remain within normal limits. Visualized tracheal air column is within normal limits. Mild chronic increased interstitial markings are stable. No pneumothorax, pleural effusion or acute pulmonary opacity. No acute osseous abnormality identified. IMPRESSION: No acute cardiopulmonary abnormality or acute traumatic injury identified. Electronically Signed   By: Genevie Ann M.D.   On: 11/02/2018 22:57    EKG: Independently reviewed.  Sinus rhythm.  New diffuse T wave inversions in anterior, lateral, and inferior leads.  Assessment/Plan Principal Problem:   Syncope Active Problems:   Essential hypertension   OSA (obstructive sleep  apnea)   ILD (interstitial lung disease) (HCC)   DM (diabetes mellitus) type II controlled with renal manifestation (HCC)   Syncope Although syncope could be due to hypoxia from not using his home oxygen or possible alcohol intoxication, EKG abnormal with new diffuse T wave inversions in anterior, lateral, and inferior leads.  Repeat EKG was done in the ED and these findings persisted.  High-sensitivity troponin checked twice negative.  Patient appears comfortable on exam and denies any chest pain.  Age-adjusted d-dimer normal.  Chest x-ray showing no acute cardiopulmonary abnormality.  Family did not notice any seizure-like activity.  No infectious signs or symptoms. -Received full dose aspirin in the ED -Cardiac monitoring -Echocardiogram -Trend troponin -Repeat EKG in a.m. -Discussed with cardiology.  Etiology of his EKG findings is thought to be related to RVH/cor pulmonale.  Cardiology recommending doing an echocardiogram in the morning. -Check orthostatics  CKD stage III Creatinine 2.0, ranging from 1.4-2.1 in 2018.  No recent labs. -Continue to monitor renal function  BPH -Continue home tamsulosin  Hypertension Blood pressure elevated with systolic ranging from 160s to [id number]. -Continue  home metoprolol -Hydralazine PRN  Hyperlipidemia -Continue home statin  Gout -Continue home allopurinol  Interstitial lung disease, chronic hypoxic respiratory failure Currently on 3 L supplemental oxygen, same as home requirement. -Continue supplemental oxygen  Type 2 diabetes -Check A1c.  Sliding scale insulin sensitive and CBG checks.  OSA -CPAP at night  DVT prophylaxis: Subcutaneous heparin Code Status: Full code.  Discussed with patient and his son. Family Communication: Son at bedside. Disposition Plan: Anticipate discharge after clinical improvement. Consults called: Cardiology (Dr. Marlane MingleMoscona) Admission status: It is my clinical opinion that referral for OBSERVATION is reasonable and necessary in this patient based on the above information provided. The aforementioned taken together are felt to place the patient at high risk for further clinical deterioration. However it is anticipated that the patient may be medically stable for discharge from the hospital within 24 to 48 hours.  The medical decision making on this patient was of high complexity and the patient is at high risk for clinical deterioration, therefore this is a level 3 visit.  John GiovanniVasundhra Savian Mazon MD [facility] Pager [phone]- [phone]  If 7PM-7AM, please contact night-coverage [email or web address] Password [email or web address]  11/03/2018, 4:02 AM

## 2018-11-03 NOTE — Progress Notes (Addendum)
   Patient is a 78 year old male with a history of interstitial lung disease with chronic respiratory failure on home O2 3L, hypertension, and type 2 diabetes melllitus who was admitted by the Hospitalist service overnight for syncope and abnormal EKG. Overnight fellow, Dr. Ulysees Barns, saw patient after midnight.  In summary, patient had brief episode of syncope after taking his oxygen off and walking to the bathroom. Patient was short of breath prior to this episode and was trying put on his home O2 before he passed out. Patient denied any chest pain or lightheadedness prior to episode.   EKG shows sinus bradycardia with right axis devaiation and diffuse T wave inversion. Last EKG this morning shows the same with minimal ST depression (<5mm) in aVF and V5.     High-sensitivity troponin negative x3.   Telemetry shows sinus bradycardia with rates mostly in the high 40's to 50's. Patient on Toprol-XL 100mg  daily. Will discontinue at this time.   Echocardiogram has been ordered but not performed yet. Further recommendations pending Echo results.  Stanley Mclean, PA-C 11/03/2018 9:52 AM  Personally seen and examined. Agree with above.   Able to review records from last year at [facility].  Nuclear stress test 2019 showed no ischemia normal ejection fraction.  Echocardiogram at that time showed normal EF with severe estimated elevated pulmonary pressures consistent with secondary pulmonary hypertension.  Crackles heard on lung exam Regular rate and rhythm.  Telemetry showed occasional heart rates in the mid 40s sinus, asymptomatic  Syncope - Unexplained cause.  Question secondary to vasodilatory effect after drinking a few glasses of wine.  Could preload have been decreased and with his concomitant secondary pulmonary hypertension from his interstitial lung disease and hypertension could this have caused a transient drop in his blood pressure.  Abnormal EKG -This has been worked up last year.   Stress test was normal.  This is unchanged from last year.  Mild LVH noted on EKG.  Hyperdynamic function. ECHO done 2019.  No phenotypic evidence of hypertrophic cardiomyopathy.  Troponins have been normal.  It is not unreasonable to recheck an echocardiogram to make sure that he has proper structure and function however given his recent study 2019 I don't feel strongly that this needs to be done.  Otherwise, his Toprol has been cut in half to 50 mg from 100 given his asymptomatic bradycardia.  Creatinine last year was 1.9.  Stable.  I would consider resuming his angiotensin blocker.  Also consider more intensive treatment of his hypertension.  Prior cardiology care was at [facility], Dr. Wyline Copas.  I am comfortable with DC home.   Stanley Furbish, MD

## 2018-11-03 NOTE — ED Notes (Signed)
Attempted to call report

## 2018-11-03 NOTE — Discharge Summary (Signed)
Physician Discharge Summary  Stanley Sullivan MVH:[id number] DOB: 09-Sep-1940 DOA: 11/02/2018  PCP: Stanley Sullivan, Stanley Sullivan, Stanley Sullivan  Admit date: 11/02/2018 Discharge date: 11/03/2018  Time spent: 25 minutes  Recommendations for Outpatient Follow-up:  1. Follow-up with outpatient primary care physician cardiologist 2. Note dosage change Toprol-XL 100-->50 mg daily 3. Needs Chem-12, CBC as an outpatient 4. Repeat high resolution CT scan and refer to pulmonology if not done previously for interstitial fibrosis  Discharge Diagnoses:  Principal Problem:   Syncope Active Problems:   Essential hypertension   OSA (obstructive sleep apnea)   ILD (interstitial lung disease) (HCC)   DM (diabetes mellitus) type II controlled with renal manifestation [facility](HCC)   Discharge Condition: Improved  Diet recommendation: Liberalize fluid slightly to 1800 cc short-term  Filed Weights   11/03/18 0358  Weight: 72.2 kg    History of present illness:  78 year old male known ILD on 3 L DM TY 2, CKD 3, HTN, OSA presented from home 7/5 after fall-patient had an accidental fall while not wearing his usual oxygen and after drinking an undisclosed amount of wine He was admitted with syncope unknown origin and EKG was abnormal prompting cardiology consult Cardiology saw the patient adjusted medications and felt that if echocardiogram did not show any worsening of his already severe right-sided heart failure he would be stable for discharge  We cut back his metoprolol XL from 150 mg daily, he was resumed on numerous medications in the hospital He was counseled regarding using oxygen at all times to ensure that he does not become hypoxic and syncopal as this might have been a part of his presentation [in addition to the ETOH and mild bradycardia] The tele monitors didn't reveal any pauses during his stay It is noted that he is seen by Dr. Marchelle Gearingamaswamy in the outpatient setting for ILD and I will CC him on this note to ensure he is  not lost to follow-up  He may require titration of oxygen and/or initiation of CPAP if he is able to tolerate He may need another sleep study  Procedures:  Consultations:  Cardiology  Discharge Exam: Vitals:   11/03/18 1300 11/03/18 1400  BP: (!) 171/97 (!) 186/88  Pulse:  (!) 49  Resp:    Temp:  97.6 F (36.4 C)  SpO2:  100%    General: alert pleasant no distress, eomi ncat arcus senilis Cardiovascular: s1 s2 no brady no paue on monitors Respiratory: clear no added sound no rale sno rhonchi abd soft nt nd no rebound no guard No LE edema Neuro intact  Discharge Instructions   Discharge Instructions    Diet - low sodium heart healthy   Complete by: As directed    Discharge instructions   Complete by: As directed    In the hospital you were diagnosed with fainting spells which were probably secondary to your low heart rate in addition to slightly more alcohol than you usually accustomed to taking Regardless of this however we have cut back doses of various medications including your metoprolol as this could have been the culprit for why you are having a fainting spell Cardiology saw you and recommended no other work-up other than the echocardiogram and [facility] I would recommend you drink reasonable amounts of fluid up to 1800 cc a day in the summer months to ensure that you do not get dehydrated Please continue your oxygen and follow-up with your regular physician-make sure that you use your oxygen  at all times as this can also be a reason for why you are feeling somewhat faint   Increase activity slowly   Complete by: As directed      Allergies as of 11/03/2018      Reactions   No Known Allergies       Medication List    STOP taking these medications   aspirin 325 MG tablet   HYDROcodone-acetaminophen 5-325 MG tablet Commonly known as: NORCO/VICODIN     TAKE these medications   albuterol 108 (90 Base) MCG/ACT  inhaler Commonly known as: VENTOLIN HFA Inhale 2 puffs into the lungs. Into lungs every 6 hours as needed for wheezing or shortness of breath or cough   allopurinol 100 MG tablet Commonly known as: ZYLOPRIM Take 1 tablet (100 mg total) by mouth daily. For gout   atorvastatin 80 MG tablet Commonly known as: LIPITOR TAKE ONE TABLET BY MOUTH ONE TIME DAILY   furosemide 20 MG tablet Commonly known as: LASIX Take 1 tablet (20 mg total) by mouth daily. For swelling of legs   KLS Aller-Tec 10 MG tablet Generic drug: cetirizine Take 10 mg by mouth daily as needed (for allergies.).   losartan 100 MG tablet Commonly known as: COZAAR TAKE ONE TABLET BY MOUTH ONE TIME DAILY   Lubricant Drops 1.4 % ophthalmic solution Generic drug: polyvinyl alcohol Place 1 drop into both eyes 3 (three) times daily as needed for dry eyes.   metoprolol succinate 50 MG 24 hr tablet Commonly known as: TOPROL-XL Take 1 tablet (50 mg total) by mouth daily. What changed:   medication strength  how much to take   multivitamin with minerals Tabs tablet Take 1 tablet by mouth daily.   tamsulosin 0.4 MG Caps capsule Commonly known as: FLOMAX TAKE 1 CAPSULE BY MOUTH ONCE A DAY FOR YOUR PROSTATE What changed: See the new instructions.   Tylenol 325 MG tablet Generic drug: acetaminophen Take 650 mg by mouth. Take two tablets (650 mg) every 6 hours as needed for pain            Durable Medical Equipment  (From admission, onward)         Start     Ordered   11/03/18 1545  DME Oxygen  Once    Question Answer Comment  Length of Need Lifetime   Mode or (Route) Nasal cannula   Liters per Minute 3   Frequency Continuous (stationary and portable oxygen unit needed)   Oxygen conserving device Yes   Oxygen delivery system Gas      11/03/18 1544         Allergies  Allergen Reactions  . No Known Allergies       The results of significant diagnostics from this hospitalization (including  imaging, microbiology, ancillary and laboratory) are listed below for reference.    Significant Diagnostic Studies: Ct Head Wo Contrast  Result Date: 11/02/2018 CLINICAL DATA:  78 y/o M; syncopal episode with loss of consciousness hitting the left side of head. EXAM: CT HEAD WITHOUT CONTRAST CT CERVICAL SPINE WITHOUT CONTRAST TECHNIQUE: Multidetector CT imaging of the head and cervical spine was performed following the standard protocol without intravenous contrast. Multiplanar CT image reconstructions of the cervical spine were also generated. COMPARISON:  None. FINDINGS: CT HEAD FINDINGS Brain: No evidence of acute infarction, hemorrhage, hydrocephalus, extra-axial collection or mass lesion/mass effect. Mild volume loss of the brain. Vascular: Calcific atherosclerosis of the internal carotid arteries. No hyperdense vessel. Skull: Mild contusion of left  posterior scalp. No calvarial fracture. Sinuses/Orbits: Moderate diffuse paranasal sinus mucosal thickening. Normal aeration of the mastoid air cells. Other: None. CT CERVICAL SPINE FINDINGS Alignment: Straightening of cervical lordosis without listhesis. Skull base and vertebrae: No acute fracture. No primary bone lesion or focal pathologic process. Soft tissues and spinal canal: No prevertebral fluid or swelling. No visible canal hematoma. Disc levels: Moderate cervical spondylosis with predominantly discogenic degenerative changes. Uncovertebral and facet hypertrophy encroach on the neural foramen at the bilateral C3-4, bilateral C4-5, bilateral C5-6, and bilateral C6-7 levels. No high-grade bony spinal canal stenosis. Upper chest: Mild biapical pleuroparenchymal scarring. Other: Negative. IMPRESSION: 1. Small contusion of left posterior scalp. No calvarial fracture. 2. No acute intracranial abnormality. 3. Mild brain parenchymal volume loss. 4. Moderate paranasal sinus disease. 5. No acute fracture or dislocation of the cervical spine. 6. Moderate cervical  spondylosis. No high-grade bony canal stenosis. Electronically Signed   By: Mitzi HansenLance  Furusawa-Stratton M.D.   On: 11/02/2018 23:50   Ct Cervical Spine Wo Contrast  Result Date: 11/02/2018 CLINICAL DATA:  78 y/o M; syncopal episode with loss of consciousness hitting the left side of head. EXAM: CT HEAD WITHOUT CONTRAST CT CERVICAL SPINE WITHOUT CONTRAST TECHNIQUE: Multidetector CT imaging of the head and cervical spine was performed following the standard protocol without intravenous contrast. Multiplanar CT image reconstructions of the cervical spine were also generated. COMPARISON:  None. FINDINGS: CT HEAD FINDINGS Brain: No evidence of acute infarction, hemorrhage, hydrocephalus, extra-axial collection or mass lesion/mass effect. Mild volume loss of the brain. Vascular: Calcific atherosclerosis of the internal carotid arteries. No hyperdense vessel. Skull: Mild contusion of left posterior scalp. No calvarial fracture. Sinuses/Orbits: Moderate diffuse paranasal sinus mucosal thickening. Normal aeration of the mastoid air cells. Other: None. CT CERVICAL SPINE FINDINGS Alignment: Straightening of cervical lordosis without listhesis. Skull base and vertebrae: No acute fracture. No primary bone lesion or focal pathologic process. Soft tissues and spinal canal: No prevertebral fluid or swelling. No visible canal hematoma. Disc levels: Moderate cervical spondylosis with predominantly discogenic degenerative changes. Uncovertebral and facet hypertrophy encroach on the neural foramen at the bilateral C3-4, bilateral C4-5, bilateral C5-6, and bilateral C6-7 levels. No high-grade bony spinal canal stenosis. Upper chest: Mild biapical pleuroparenchymal scarring. Other: Negative. IMPRESSION: 1. Small contusion of left posterior scalp. No calvarial fracture. 2. No acute intracranial abnormality. 3. Mild brain parenchymal volume loss. 4. Moderate paranasal sinus disease. 5. No acute fracture or dislocation of the cervical  spine. 6. Moderate cervical spondylosis. No high-grade bony canal stenosis. Electronically Signed   By: Mitzi HansenLance  Furusawa-Stratton M.D.   On: 11/02/2018 23:50   Dg Chest Portable 1 View  Result Date: 11/02/2018 CLINICAL DATA:  78 year old male status post fall. EXAM: PORTABLE CHEST 1 VIEW COMPARISON:  12/05/2016 and earlier. FINDINGS: Portable AP semi upright view at 2241 hours. Stable low lung volumes. Mediastinal contours remain within normal limits. Visualized tracheal air column is within normal limits. Mild chronic increased interstitial markings are stable. No pneumothorax, pleural effusion or acute pulmonary opacity. No acute osseous abnormality identified. IMPRESSION: No acute cardiopulmonary abnormality or acute traumatic injury identified. Electronically Signed   By: Odessa FlemingH  Hall M.D.   On: 11/02/2018 22:57    Microbiology: No results found for this or any previous visit (from the past 240 hour(s)).   Labs: Basic Metabolic Panel: Recent Labs  Lab 11/02/18 2221 11/03/18 0459  NA 142 141  K 4.4 4.5  CL 111 110  CO2 24 22  GLUCOSE 116* 94  BUN  23 27*  CREATININE 2.00* 2.07*  CALCIUM 8.7* 8.5*   Liver Function Tests: No results for input(s): AST, ALT, ALKPHOS, BILITOT, PROT, ALBUMIN in the last 168 hours. No results for input(s): LIPASE, AMYLASE in the last 168 hours. No results for input(s): AMMONIA in the last 168 hours. CBC: Recent Labs  Lab 11/02/18 2221  WBC 7.8  HGB 15.1  HCT 48.5  MCV 76.4*  PLT 163   Cardiac Enzymes: No results for input(s): CKTOTAL, CKMB, CKMBINDEX, TROPONINI in the last 168 hours. BNP: BNP (last 3 results) No results for input(s): BNP in the last 8760 hours.  ProBNP (last 3 results) No results for input(s): PROBNP in the last 8760 hours.  CBG: Recent Labs  Lab 11/02/18 2222 11/03/18 1235  GLUCAP 128* 133*       Signed:  Rhetta MuraJai-Gurmukh Rasheda Ledger Stanley Sullivan   [facility] 11/03/2018, 3:45 PM

## 2018-11-03 NOTE — Clinical Social Work Note (Signed)
Will need walking stats in order to see if pt qualifies for home 02.  [place], [place]

## 2018-11-03 NOTE — Discharge Instructions (Signed)
Syncope  Syncope refers to a condition in which a person temporarily loses consciousness. Syncope may also be called fainting or passing out. It is caused by a sudden decrease in blood flow to the brain. Even though most causes of syncope are not dangerous, syncope can be a sign of a serious medical problem. Your health care provider may do tests to find the reason why you are having syncope. Signs that you may be about to faint include:  Feeling dizzy or light-headed.  Feeling nauseous.  Seeing all white or all black in your field of vision.  Having cold, clammy skin. If you faint, get medical help right away. Call your local emergency services (911 in the [place] Do not drive yourself to the hospital. Follow these instructions at home: Pay attention to any changes in your symptoms. Take these actions to stay safe and to help relieve your symptoms: Lifestyle  Do not drive, use machinery, or play sports until your health care provider says it is okay.  Do not drink alcohol.  Do not use any products that contain nicotine or tobacco, such as cigarettes and e-cigarettes. If you need help quitting, ask your health care provider.  Drink enough fluid to keep your urine pale yellow. General instructions  Take over-the-counter and prescription medicines only as told by your health care provider.  If you are taking blood pressure or heart medicine, get up slowly and take several minutes to sit and then stand. This can reduce dizziness or light-headedness.  Have someone stay with you until you feel stable.  If you start to feel like you might faint, lie down right away and raise (elevate) your feet above the level of your heart. Breathe deeply and steadily. Wait until all the symptoms have passed.  Keep all follow-up visits as told by your health care provider. This is important. Get help right away if you:  Have a severe headache.  Faint once or repeatedly.  Have pain in your chest,  abdomen, or back.  Have a very fast or irregular heartbeat (palpitations).  Have pain when you breathe.  Are bleeding from your mouth or rectum, or you have black or tarry stool.  Have a seizure.  Are confused.  Have trouble walking.  Have severe weakness.  Have vision problems. These symptoms may represent a serious problem that is an emergency. Do not wait to see if your symptoms will go away. Get medical help right away. Call your local emergency services (911 in the [place] Do not drive yourself to the hospital. Summary  Syncope refers to a condition in which a person temporarily loses consciousness. It is caused by a sudden decrease in blood flow to the brain.  Signs that you may be about to faint include dizziness, feeling light-headed, feeling nauseous, sudden vision changes, or cold, clammy skin.  Although most causes of syncope are not dangerous, syncope can be a sign of a serious medical problem. If you faint, get medical help right away. This information is not intended to replace advice given to you by your health care provider. Make sure you discuss any questions you have with your health care provider. Document Released: 04/16/2005 Document Revised: 03/29/2017 Document Reviewed: 03/25/2017 [employer]vier Patient Education  2020 [employer]  

## 2018-11-03 NOTE — Consult Note (Signed)
Advanced Heart Failure Team Consult Note   Primary Physician: Wilson Singer, MD PCP-Cardiologist:  No primary care provider on file.  Reason for Consultation: Syncope, abnormal EKG  HPI:    Stanley Sullivan is seen today for evaluation of syncope with an abnormal EKG at the request of the emergency room and internal medicine.  He is a 78 year old man with a history of interstitial lung disease requiring at least 3 L of home oxygen sleep apnea type 2 diabetes CKD stage III and hypertension who is admitted by the hospitalist service for syncopal episode.  This evening he was drinking a few glasses of wine and had taken off his oxygen and was walking around when he passed out in the bathroom.  This event was noticed by his family members.  He regained consciousness after a few seconds and then put on his oxygen.  He did not have any chest pain or lightheadedness but he felt short of breath before passing out and he was actually try to put on his oxygen before he did pass out.  He hit his head before coming to the ground.  Head CT shows a left-sided posterior contusion but no bleeding in the brain.  Consult is for an abnormal EKG that demonstrates normal sinus rhythm with right axis deviation and diffuse T wave inversions in the inferior and precordial leads.  EKG does not meet criteria for RVH however with the history of interstitial lung disease requiring oxygen it is suggestive of right-sided enlargement.  Prior EKG in the system is from 2014 which does not show right axis deviation or similar changes as tonight.  There is no substernal chest pain he does have some tenderness of the left-sided chest seemingly related to his fall.   Review of Systems: [y] = yes,  = no   . General: Weight gain ; Weight loss ; Anorexia ; Fatigue ; Fever ; Chills ; Weakness   . Cardiac: Chest pain/pressure ; Resting SOB ; Exertional SOB [yes]; Orthopnea ; Pedal Edema ;  Palpitations ; Syncope [yes]; Presyncope ; Paroxysmal nocturnal dyspnea[ ]   . Pulmonary: Cough ; Wheezing[ ] ; Hemoptysis[ ] ; Sputum ; Snoring   . GI: Vomiting[ ] ; Dysphagia[ ] ; Melena[ ] ; Hematochezia ; Heartburn[ ] ; Abdominal pain ; Constipation ; Diarrhea ; BRBPR   . GU: Hematuria[ ] ; Dysuria ; Nocturia[ ]   . Vascular: Pain in legs with walking ; Pain in feet with lying flat ; Non-healing sores ; Stroke ; TIA ; Slurred speech ;  . Neuro: Headaches[ ] ; Vertigo[ ] ; Seizures[ ] ; Paresthesias[ ] ;Blurred vision ; Diplopia ; Vision changes   . Ortho/Skin: Arthritis ; Joint pain ; Muscle pain ; Joint swelling ; Back Pain ; Rash   . Psych: Depression[ ] ; Anxiety[ ]   . Heme: Bleeding problems ; Clotting disorders ; Anemia   . Endocrine: Diabetes ; Thyroid dysfunction[ ]   Home Medications Prior to Admission medications   Medication Sig Start Date End Date Taking? Authorizing Provider  acetaminophen (TYLENOL) 325 MG tablet Take 650 mg by mouth. Take two tablets (650 mg) every 6 hours as needed for pain   Yes [provider]  albuterol (PROVENTIL HFA;VENTOLIN HFA) 108 (90 Base) MCG/ACT inhaler Inhale 2 puffs into the lungs.  Into lungs every 6 hours as needed for wheezing or shortness of breath or cough   Yes [provider]  allopurinol (ZYLOPRIM) 100 MG tablet Take 1 tablet (100 mg total) by mouth daily. For gout 10/13/15  Yes Reed, Tiffany L, DO  atorvastatin (LIPITOR) 80 MG tablet TAKE ONE TABLET BY MOUTH ONE TIME DAILY  Patient taking differently: Take 80 mg by mouth daily.  03/04/17  Yes Kirt Boysarter, Monica, DO  cetirizine (KLS ALLER-TEC) 10 MG tablet Take 10 mg by mouth daily as needed (for allergies.).    Yes [provider]  furosemide (LASIX) 20 MG tablet Take 1 tablet (20 mg total) by mouth daily. For swelling of legs 07/18/15  Yes Reed, Tiffany L, DO  losartan (COZAAR) 100 MG tablet TAKE  ONE TABLET BY MOUTH ONE TIME DAILY  Patient taking differently: Take 100 mg by mouth daily.  03/04/17  Yes Montez Moritaarter, Monica, DO  metoprolol succinate (TOPROL-XL) 100 MG 24 hr tablet TAKE 1 TABLET (100 MG TOTAL) BY MOUTH DAILY. 06/25/16  Yes Reed, Tiffany L, DO  Multiple Vitamin (MULTIVITAMIN WITH MINERALS) TABS tablet Take 1 tablet by mouth daily.   Yes [provider]  polyvinyl alcohol (LUBRICANT DROPS) 1.4 % ophthalmic solution Place 1 drop into both eyes 3 (three) times daily as needed for dry eyes.   Yes [provider]  tamsulosin (FLOMAX) 0.4 MG CAPS capsule TAKE 1 CAPSULE BY MOUTH ONCE A DAY FOR YOUR PROSTATE Patient taking differently: Take 0.4 mg by mouth daily after supper.  04/16/16  Yes Reed, Tiffany L, DO  aspirin 325 MG tablet Take 1 tablet (325 mg total) by mouth 2 (two) times daily after a meal. Patient not taking: Reported on 11/03/2018 12/06/16   Jetty PeeksPetrarca, Brian D, PA-C  HYDROcodone-acetaminophen (NORCO/VICODIN) 5-325 MG tablet Take 1 tablet by mouth every 6 (six) hours as needed for moderate pain. Patient not taking: Reported on 11/03/2018 12/06/16   Reece PackerPetrarca, Brian D, PA-C    Past Medical History: Past Medical History:  Diagnosis Date  . Allergy, unspecified not elsewhere classified   . Benign prostatic hypertrophy 08/24/2013  . Chronic kidney disease, stage III (moderate) (HCC)    patient denies  . Chronic respiratory failure with hypoxia (HCC) 12/05/2016  . Dyspnea on exertion 04/10/2013  . External hemorrhoids without mention of complication   . Gout, unspecified   . Hypertension   . Hypertrophy of prostate without urinary obstruction and other lower urinary tract symptoms (LUTS)   . Interstitial lung disease (HCC) 10/29/2014  . Maculopapular rash, generalized 10/02/2012  . Obesity, unspecified   . On home oxygen therapy    in the morning and evening, with exertion, as needed when O2 drops to 80s  . Organic sleep apnea, unspecified    CPAP, presure setting 3  .  OSA (obstructive sleep apnea) 10/09/2013  . Osteoarthrosis, unspecified whether generalized or localized, unspecified site   . Other abnormal glucose   . Other and unspecified hyperlipidemia   . Other atopic dermatitis and related conditions   . Other specified visual disturbances   . Pain in joint, lower leg   . Pain in limb   . Primary osteoarthritis of right knee 10/29/2014  . 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 Type II or unspecified type diabetes mellitus without mention of complication, not stated as uncontrolled 08/24/2013  . Unspecified disorder of kidney and ureter   . Unspecified essential hypertension   . Urticaria, unspecified  Past Surgical History: Past Surgical History:  Procedure Laterality Date  . CATARACT EXTRACTION    . JOINT REPLACEMENT    . TOTAL KNEE ARTHROPLASTY Right 12/04/2016   Procedure: RIGHT TOTAL KNEE ARTHROPLASTY;  Surgeon: Valeria BatmanWhitfield, Peter W, MD;  [facility];  Service: Orthopedics;  Laterality: Right;    Family History: No family history on file.  Social History: Social History   Socioeconomic History  . Marital status: Married    Spouse name: Not on file  . Number of children: Not on file  . Years of education: Not on file  . Highest education level: Not on file  Occupational History  . Occupation: retired Paediatric nursengineer  Social Needs  . Financial resource strain: Not on file  . Food insecurity    Worry: Not on file    Inability: Not on file  . Transportation needs    Medical: Not on file    Non-medical: Not on file  Tobacco Use  . Smoking status: Former Smoker    Packs/day: 0.50    Years: 15.00    Pack years: 7.50    Types: Cigarettes    Quit date: 04/30/1993    Years since quitting: 25.5  . Smokeless tobacco: Former NeurosurgeonUser    Types: Chew  Substance and Sexual Activity  . Alcohol use: Yes    Comment: occassional  . Drug use: No  . Sexual activity: Not on file  Lifestyle  . Physical activity    Days per  week: Not on file    Minutes per session: Not on file  . Stress: Not on file  Relationships  . Social Musicianconnections    Talks on phone: Not on file    Gets together: Not on file    Attends religious service: Not on file    Active member of club or organization: Not on file    Attends meetings of clubs or organizations: Not on file    Relationship status: Not on file  Other Topics Concern  . Not on file  Social History Narrative   [facility]06[facility]   Married - [name]   Former smoker -stopped 1995   Former chewing    Alcohol occasionally   Full Code        Allergies:  Allergies  Allergen Reactions  . No Known Allergies     Objective:    Vital Signs:   Temp:  [97.8 F (36.6 C)] 97.8 F (36.6 C) (07/05 2200) Pulse Rate:  [50-59] 50 (07/06 0330) Resp:  [16-23] 16 (07/06 0330) BP: (163-193)/(78-98) 193/84 (07/06 0330) SpO2:  [93 %-97 %] 97 % (07/06 0330)    Weight change: There were no vitals filed for this visit.  Intake/Output:  No intake or output data in the 24 hours ending 11/03/18 0345    Physical Exam    General:  Well appearing. No resp difficulty HEENT: normal Neck: supple. JVP . Carotids 2+ bilat; no bruits. No lymphadenopathy or thyromegaly appreciated. Cor: PMI nondisplaced. Regular rate & rhythm. No rubs, gallops or murmurs. Lungs: Rare dry scattered crackles, mildly decreased breath sounds at the bases bilaterally Abdomen: soft, nontender, nondistended. No hepatosplenomegaly. No bruits or masses. Good bowel sounds. Extremities: no cyanosis, clubbing, rash, edema Neuro: alert & orientedx3, cranial nerves grossly intact. moves all 4 extremities w/o difficulty. Affect pleasant    EKG    Normal sinus rhythm with right axis deviation and T wave inversions in the inferior and precordial leads  Labs  Basic Metabolic Panel: Recent Labs  Lab 11/02/18 2221  NA 142  K 4.4  CL 111  CO2 24  GLUCOSE 116*  BUN 23  CREATININE  2.00*  CALCIUM 8.7*    Liver Function Tests: No results for input(s): AST, ALT, ALKPHOS, BILITOT, PROT, ALBUMIN in the last 168 hours. No results for input(s): LIPASE, AMYLASE in the last 168 hours. No results for input(s): AMMONIA in the last 168 hours.  CBC: Recent Labs  Lab 11/02/18 2221  WBC 7.8  HGB 15.1  HCT 48.5  MCV 76.4*  PLT 163    Cardiac Enzymes: No results for input(s): CKTOTAL, CKMB, CKMBINDEX, TROPONINI in the last 168 hours.  BNP: BNP (last 3 results) No results for input(s): BNP in the last 8760 hours.  ProBNP (last 3 results) No results for input(s): PROBNP in the last 8760 hours.   CBG: Recent Labs  Lab 11/02/18 2222  GLUCAP 128*    Coagulation Studies: No results for input(s): LABPROT, INR in the last 72 hours.   Imaging   Ct Head Wo Contrast  Result Date: 11/02/2018 CLINICAL DATA:  78 y/o M; syncopal episode with loss of consciousness hitting the left side of head. EXAM: CT HEAD WITHOUT CONTRAST CT CERVICAL SPINE WITHOUT CONTRAST TECHNIQUE: Multidetector CT imaging of the head and cervical spine was performed following the standard protocol without intravenous contrast. Multiplanar CT image reconstructions of the cervical spine were also generated. COMPARISON:  None. FINDINGS: CT HEAD FINDINGS Brain: No evidence of acute infarction, hemorrhage, hydrocephalus, extra-axial collection or mass lesion/mass effect. Mild volume loss of the brain. Vascular: Calcific atherosclerosis of the internal carotid arteries. No hyperdense vessel. Skull: Mild contusion of left posterior scalp. No calvarial fracture. Sinuses/Orbits: Moderate diffuse paranasal sinus mucosal thickening. Normal aeration of the mastoid air cells. Other: None. CT CERVICAL SPINE FINDINGS Alignment: Straightening of cervical lordosis without listhesis. Skull base and vertebrae: No acute fracture. No primary bone lesion or focal pathologic process. Soft tissues and spinal canal: No prevertebral  fluid or swelling. No visible canal hematoma. Disc levels: Moderate cervical spondylosis with predominantly discogenic degenerative changes. Uncovertebral and facet hypertrophy encroach on the neural foramen at the bilateral C3-4, bilateral C4-5, bilateral C5-6, and bilateral C6-7 levels. No high-grade bony spinal canal stenosis. Upper chest: Mild biapical pleuroparenchymal scarring. Other: Negative. IMPRESSION: 1. Small contusion of left posterior scalp. No calvarial fracture. 2. No acute intracranial abnormality. 3. Mild brain parenchymal volume loss. 4. Moderate paranasal sinus disease. 5. No acute fracture or dislocation of the cervical spine. 6. Moderate cervical spondylosis. No high-grade bony canal stenosis. Electronically Signed   By: Kristine Garbe M.D.   On: 11/02/2018 23:50   Ct Cervical Spine Wo Contrast  Result Date: 11/02/2018 CLINICAL DATA:  78 y/o M; syncopal episode with loss of consciousness hitting the left side of head. EXAM: CT HEAD WITHOUT CONTRAST CT CERVICAL SPINE WITHOUT CONTRAST TECHNIQUE: Multidetector CT imaging of the head and cervical spine was performed following the standard protocol without intravenous contrast. Multiplanar CT image reconstructions of the cervical spine were also generated. COMPARISON:  None. FINDINGS: CT HEAD FINDINGS Brain: No evidence of acute infarction, hemorrhage, hydrocephalus, extra-axial collection or mass lesion/mass effect. Mild volume loss of the brain. Vascular: Calcific atherosclerosis of the internal carotid arteries. No hyperdense vessel. Skull: Mild contusion of left posterior scalp. No calvarial fracture. Sinuses/Orbits: Moderate diffuse paranasal sinus mucosal thickening. Normal aeration of the mastoid air cells. Other: None. CT CERVICAL SPINE FINDINGS Alignment: Straightening of cervical lordosis without  listhesis. Skull base and vertebrae: No acute fracture. No primary bone lesion or focal pathologic process. Soft tissues and spinal  canal: No prevertebral fluid or swelling. No visible canal hematoma. Disc levels: Moderate cervical spondylosis with predominantly discogenic degenerative changes. Uncovertebral and facet hypertrophy encroach on the neural foramen at the bilateral C3-4, bilateral C4-5, bilateral C5-6, and bilateral C6-7 levels. No high-grade bony spinal canal stenosis. Upper chest: Mild biapical pleuroparenchymal scarring. Other: Negative. IMPRESSION: 1. Small contusion of left posterior scalp. No calvarial fracture. 2. No acute intracranial abnormality. 3. Mild brain parenchymal volume loss. 4. Moderate paranasal sinus disease. 5. No acute fracture or dislocation of the cervical spine. 6. Moderate cervical spondylosis. No high-grade bony canal stenosis. Electronically Signed   By: Mitzi HansenLance  Furusawa-Stratton M.D.   On: 11/02/2018 23:50   Dg Chest Portable 1 View  Result Date: 11/02/2018 CLINICAL DATA:  78 year old male status post fall. EXAM: PORTABLE CHEST 1 VIEW COMPARISON:  12/05/2016 and earlier. FINDINGS: Portable AP semi upright view at 2241 hours. Stable low lung volumes. Mediastinal contours remain within normal limits. Visualized tracheal air column is within normal limits. Mild chronic increased interstitial markings are stable. No pneumothorax, pleural effusion or acute pulmonary opacity. No acute osseous abnormality identified. IMPRESSION: No acute cardiopulmonary abnormality or acute traumatic injury identified. Electronically Signed   By: Odessa FlemingH  Hall M.D.   On: 11/02/2018 22:57      Medications:     Current Medications: . sodium chloride flush  3 mL Intravenous Once    Assessment/Plan  78 year old man with a history of interstitial lung disease on home oxygen 3 L/day, CKD stage III, hypertension, type 2 diabetes who was admitted by the hospitalist service for syncope with an abnormal EKG.  Troponins have been negative x2 here in the ED EKG about 3 hours apart is consistent.  It is sinus rhythm with right  axis deviation and diffuse T wave inversions in the inferior and precordial leads. The EKG does not meet RVH criteria however considering the patient's interstitial lung disease and requirements of home oxygen, the right axis deviation and R/S of close to 1 in lead V1 suggests right-sided enlargement.  Recommend a 2D echocardiogram to evaluate structure and function including pulmonary hypertension along with right-sided enlargement.  Thank you for the consultation.  Length of Stay: 0  Stanley BrayJohn C Scout Gumbs, MD  Cardiology 11/03/2018, 3:45 AM

## 2018-11-03 NOTE — Care Management Obs Status (Signed)
[place] NOTIFICATION   Patient Details  Name: Stanley Sullivan MRN: [id number] Date of Birth: 07/17/1940   Medicare Observation Status Notification Given:  Yes  Letter emailed to patients son at [email or web address].  Carmela Piechowski A Garrick Midgley, LCSW 11/03/2018, 4:04 PM

## 2018-11-04 LAB — NOVEL CORONAVIRUS, NAA (HOSP ORDER, SEND-OUT TO REF LAB; TAT 18-24 HRS): SARS-CoV-2, NAA: NOT DETECTED

## 2018-11-05 ENCOUNTER — Telehealth: Payer: Self-pay

## 2018-11-05 NOTE — Telephone Encounter (Signed)
Patient wife came by to drop of medical clearance form for a dental procedure. They would like a call. Once form is completed they would like it faxed to his Dentist (Dr. Jeronimo Greaves)  [phone]. P [phone]. Wife's contact number is [phone]. Form placed in MR box.

## 2018-11-05 NOTE — Telephone Encounter (Signed)
Pt has not been seen at office since 11/13/17.  Called and spoke with pt's wife letting her know that we needed to schedule pt for a surgical clearance visit since it has been a year since we have seen pt and she verbalized understanding. Visit scheduled for pt tomorrow, 7/9 at [facility] screen negative. Nothing further needed.

## 2018-11-06 ENCOUNTER — Encounter: Payer: Self-pay | Admitting: Pulmonary Disease

## 2018-11-06 ENCOUNTER — Ambulatory Visit: Payer: Medicare Other | Admitting: Pulmonary Disease

## 2018-11-06 ENCOUNTER — Other Ambulatory Visit: Payer: Self-pay

## 2018-11-06 DIAGNOSIS — Z7189 Other specified counseling: Secondary | ICD-10-CM | POA: Diagnosis not present

## 2018-11-06 DIAGNOSIS — G4733 Obstructive sleep apnea (adult) (pediatric): Secondary | ICD-10-CM | POA: Diagnosis not present

## 2018-11-06 DIAGNOSIS — J9611 Chronic respiratory failure with hypoxia: Secondary | ICD-10-CM | POA: Diagnosis not present

## 2018-11-06 DIAGNOSIS — J849 Interstitial pulmonary disease, unspecified: Secondary | ICD-10-CM | POA: Diagnosis not present

## 2018-11-06 NOTE — Assessment & Plan Note (Addendum)
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, scattered stable considered benign pulmonary nodules 2017 pulmonary function test showing FVC of 2.55 (76% predicted), DLCO 32 Patient was last seen in our office in 2019 when he was was to complete a 61-month follow-up with a high-resolution CT chest as well as spirometry with DLCO Patient spouse who is with him today reports that they completed a CT with [facility] we do not have these records Patient maintained on 3 L continuous Breath sounds clear to auscultation today  Plan: We will request records from [facility] We will request CT results from [facility], if not a high resolution chest CT done within the last 6 months we will need to repeat We will schedule patient for 4-week follow-up with Dr. Chase Caller with spirometry and DLCO

## 2018-11-06 NOTE — Addendum Note (Signed)
Addended by: Parke Poisson E on: 11/06/2018 01:38 PM   Modules accepted: Orders

## 2018-11-06 NOTE — Assessment & Plan Note (Signed)
Plan: Continue CPAP 

## 2018-11-06 NOTE — Assessment & Plan Note (Signed)
Plan: Okay to complete dental procedure for local anesthetic and extractions Low risk for pulmonary complications due to procedure being with local anesthetic Patient still needs to complete follow-up with primary care next week as patient is status post hospitalization     Peri-operative Assessment of Pulmonary Risk for Non-Thoracic Surgery:  ForMr. Bridwell, risk of perioperative pulmonary complications is increased by:  Age greater than 46 years  Known ILD  Chronic respiratory failure  Respiratory complications generally occur in 1% of ASA Class I patients, 5% of ASA Class II and 10% of ASA Class III-IV patients These complications rarely result in mortality and iclude postoperative pneumonia, atelectasis, pulmonary embolism, ARDS and increased time requiring postoperative mechanical ventilation.  Overall, I recommend proceeding with the surgery if the risk for respiratory complications are outweighed by the potential benefits. This will need to be discussed between the patient and surgeon.  To reduce risks of respiratory complications, I recommend: --Pre- and post-operative incentive spirometry performed frequently while awake --Continue oxygen as already prescribed --Avoiding use of pancuronium during anesthesia.  I have discussed the risk factors and recommendations above with the patient.

## 2018-11-06 NOTE — Assessment & Plan Note (Signed)
Assessment: 2017 spirometry with DLCO shows a DLCO of 32 2017 high-resolution CT chest results shows potential NSIP, questionable scattered subpleural reticular changes Walk today in office patient required 6 L continuous  Plan: Oxygen setting should be 3 L continuous at rest, 6 L with exertion Patient reporting he [facility] we will request those results 4-week follow-up with Dr. Chase Caller

## 2018-11-06 NOTE — Progress Notes (Signed)
  ID: Stanley Sullivan, male    DOB: 05-08-1940, 78 y.o.   MRN: [id number]  Chief Complaint  Patient presents with  . Follow-up    Surgical clearance, last seen 2019, recent hospitalization    Referring provider: Wilson Singer, MD  HPI:  78 year old male former smoker followed in our office for interstitial lung disease  PMH:  Smoker/ Smoking History: Former smoker.  Quit smoking 1995.  7.5 pack years. Maintenance: None Pt of: Dr. Marchelle Gearing  11/06/2018  - Visit   78 year old male former smoker followed in our office for ILD.  Patient was last seen in our office in 2019.  Patient was instructed at that office visit and a follow-up after knee surgeries in 2 months with a breathing test.  Patient also has an ordered high-resolution CT chest that has not been completed.  Patient is presenting to our office today not for acute symptoms but for surgical clearance to have a dental procedure.  Patient is preparing to have an extraction by [facility].  Patient was also recently discharged from the hospital on 11/03/2018 for evaluation of syncope.  Patient was in the hospital for 1 day.  They change the patient's metoprolol dose from 100 mg daily to 50 mg daily.  Patient has planned follow-up with [facility] his PCP next week.  Patient is also reporting today that they have had follow-ups with [facility] and also that [facility] has completed a CT.  We do not have any records of this.  Patient is due to have a high resolution chest CT completed.  Patient is also due to have spirometry with DLCO.  These items are past due more expected to be completed in 2019.  We will have to request records from [facility] today.  Patient and patient's wife are reporting no issues with her breathing.  Patient still maintained on 3 L continuous.  Chart review:   11/02/2018- hospitalization-syncope Discharge date: 11/03/2018 Plan: Follow-up with  outpatient primary care provider, cardiology, pulmonary Change metoprolol dosing to 50 mg daily, needs repeat blood work outpatient, repeat high-resolution CT scan  Tests:   11/03/2018-SARS-CoV-2-negative  03/19/2016-CT chest high-res-questionable scattered subpleural reticular changes unchanged from 02/10/2015, difficult to exclude NSIP, scattered pulmonary nodules are unchanged and considered benign, aortic arthrosclerosis  03/20/2016- pulmonary function test- office spirometry with DLCO-FVC 2.55 (76% predicted), ratio 78, FEV1 2 (78% predicted), DLCO 32  SIX MIN WALK 02/01/2015 11/10/2013 10/07/2013  Supplimental Oxygen during Test? (L/min) No No No  Tech Comments: - pt did not complete due to sats 86% pt did not complete due to sats dropping to 84%     FENO:  No results found for: NITRICOXIDE  PFT: PFT Results Latest Ref Rng & Units 03/20/2016 10/08/2013  FVC-Pre L 2.55 2.27  FVC-Predicted Pre % 76 65  FVC-Post L - 2.28  FVC-Predicted Post % - 66  Pre FEV1/FVC % % 78 80  Post FEV1/FCV % % - 85  FEV1-Pre L 2.00 1.82  FEV1-Predicted Pre % 78 73  FEV1-Post L - 1.94  DLCO UNC% % 32 25  DLCO COR %Predicted % 71 48  TLC L - 3.64  TLC % Predicted % - 60  RV % Predicted % - 53    Imaging: Ct Head Wo Contrast  Result Date: 11/02/2018 CLINICAL DATA:  78 y/o M; syncopal episode with loss of consciousness hitting the left side of head. EXAM: CT HEAD WITHOUT CONTRAST CT CERVICAL  SPINE WITHOUT CONTRAST TECHNIQUE: Multidetector CT imaging of the head and cervical spine was performed following the standard protocol without intravenous contrast. Multiplanar CT image reconstructions of the cervical spine were also generated. COMPARISON:  None. FINDINGS: CT HEAD FINDINGS Brain: No evidence of acute infarction, hemorrhage, hydrocephalus, extra-axial collection or mass lesion/mass effect. Mild volume loss of the brain. Vascular: Calcific atherosclerosis of the internal carotid arteries. No  hyperdense vessel. Skull: Mild contusion of left posterior scalp. No calvarial fracture. Sinuses/Orbits: Moderate diffuse paranasal sinus mucosal thickening. Normal aeration of the mastoid air cells. Other: None. CT CERVICAL SPINE FINDINGS Alignment: Straightening of cervical lordosis without listhesis. Skull base and vertebrae: No acute fracture. No primary bone lesion or focal pathologic process. Soft tissues and spinal canal: No prevertebral fluid or swelling. No visible canal hematoma. Disc levels: Moderate cervical spondylosis with predominantly discogenic degenerative changes. Uncovertebral and facet hypertrophy encroach on the neural foramen at the bilateral C3-4, bilateral C4-5, bilateral C5-6, and bilateral C6-7 levels. No high-grade bony spinal canal stenosis. Upper chest: Mild biapical pleuroparenchymal scarring. Other: Negative. IMPRESSION: 1. Small contusion of left posterior scalp. No calvarial fracture. 2. No acute intracranial abnormality. 3. Mild brain parenchymal volume loss. 4. Moderate paranasal sinus disease. 5. No acute fracture or dislocation of the cervical spine. 6. Moderate cervical spondylosis. No high-grade bony canal stenosis. Electronically Signed   By: Mitzi HansenLance  Furusawa-Stratton M.D.   On: 11/02/2018 23:50   Ct Cervical Spine Wo Contrast  Result Date: 11/02/2018 CLINICAL DATA:  78 y/o M; syncopal episode with loss of consciousness hitting the left side of head. EXAM: CT HEAD WITHOUT CONTRAST CT CERVICAL SPINE WITHOUT CONTRAST TECHNIQUE: Multidetector CT imaging of the head and cervical spine was performed following the standard protocol without intravenous contrast. Multiplanar CT image reconstructions of the cervical spine were also generated. COMPARISON:  None. FINDINGS: CT HEAD FINDINGS Brain: No evidence of acute infarction, hemorrhage, hydrocephalus, extra-axial collection or mass lesion/mass effect. Mild volume loss of the brain. Vascular: Calcific atherosclerosis of the  internal carotid arteries. No hyperdense vessel. Skull: Mild contusion of left posterior scalp. No calvarial fracture. Sinuses/Orbits: Moderate diffuse paranasal sinus mucosal thickening. Normal aeration of the mastoid air cells. Other: None. CT CERVICAL SPINE FINDINGS Alignment: Straightening of cervical lordosis without listhesis. Skull base and vertebrae: No acute fracture. No primary bone lesion or focal pathologic process. Soft tissues and spinal canal: No prevertebral fluid or swelling. No visible canal hematoma. Disc levels: Moderate cervical spondylosis with predominantly discogenic degenerative changes. Uncovertebral and facet hypertrophy encroach on the neural foramen at the bilateral C3-4, bilateral C4-5, bilateral C5-6, and bilateral C6-7 levels. No high-grade bony spinal canal stenosis. Upper chest: Mild biapical pleuroparenchymal scarring. Other: Negative. IMPRESSION: 1. Small contusion of left posterior scalp. No calvarial fracture. 2. No acute intracranial abnormality. 3. Mild brain parenchymal volume loss. 4. Moderate paranasal sinus disease. 5. No acute fracture or dislocation of the cervical spine. 6. Moderate cervical spondylosis. No high-grade bony canal stenosis. Electronically Signed   By: Mitzi HansenLance  Furusawa-Stratton M.D.   On: 11/02/2018 23:50   Dg Chest Portable 1 View  Result Date: 11/02/2018 CLINICAL DATA:  78 year old male status post fall. EXAM: PORTABLE CHEST 1 VIEW COMPARISON:  12/05/2016 and earlier. FINDINGS: Portable AP semi upright view at 2241 hours. Stable low lung volumes. Mediastinal contours remain within normal limits. Visualized tracheal air column is within normal limits. Mild chronic increased interstitial markings are stable. No pneumothorax, pleural effusion or acute pulmonary opacity. No acute osseous abnormality identified. IMPRESSION: No  acute cardiopulmonary abnormality or acute traumatic injury identified. Electronically Signed   By: Odessa FlemingH  Hall M.D.   On: 11/02/2018  22:57      Specialty Problems      Pulmonary Problems   Dyspnea on exertion   Cough   Dyspnea   ILD (interstitial lung disease) (HCC)    03/19/2016-CT chest high-res-questionable scattered subpleural reticular changes unchanged from 02/10/2015, difficult to exclude NSIP, scattered pulmonary nodules are unchanged and considered benign, aortic arthrosclerosis  03/20/2016- pulmonary function test- office spirometry with DLCO-FVC 2.55 (76% predicted), ratio 78, FEV1 2 (78% predicted), DLCO 32       OSA (obstructive sleep apnea)    [facility]      Interstitial lung disease (HCC)   Chronic respiratory failure with hypoxia (HCC)      Allergies  Allergen Reactions  . No Known Allergies     Immunization History  Administered Date(s) Administered  . Influenza Split 12/13/2014  . Influenza Whole 02/14/2007, 01/17/2013  . Influenza,inj,Quad PF,6+ Mos 01/29/2014, 12/26/2015  . Pneumococcal Conjugate-13 02/25/2014  . Pneumococcal Polysaccharide-23 02/14/2007  . Td 04/30/2008  . Zoster 09/10/2013    Past Medical History:  Diagnosis Date  . Allergy, unspecified not elsewhere classified   . Benign prostatic hypertrophy 08/24/2013  . Chronic kidney disease, stage III (moderate) (HCC)    patient denies  . Chronic respiratory failure with hypoxia (HCC) 12/05/2016  . Dyspnea on exertion 04/10/2013  . External hemorrhoids without mention of complication   . Gout, unspecified   . Hypertension   . Hypertrophy of prostate without urinary obstruction and other lower urinary tract symptoms (LUTS)   . Interstitial lung disease (HCC) 10/29/2014  . Maculopapular rash, generalized 10/02/2012  . Obesity, unspecified   . On home oxygen therapy    in the morning and evening, with exertion, as needed when O2 drops to 80s  . Organic sleep apnea, unspecified    CPAP, presure setting 3  . OSA (obstructive sleep apnea) 10/09/2013  . Osteoarthrosis, unspecified whether  generalized or localized, unspecified site   . Other abnormal glucose   . Other and unspecified hyperlipidemia   . Other atopic dermatitis and related conditions   . Other specified visual disturbances   . Pain in joint, lower leg   . Pain in limb   . Primary osteoarthritis of right knee 10/29/2014  . 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 Type II or unspecified type diabetes mellitus without mention of complication, not stated as uncontrolled 08/24/2013  . Unspecified disorder of kidney and ureter   . Unspecified essential hypertension   . Urticaria, unspecified     Tobacco History: Social History   Tobacco Use  Smoking Status Former Smoker  . Packs/day: 0.50  . Years: 15.00  . Pack years: 7.50  . Types: Cigarettes  . Quit date: 04/30/1993  . Years since quitting: 25.5  Smokeless Tobacco Former NeurosurgeonUser  . Types: Chew   Counseling given: Yes  Continue to not smoke  Outpatient Encounter Medications as of 11/06/2018  Medication Sig  . allopurinol (ZYLOPRIM) 100 MG tablet Take 1 tablet (100 mg total) by mouth daily. For gout  . atorvastatin (LIPITOR) 80 MG tablet TAKE ONE TABLET BY MOUTH ONE TIME DAILY  (Patient taking differently: Take 80 mg by mouth daily. )  . cetirizine (KLS ALLER-TEC) 10 MG tablet Take 10 mg by mouth daily as needed (for allergies.).   Marland Kitchen. furosemide (LASIX) 20 MG tablet Take 1  tablet (20 mg total) by mouth daily. For swelling of legs  . losartan (COZAAR) 100 MG tablet TAKE ONE TABLET BY MOUTH ONE TIME DAILY  (Patient taking differently: Take 100 mg by mouth daily. )  . metoprolol succinate (TOPROL-XL) 50 MG 24 hr tablet Take 1 tablet (50 mg total) by mouth daily.  . Multiple Vitamin (MULTIVITAMIN WITH MINERALS) TABS tablet Take 1 tablet by mouth daily.  . tamsulosin (FLOMAX) 0.4 MG CAPS capsule TAKE 1 CAPSULE BY MOUTH ONCE A DAY FOR YOUR PROSTATE (Patient taking differently: Take 0.4 mg by mouth daily after supper. )  . acetaminophen (TYLENOL) 325  MG tablet Take 650 mg by mouth. Take two tablets (650 mg) every 6 hours as needed for pain  . albuterol (PROVENTIL HFA;VENTOLIN HFA) 108 (90 Base) MCG/ACT inhaler Inhale 2 puffs into the lungs. Into lungs every 6 hours as needed for wheezing or shortness of breath or cough  . polyvinyl alcohol (LUBRICANT DROPS) 1.4 % ophthalmic solution Place 1 drop into both eyes 3 (three) times daily as needed for dry eyes.   No facility-administered encounter medications on file as of 11/06/2018.      Review of Systems  Review of Systems  Constitutional: Positive for fatigue. Negative for activity change, chills, fever and unexpected weight change.  HENT: Negative for postnasal drip, rhinorrhea, sinus pressure, sinus pain and sore throat.   Eyes: Negative.   Respiratory: Positive for shortness of breath. Negative for cough and wheezing.   Cardiovascular: Negative for chest pain and palpitations.  Gastrointestinal: Negative for diarrhea, nausea and vomiting.  Endocrine: Negative.   Genitourinary: Negative.   Musculoskeletal: Negative.   Skin: Negative.   Neurological: Negative for dizziness and headaches.  Psychiatric/Behavioral: Negative.  Negative for dysphoric mood. The patient is not nervous/anxious.   All other systems reviewed and are negative.    Physical Exam  BP 130/66 (BP Location: Left Arm, Patient Position: Sitting, Cuff Size: Normal)   Pulse 60   Ht 5\' 5"  (1.651 m)   Wt 158 lb (71.7 kg)   SpO2 93%   BMI 26.29 kg/m   Wt Readings from Last 5 Encounters:  11/06/18 158 lb (71.7 kg)  11/03/18 159 lb 3.2 oz (72.2 kg)  11/13/17 165 lb (74.8 kg)  01/14/17 165 lb (74.8 kg)  12/24/16 165 lb 12.8 oz (75.2 kg)   3 L pulsed  Physical Exam Vitals signs and nursing note reviewed.  Constitutional:      General: He is not in acute distress.    Appearance: Normal appearance.     Interventions: Nasal cannula in place.     Comments: 3 L pulsed  HENT:     Head: Normocephalic and  atraumatic.     Right Ear: Hearing, tympanic membrane, ear canal and external ear normal.     Left Ear: Hearing, tympanic membrane, ear canal and external ear normal.     Nose: Nose normal. No mucosal edema or rhinorrhea.     Right Turbinates: Not enlarged.     Left Turbinates: Not enlarged.     Mouth/Throat:     Mouth: Mucous membranes are dry.     Pharynx: Oropharynx is clear. No oropharyngeal exudate.  Eyes:     Pupils: Pupils are equal, round, and reactive to light.  Neck:     Musculoskeletal: Normal range of motion.  Cardiovascular:     Rate and Rhythm: Normal rate and regular rhythm.     Pulses: Normal pulses.     Heart sounds:  Normal heart sounds. No murmur.  Pulmonary:     Effort: Pulmonary effort is normal.     Breath sounds: Normal breath sounds. No decreased breath sounds, wheezing or rales.  Musculoskeletal:     Right lower leg: No edema.     Left lower leg: No edema.  Lymphadenopathy:     Cervical: No cervical adenopathy.  Skin:    General: Skin is warm and dry.     Capillary Refill: Capillary refill takes less than 2 seconds.     Findings: No erythema or rash.  Neurological:     General: No focal deficit present.     Mental Status: He is alert and oriented to person, place, and time.     Motor: No weakness.     Coordination: Coordination normal.     Gait: Gait is intact. Gait normal.     Comments: Tolerated walk, required high amount of oxygen needs unexpected, patient needing 6 L O2 continuous  Psychiatric:        Mood and Affect: Mood normal.        Behavior: Behavior normal. Behavior is cooperative.        Thought Content: Thought content normal.        Judgment: Judgment normal.      Lab Results:  CBC    Component Value Date/Time   WBC 7.8 11/02/2018 2221   RBC 6.35 (H) 11/02/2018 2221   HGB 15.1 11/02/2018 2221   HGB 13.8 07/20/2015 0947   HCT 48.5 11/02/2018 2221   HCT 44.2 07/20/2015 0947   PLT 163 11/02/2018 2221   PLT 208 07/20/2015 0947    MCV 76.4 (L) 11/02/2018 2221   MCV 75 (L) 07/20/2015 0947   MCH 23.8 (L) 11/02/2018 2221   MCHC 31.1 11/02/2018 2221   RDW 17.4 (H) 11/02/2018 2221   RDW 20.3 (H) 07/20/2015 0947   LYMPHSABS 1.8 11/26/2016 0853   LYMPHSABS 2.4 07/20/2015 0947   MONOABS 1.0 11/26/2016 0853   EOSABS 0.5 11/26/2016 0853   EOSABS 0.7 (H) 07/20/2015 0947   BASOSABS 0.0 11/26/2016 0853   BASOSABS 0.0 07/20/2015 0947    BMET    Component Value Date/Time   NA 141 11/03/2018 0459   NA 138 12/11/2016   K 4.5 11/03/2018 0459   CL 110 11/03/2018 0459   CO2 22 11/03/2018 0459   GLUCOSE 94 11/03/2018 0459   BUN 27 (H) 11/03/2018 0459   BUN 21 12/11/2016   CREATININE 2.07 (H) 11/03/2018 0459   CREATININE 1.55 (H) 12/27/2015 0959   CALCIUM 8.5 (L) 11/03/2018 0459   GFRNONAA 30 (L) 11/03/2018 0459   GFRAA 34 (L) 11/03/2018 0459    BNP No results found for: BNP  ProBNP No results found for: PROBNP    Assessment & Plan:   ILD (interstitial lung disease) ([place]) 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, scattered stable considered benign pulmonary nodules 2017 pulmonary function test showing FVC of 2.55 (76% predicted), DLCO 32 Patient was last seen in our office in 2019 when he was was to complete a 67-month follow-up with a high-resolution CT chest as well as spirometry with DLCO Patient spouse who is with him today reports that they completed a CT with [facility] we do not have these records Patient maintained on 3 L continuous Breath sounds clear to auscultation today  Plan: We will request records from [facility] We will request CT results  from [facility], if not a high resolution chest CT done within the last 6 months we will need to repeat We will schedule patient for 4-week follow-up with Dr. Marchelle Gearing with spirometry and [facility]    Surgical  counseling visit Plan: Okay to complete dental procedure for local anesthetic and extractions Low risk for pulmonary complications due to procedure being with local anesthetic Patient still needs to complete follow-up with primary care next week as patient is status post hospitalization     Peri-operative Assessment of Pulmonary Risk for Non-Thoracic Surgery:  ForMr. Grosso, risk of perioperative pulmonary complications is increased by:  Age greater than 65 years  Known ILD  Chronic respiratory failure  Respiratory complications generally occur in 1% of ASA Class I patients, 5% of ASA Class II and 10% of ASA Class III-IV patients These complications rarely result in mortality and iclude postoperative pneumonia, atelectasis, pulmonary embolism, ARDS and increased time requiring postoperative mechanical ventilation.  Overall, I recommend proceeding with the surgery if the risk for respiratory complications are outweighed by the potential benefits. This will need to be discussed between the patient and surgeon.  To reduce risks of respiratory complications, I recommend: --Pre- and post-operative incentive spirometry performed frequently while awake --Continue oxygen as already prescribed --Avoiding use of pancuronium during anesthesia.  I have discussed the risk factors and recommendations above with the patient.   Chronic respiratory failure with hypoxia [facility]) Assessment: 2017 spirometry with DLCO shows a DLCO of 32 2017 high-resolution CT chest results shows potential NSIP, questionable scattered subpleural reticular changes Walk today in office patient required 6 L continuous  Plan: Oxygen setting should be 3 L continuous at rest, 6 L with exertion Patient reporting he completed a CT with [facility] we will request those results 4-week follow-up with Dr. Marchelle Gearing  OSA (obstructive sleep apnea) Plan: Continue CPAP    Return in about 4 weeks (around 12/04/2018),  or if symptoms worsen or fail to improve, for Follow up with Dr. Dalbert Mayotte, Follow up for Spirometry.   Coral Ceo, NP 11/06/2018   This appointment was 28 minutes long with over 50% of the time in direct face-to-face patient care, assessment, plan of care, and follow-up.

## 2018-11-06 NOTE — Patient Instructions (Addendum)
Walk in office today  >>> Oxygen levels dropped on walk today.  Required higher amounts of oxygen with physical exertion.  6 L with exertion continuous.  We will request records from your primary care provider [facility], we will also request the CT results that you are seen was completed  Okay to proceed forward with dental surgery with local anesthetic >>> Form signed and handed to patient, also faxed to dental office  You need to also follow-up with primary care next week as planned as you recently discharged from the hospital with medication changes  Continue oxygen therapy as prescribed - 3L continuous at rest, 6L continuous with exertion  >>>maintain oxygen saturations greater than 88 percent  >>>if unable to maintain oxygen saturations please contact the office  >>>do not smoke with oxygen  >>>can use nasal saline gel or nasal saline rinses to moisturize nose if oxygen causes dryness   Return in about 4 weeks (around 12/04/2018), or if symptoms worsen or fail to improve, for Follow up with Dr. Purnell Shoemaker, Follow up for Spirometry.   Coronavirus (COVID-19) Are you at risk?  Are you at risk for the Coronavirus (COVID-19)?  To be considered HIGH RISK for Coronavirus (COVID-19), you have to meet the following criteria:  . Traveled to [place], [place], [place], [place] or [place]; or in the [place] to [place], [place], [place], or [place]; and have fever, cough, and shortness of breath within the last 2 weeks of travel OR . Been in close contact with a person diagnosed with COVID-19 within the last 2 weeks and have fever, cough, and shortness of breath . IF YOU DO NOT MEET THESE CRITERIA, YOU ARE CONSIDERED LOW RISK FOR COVID-19.  What to do if you are HIGH RISK for COVID-19?  Marland Kitchen If you are having a medical emergency, call 911. . Seek medical care right away. Before you go to a doctor's office, urgent care or emergency department, call ahead and tell them about  your recent travel, contact with someone diagnosed with COVID-19, and your symptoms. You should receive instructions from your physician's office regarding next steps of care.  . When you arrive at healthcare provider, tell the healthcare staff immediately you have returned from visiting [place], [place], [place], [place] or [place]; or traveled in the [place] to [place], [place], [place], or [place]; in the last two weeks or you have been in close contact with a person diagnosed with COVID-19 in the last 2 weeks.   . Tell the health care staff about your symptoms: fever, cough and shortness of breath. . After you have been seen by a medical provider, you will be either: o Tested for (COVID-19) and discharged home on quarantine except to seek medical care if symptoms worsen, and asked to  - Stay home and avoid contact with others until you get your results (4-5 days)  - Avoid travel on public transportation if possible (such as bus, train, or airplane) or o Sent to the Emergency Department by EMS for evaluation, COVID-19 testing, and possible admission depending on your condition and test results.  What to do if you are LOW RISK for COVID-19?  Reduce your risk of any infection by using the same precautions used for avoiding the common cold or flu:  Marland Kitchen Wash your hands often with soap and warm water for at least 20 seconds.  If soap and water are not readily available, use an alcohol-based hand sanitizer with at least  60% alcohol.  . If coughing or sneezing, cover your mouth and nose by coughing or sneezing into the elbow areas of your shirt or coat, into a tissue or into your sleeve (not your hands). . Avoid shaking hands with others and consider head nods or verbal greetings only. . Avoid touching your eyes, nose, or mouth with unwashed hands.  . Avoid close contact with people who are sick. . Avoid places or events with large numbers of people in one location, like concerts or sporting  events. . Carefully consider travel plans you have or are making. . If you are planning any travel outside or inside the [place], visit the CDC's Travelers' Health webpage for the latest health notices. . If you have some symptoms but not all symptoms, continue to monitor at home and seek medical attention if your symptoms worsen. . If you are having a medical emergency, call 911.   ADDITIONAL HEALTHCARE OPTIONS FOR PATIENTS  [facility] / e-Visit: [email or web address]         [facility]: [phone]  [facility]: [phone]                   [facility]: [phone]           It is flu season:   >>> Best ways to protect herself from the flu: Receive the yearly flu vaccine, practice good hand hygiene washing with soap and also using hand sanitizer when available, eat a nutritious meals, get adequate rest, hydrate appropriately   Please contact the office if your symptoms worsen or you have concerns that you are not improving.   Thank you for choosing [facility] for your healthcare, and for allowing us to partner with you on your healthcare journey. I am thankful to be able to provide care to you today.   Elisha HeadlandBrian Eiza Canniff FNP-C     Home Oxygen Use, Adult When a medical condition keeps you from getting enough oxygen, your health care provider may instruct you to take extra oxygen at home. Your health care provider will let you know:  When to take oxygen.  For how long to take oxygen.  How quickly oxygen should be delivered (flow rate), in liters per minute (LPM or L/M). Home oxygen can be given through:  A mask.  A nasal cannula. This is a device or tube that goes in the nostrils.  A transtracheal catheter. This is a small, flexible tube placed in the trachea.  A tracheostomy. This is a surgically made opening in the trachea. These devices are connected with tubing to  an oxygen source, such as:  A tank. Tanks hold oxygen in gas form. They must be replaced when the oxygen is used up.  A liquid oxygen device. This holds oxygen in liquid form. It must be replaced when the oxygen is used up.  An oxygen concentrator machine. This filters oxygen in the room. It uses electricity, so you must have a backup cylinder of oxygen in case the power goes out. Supplies needed: To use oxygen, you will need:  A mask, nasal cannula, transtracheal catheter, or tracheostomy.  An oxygen tank, a liquid oxygen device, or an oxygen concentrator.  The tape that your health care provider recommends (optional). If you use a transtracheal catheter and your prescribed flow rate is 1 LPM or greater, you will also need a humidifier. Risks and complications  Fire. This can happen if the oxygen is exposed to a  heat source, flame, or spark.  Injury to skin. This can happen if liquid oxygen touches your skin.  Organ damage. This can happen if you get too little oxygen. How to use oxygen Your health care provider or a representative from your medical device company will show you how to use your oxygen device. Follow her or his instructions. The instructions may look something like this: 1. Wash your hands. 2. If you use an oxygen concentrator, make sure it is plugged in. 3. Place one end of the tube into the port on the tank, device, or machine. 4. Place the mask over your nose and mouth. Or, place the nasal cannula and secure it with tape if instructed. If you use a tracheostomy or transtracheal catheter, connect it to the oxygen source as directed. 5. Make sure the liter-flow setting on the machine is at the level prescribed by your health care provider. 6. Turn on the machine or adjust the knob on the tank or device to the correct liter-flow setting. 7. When you are done, turn off and unplug the machine, or turn the knob to OFF. How to clean and care for the oxygen supplies Nasal  cannula  Clean it with a warm, wet cloth daily or as needed.  Wash it with a liquid soap once a week.  Rinse it thoroughly once or twice a week.  Replace it every 2-4 weeks.  If you have an infection, such as a cold or pneumonia, change the cannula when you get better. Mask  Replace it every 2-4 weeks.  If you have an infection, such as a cold or pneumonia, change the mask when you get better. Humidifier bottle  Wash the bottle between each refill: ? Wash it with soap and warm water. ? Rinse it thoroughly. ? Disinfect it and its top. ? Air-dry it.  Make sure it is dry before you refill it. Oxygen concentrator  Clean the air filter at least twice a week according to directions from your home medical equipment and service company.  Wipe down the cabinet every day. To do this: ? Unplug the unit. ? Wipe down the cabinet with a damp cloth. ? Dry the cabinet. Other equipment  Change any extra tubing every 1-3 months.  Follow instructions from your health care provider about taking care of any other equipment. Safety tips Fire safety tips   Keep your oxygen and oxygen supplies at least 5 ft away from sources of heat, flames, and sparks at all times.  Do not allow smoking near your oxygen. Put up "no smoking" signs in your home. Avoid smoking areas when in public.  Do not use materials that can burn (are flammable) while you use oxygen.  When you go to a restaurant with portable oxygen, ask to be seated in the nonsmoking section.  Keep a Government social research officerfire extinguisher close by. Let your fire department know that you have oxygen in your home.  Test your home smoke detectors regularly. Traveling  Secure your oxygen tank in the vehicle so that it does not move around. Follow instructions from your medical device company about how to safely secure your tank.  Make sure you have enough oxygen for the amount of time you will be away from home.  If you are planning air travel, contact  the airline to find out if they allow the use of an approved portable oxygen concentrator. You may also need documents from your health care provider and medical device company before you travel. General  safety tips  If you use an oxygen cylinder, make sure it is in a stand or secured to an object that will not move (fixed object).  If you use liquid oxygen, make sure its container is kept upright.  If you use an oxygen concentrator: ? Catering managerTell your electric company. Make sure you are given priority service in the event that your power goes out. ? Avoid using extension cords, if possible. Follow these instructions at home:  Use oxygen only as told by your health care provider.  Do not use alcohol or other drugs that make you relax (sedating drugs) unless instructed. They can slow down your breathing rate and make it hard to get in enough oxygen.  Know how and when to order a refill of oxygen.  Always keep a spare tank of oxygen. Plan ahead for holidays when you may not be able to get a prescription filled.  Use water-based lubricants on your lips or nostrils. Do not use oil-based products like petroleum jelly.  To prevent skin irritation on your cheeks or behind your ears, tuck some gauze under the tubing. Contact a health care provider if:  You get headaches often.  You have shortness of breath.  You have a lasting cough.  You have anxiety.  You are sleepy all the time.  You develop an illness that affects your breathing.  You cannot exercise at your regular level.  You are restless.  You have difficult or irregular breathing, and it is getting worse.  You have a fever.  You have persistent redness under your nose. Get help right away if:  You are confused.  You have blue lips or fingernails.  You are struggling to breathe. Summary  Your health care provider or a representative from your medical device company will show you how to use your oxygen device. Follow her  or his instructions.  If you use an oxygen concentrator, make sure it is plugged in.  Make sure the liter-flow setting on the machine is at the level prescribed by your health care provider.  Keep your oxygen and oxygen supplies at least 5 ft away from sources of heat, flames, and sparks at all times. This information is not intended to replace advice given to you by your health care provider. Make sure you discuss any questions you have with your health care provider. Document Released: 07/07/2003 Document Revised: 10/03/2017 Document Reviewed: 11/08/2015 Elsevier Patient Education  2020 [employer]

## 2018-11-07 ENCOUNTER — Telehealth: Payer: Self-pay | Admitting: Pulmonary Disease

## 2018-11-07 DIAGNOSIS — Z09 Encounter for follow-up examination after completed treatment for conditions other than malignant neoplasm: Secondary | ICD-10-CM | POA: Diagnosis not present

## 2018-11-07 DIAGNOSIS — J849 Interstitial pulmonary disease, unspecified: Secondary | ICD-10-CM

## 2018-11-07 DIAGNOSIS — Z1159 Encounter for screening for other viral diseases: Secondary | ICD-10-CM | POA: Diagnosis not present

## 2018-11-07 DIAGNOSIS — M545 Low back pain: Secondary | ICD-10-CM | POA: Diagnosis not present

## 2018-11-07 DIAGNOSIS — J8410 Pulmonary fibrosis, unspecified: Secondary | ICD-10-CM | POA: Diagnosis not present

## 2018-11-07 DIAGNOSIS — G4733 Obstructive sleep apnea (adult) (pediatric): Secondary | ICD-10-CM | POA: Diagnosis not present

## 2018-11-07 DIAGNOSIS — J449 Chronic obstructive pulmonary disease, unspecified: Secondary | ICD-10-CM | POA: Diagnosis not present

## 2018-11-07 DIAGNOSIS — I10 Essential (primary) hypertension: Secondary | ICD-10-CM | POA: Diagnosis not present

## 2018-11-07 DIAGNOSIS — R1084 Generalized abdominal pain: Secondary | ICD-10-CM | POA: Diagnosis not present

## 2018-11-07 NOTE — Telephone Encounter (Signed)
11/07/2018 1550  We received the radiology report from the CT chest the patient had completed on 08/15/2018.   08/15/2018-CT chest without contrast- stable 2 mm nodule in right upper lobe, additional nodule now more conspicuous measuring 1 mm in right upper lobe also, no suspicious nodules, no infiltrates, no interstitial lung disease, no bronchiectasis, no bulla formation, stable bibasilar scarring   Please ask the patient and spouse to obtain the CT imaging disc from [facility] the next time they go to their office.  So we can get these images added to PACS.  For right now we can tentatively plan on a repeat high-resolution CT chest in 6 to 12 months.  Or sooner based off of clinical need.  We will route to Dr. Chase Caller as Juluis Rainier.  Wyn Quaker, FNP

## 2018-11-17 NOTE — Telephone Encounter (Signed)
Sounds good

## 2018-11-17 NOTE — Telephone Encounter (Signed)
Called spoke with spouse who will obtain CT imaging on disc when she next goes to [facility].  Spouse stated they have not heard anything from [facility] regarding O2.  Per Epic, order was received.  Called [facility] and spoke with Claiborne Billings who verified receipt of order and will call patient now.  Nothing further needed at this time; will sign off.

## 2018-11-17 NOTE — Telephone Encounter (Signed)
Thank you Jess.  Noted.  Wyn Quaker, FNP

## 2018-11-25 DIAGNOSIS — J8410 Pulmonary fibrosis, unspecified: Secondary | ICD-10-CM | POA: Diagnosis not present

## 2018-12-08 DIAGNOSIS — G4733 Obstructive sleep apnea (adult) (pediatric): Secondary | ICD-10-CM | POA: Diagnosis not present

## 2018-12-08 DIAGNOSIS — J8410 Pulmonary fibrosis, unspecified: Secondary | ICD-10-CM | POA: Diagnosis not present

## 2018-12-10 DIAGNOSIS — I10 Essential (primary) hypertension: Secondary | ICD-10-CM | POA: Diagnosis not present

## 2018-12-10 DIAGNOSIS — E7800 Pure hypercholesterolemia, unspecified: Secondary | ICD-10-CM | POA: Diagnosis not present

## 2018-12-10 DIAGNOSIS — R42 Dizziness and giddiness: Secondary | ICD-10-CM | POA: Diagnosis not present

## 2018-12-12 ENCOUNTER — Telehealth: Payer: Self-pay | Admitting: Internal Medicine

## 2018-12-24 DIAGNOSIS — H5203 Hypermetropia, bilateral: Secondary | ICD-10-CM | POA: Diagnosis not present

## 2018-12-24 DIAGNOSIS — H524 Presbyopia: Secondary | ICD-10-CM | POA: Diagnosis not present

## 2018-12-24 DIAGNOSIS — H2511 Age-related nuclear cataract, right eye: Secondary | ICD-10-CM | POA: Diagnosis not present

## 2018-12-25 NOTE — Telephone Encounter (Signed)
Called pt - phone picked up - no answered -pr

## 2018-12-26 DIAGNOSIS — J8410 Pulmonary fibrosis, unspecified: Secondary | ICD-10-CM | POA: Diagnosis not present

## 2018-12-26 NOTE — Telephone Encounter (Signed)
ATC pt to schedule pft and f/u w/ MR - pt vm not set up - 3rd attempt - closing encounter -pr

## 2019-01-08 DIAGNOSIS — G4733 Obstructive sleep apnea (adult) (pediatric): Secondary | ICD-10-CM | POA: Diagnosis not present

## 2019-01-08 DIAGNOSIS — J8410 Pulmonary fibrosis, unspecified: Secondary | ICD-10-CM | POA: Diagnosis not present

## 2019-01-26 DIAGNOSIS — J8410 Pulmonary fibrosis, unspecified: Secondary | ICD-10-CM | POA: Diagnosis not present

## 2019-02-06 DIAGNOSIS — Z1159 Encounter for screening for other viral diseases: Secondary | ICD-10-CM | POA: Diagnosis not present

## 2019-02-06 DIAGNOSIS — Z23 Encounter for immunization: Secondary | ICD-10-CM | POA: Diagnosis not present

## 2019-02-06 DIAGNOSIS — J449 Chronic obstructive pulmonary disease, unspecified: Secondary | ICD-10-CM | POA: Diagnosis not present

## 2019-02-06 DIAGNOSIS — I10 Essential (primary) hypertension: Secondary | ICD-10-CM | POA: Diagnosis not present

## 2019-02-06 DIAGNOSIS — E1165 Type 2 diabetes mellitus with hyperglycemia: Secondary | ICD-10-CM | POA: Diagnosis not present

## 2019-02-06 DIAGNOSIS — G4733 Obstructive sleep apnea (adult) (pediatric): Secondary | ICD-10-CM | POA: Diagnosis not present

## 2019-02-07 DIAGNOSIS — G4733 Obstructive sleep apnea (adult) (pediatric): Secondary | ICD-10-CM | POA: Diagnosis not present

## 2019-02-07 DIAGNOSIS — J8410 Pulmonary fibrosis, unspecified: Secondary | ICD-10-CM | POA: Diagnosis not present

## 2019-02-25 DIAGNOSIS — J8410 Pulmonary fibrosis, unspecified: Secondary | ICD-10-CM | POA: Diagnosis not present

## 2019-03-10 DIAGNOSIS — J8410 Pulmonary fibrosis, unspecified: Secondary | ICD-10-CM | POA: Diagnosis not present

## 2019-03-10 DIAGNOSIS — G4733 Obstructive sleep apnea (adult) (pediatric): Secondary | ICD-10-CM | POA: Diagnosis not present

## 2019-03-13 DIAGNOSIS — R000 Tachycardia, unspecified: Secondary | ICD-10-CM | POA: Diagnosis not present

## 2019-03-13 DIAGNOSIS — I499 Cardiac arrhythmia, unspecified: Secondary | ICD-10-CM | POA: Diagnosis not present

## 2019-03-13 DIAGNOSIS — R404 Transient alteration of awareness: Secondary | ICD-10-CM | POA: Diagnosis not present

## 2019-03-13 DIAGNOSIS — Z743 Need for continuous supervision: Secondary | ICD-10-CM | POA: Diagnosis not present

## 2019-03-13 DIAGNOSIS — R1084 Generalized abdominal pain: Secondary | ICD-10-CM | POA: Diagnosis not present

## 2019-03-24 DIAGNOSIS — G4733 Obstructive sleep apnea (adult) (pediatric): Secondary | ICD-10-CM | POA: Diagnosis not present

## 2019-03-28 DIAGNOSIS — J8410 Pulmonary fibrosis, unspecified: Secondary | ICD-10-CM | POA: Diagnosis not present

## 2019-03-31 DEATH — deceased

## 2019-04-13 ENCOUNTER — Other Ambulatory Visit: Payer: Self-pay

## 2019-04-13 NOTE — Patient Outreach (Signed)
[facility]) Care Management  04/13/2019  [name] 1941/03/09 [id number]   Medication Adherence call to Mr. Stanley Sullivan Hipp[facility] Identifiers Verify spoke with patients wife,patient is past due on Losartan 100 mg,wife explain patient is taking 1 tablet daily,she explain they have medication for another two month,she explain they just came back from [place] and brourgh some medication from there and are using it,patient has medication at this time. Mr. Gazda is showing past due under [place].   [place] Management Direct Dial [phone]  Fax [phone] [email or web address]

## 2019-05-19 ENCOUNTER — Other Ambulatory Visit: Payer: Medicare Other

## 2020-08-13 IMAGING — CT CT CERVICAL SPINE WITHOUT CONTRAST
5 of 8 series · 11 of 33 positions shown, 12 images · non-contrast
Comparison: None.

CLINICAL DATA: 78 y/o M; syncopal episode with loss of
consciousness hitting the left side of head.

EXAM:
CT HEAD WITHOUT CONTRAST
CT CERVICAL SPINE WITHOUT CONTRAST
TECHNIQUE: Multidetector CT imaging of the head and cervical spine was
performed following the standard protocol without intravenous
contrast. Multiplanar CT image reconstructions of the cervical spine
were also generated.

[Series 4: head bone · axial · 0.41mm/px · z∈[-38,+14]mm · 2 of 78 slices shown]
[im 26/78  bone]
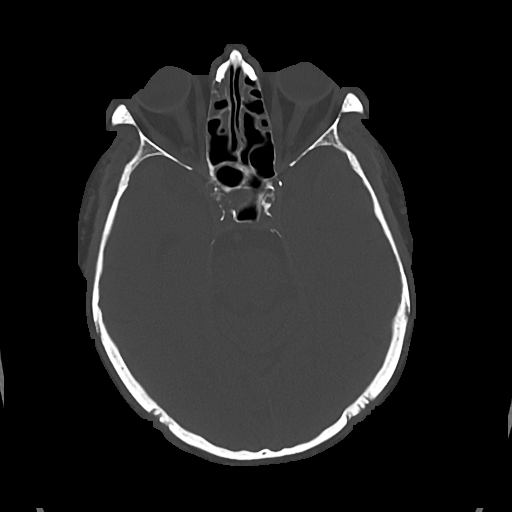
[im 52/78  bone]
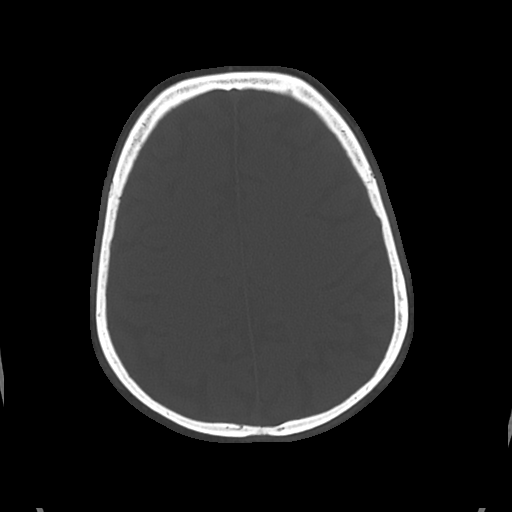

[Series 8: c spine soft · axial · 0.31mm/px · z∈[-152,-102]mm · 2 of 77 slices shown]
[im 26/77  soft-tissue]
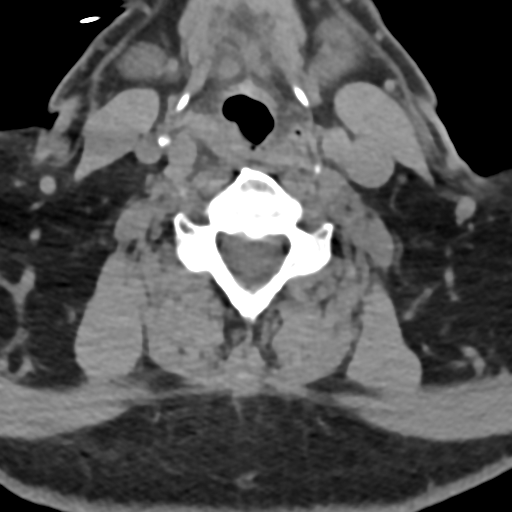
[im 51/77  soft-tissue]
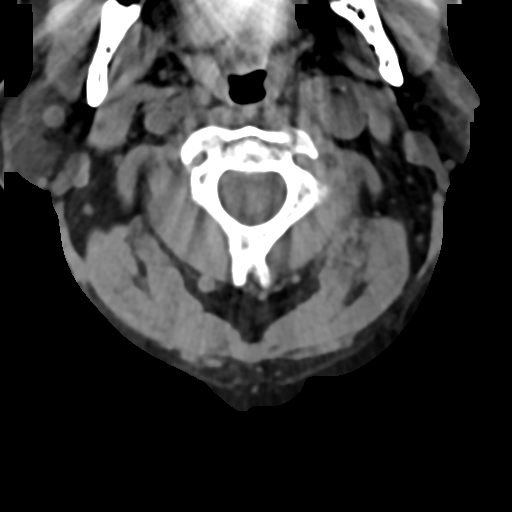

[Series 9: sag bone · sagittal · 0.30mm/px · 4 of 61 slices shown]
[im 13/61  bone]
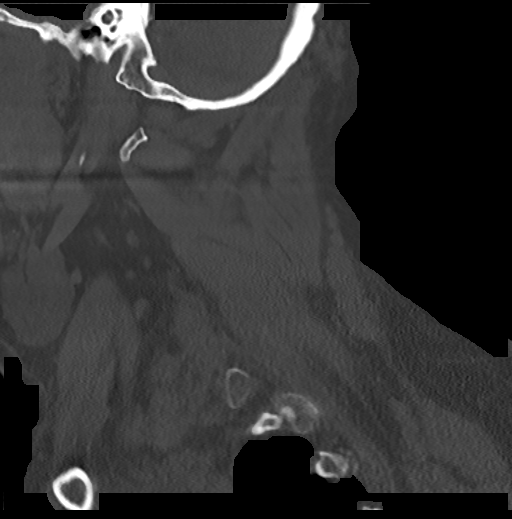
[im 25/61  bone]
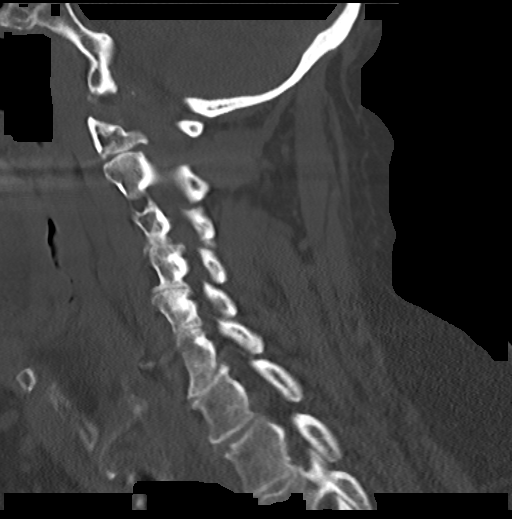
[im 37/61  bone]
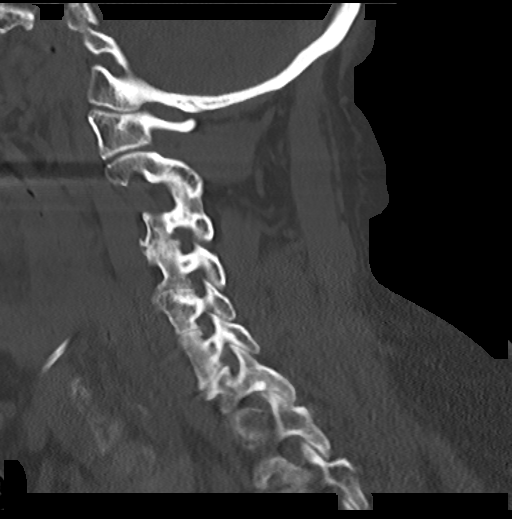
[im 49/61  bone]
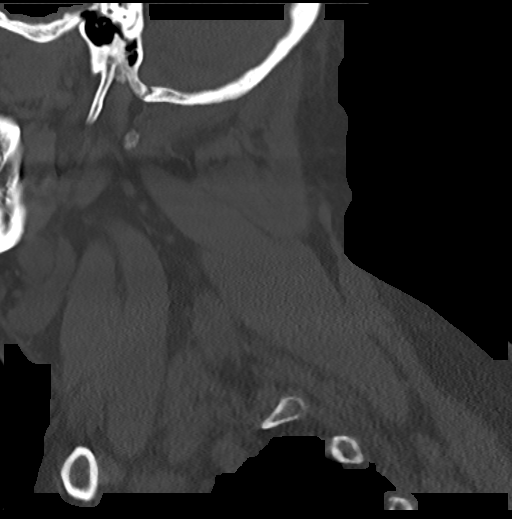

[Series 10: cor bone · coronal · 0.23mm/px · 1 of 63 slices shown]
[im 32/63  bone]
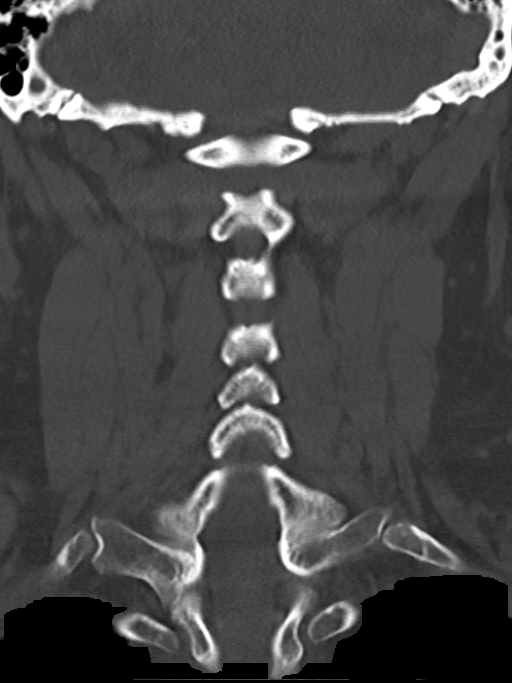

[Series 12: orthogonal axials · axial · 0.21mm/px · z∈[-175,-125]mm · 2 of 78 slices shown, 3 images]
[im 26/78  soft-tissue]
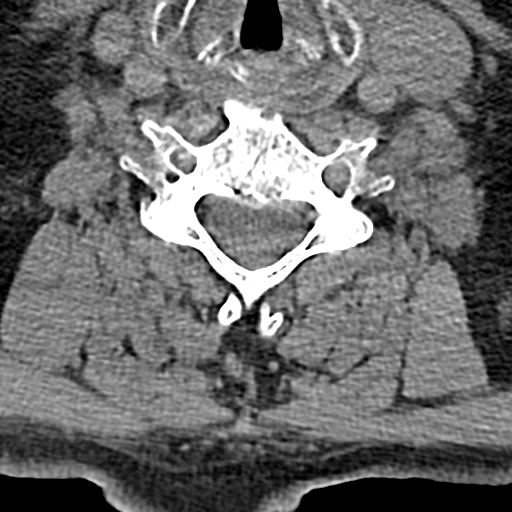
[im 26/78  bone]
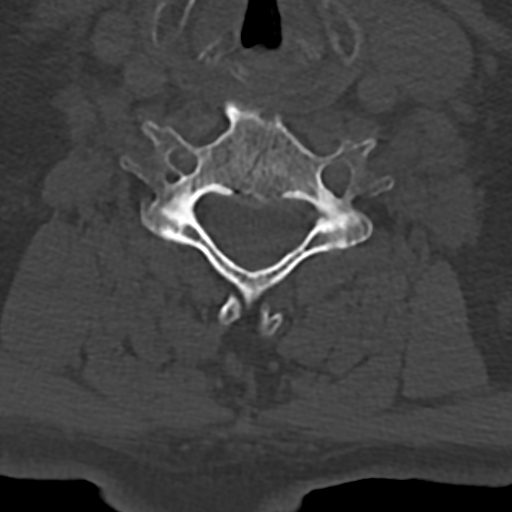
[im 52/78  bone]
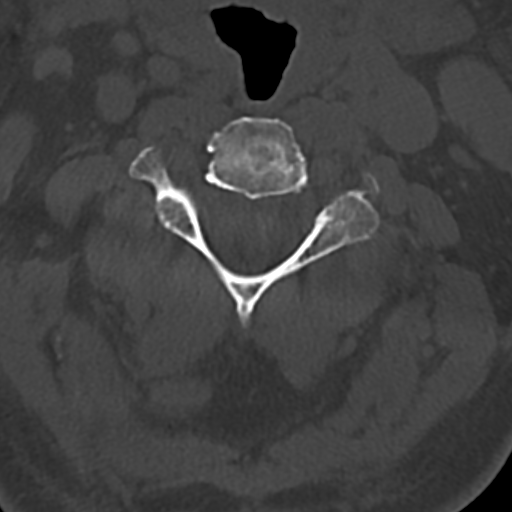

[11 of 33 positions shown; findings below may reference images not displayed]

FINDINGS: CT HEAD FINDINGS

Brain: No evidence of acute infarction, hemorrhage, hydrocephalus,
extra-axial collection or mass lesion/mass effect. Mild volume loss
of the brain.

Vascular: Calcific atherosclerosis of the internal carotid arteries.
No hyperdense vessel.

Skull: Mild contusion of left posterior scalp. No calvarial
fracture.

Sinuses/Orbits: Moderate diffuse paranasal sinus mucosal thickening.
Normal aeration of the mastoid air cells.

Other: None.

CT CERVICAL SPINE FINDINGS

Alignment: Straightening of cervical lordosis without listhesis.

Skull base and vertebrae: No acute fracture. No primary bone lesion
or focal pathologic process.

Soft tissues and spinal canal: No prevertebral fluid or swelling. No
visible canal hematoma.

Disc levels: Moderate cervical spondylosis with predominantly
discogenic degenerative changes. Uncovertebral and facet hypertrophy
encroach on the neural foramen at the bilateral C3-4, bilateral
C4-5, bilateral C5-6, and bilateral C6-7 levels. No high-grade bony
spinal canal stenosis.

Upper chest: Mild biapical pleuroparenchymal scarring.

Other: Negative.
IMPRESSION: 1. Small contusion of left posterior scalp. No calvarial fracture.
2. No acute intracranial abnormality.
3. Mild brain parenchymal volume loss.
4. Moderate paranasal sinus disease.
5. No acute fracture or dislocation of the cervical spine.
6. Moderate cervical spondylosis. No high-grade bony canal stenosis.
